# Patient Record
Sex: Male | Born: 1945
Health system: Southern US, Community
[De-identification: ages and names within clinical notes are randomized; demographics above are authoritative.]

## PROBLEM LIST (undated history)

## (undated) DIAGNOSIS — I480 Paroxysmal atrial fibrillation: Secondary | ICD-10-CM

## (undated) DIAGNOSIS — E669 Obesity, unspecified: Secondary | ICD-10-CM

## (undated) DIAGNOSIS — K219 Gastro-esophageal reflux disease without esophagitis: Secondary | ICD-10-CM

## (undated) DIAGNOSIS — C4490 Unspecified malignant neoplasm of skin, unspecified: Secondary | ICD-10-CM

## (undated) DIAGNOSIS — I509 Heart failure, unspecified: Secondary | ICD-10-CM

## (undated) DIAGNOSIS — I10 Essential (primary) hypertension: Secondary | ICD-10-CM

## (undated) DIAGNOSIS — E785 Hyperlipidemia, unspecified: Secondary | ICD-10-CM

## (undated) DIAGNOSIS — H269 Unspecified cataract: Secondary | ICD-10-CM

## (undated) DIAGNOSIS — T7840XA Allergy, unspecified, initial encounter: Secondary | ICD-10-CM

## (undated) DIAGNOSIS — G4733 Obstructive sleep apnea (adult) (pediatric): Secondary | ICD-10-CM

## (undated) DIAGNOSIS — Z9989 Dependence on other enabling machines and devices: Secondary | ICD-10-CM

## (undated) DIAGNOSIS — J449 Chronic obstructive pulmonary disease, unspecified: Secondary | ICD-10-CM

## (undated) DIAGNOSIS — K5790 Diverticulosis of intestine, part unspecified, without perforation or abscess without bleeding: Secondary | ICD-10-CM

## (undated) DIAGNOSIS — G4730 Sleep apnea, unspecified: Secondary | ICD-10-CM

## (undated) DIAGNOSIS — K5150 Left sided colitis without complications: Secondary | ICD-10-CM

## (undated) HISTORY — PX: SKIN CANCER EXCISION: SHX779

## (undated) HISTORY — DX: Obstructive sleep apnea (adult) (pediatric): G47.33

## (undated) HISTORY — DX: Allergy, unspecified, initial encounter: T78.40XA

## (undated) HISTORY — DX: Dependence on other enabling machines and devices: Z99.89

## (undated) HISTORY — DX: Left sided colitis without complications: K51.50

## (undated) HISTORY — DX: Essential (primary) hypertension: I10

## (undated) HISTORY — DX: Hyperlipidemia, unspecified: E78.5

## (undated) HISTORY — PX: UMBILICAL HERNIA REPAIR: SHX196

## (undated) HISTORY — DX: Unspecified malignant neoplasm of skin, unspecified: C44.90

## (undated) HISTORY — PX: POLYPECTOMY: SHX149

## (undated) HISTORY — PX: OTHER SURGICAL HISTORY: SHX169

## (undated) HISTORY — DX: Gastro-esophageal reflux disease without esophagitis: K21.9

## (undated) HISTORY — DX: Paroxysmal atrial fibrillation: I48.0

## (undated) HISTORY — DX: Sleep apnea, unspecified: G47.30

## (undated) HISTORY — DX: Diverticulosis of intestine, part unspecified, without perforation or abscess without bleeding: K57.90

## (undated) HISTORY — DX: Chronic obstructive pulmonary disease, unspecified: J44.9

## (undated) HISTORY — DX: Unspecified cataract: H26.9

## (undated) HISTORY — DX: Obesity, unspecified: E66.9

## (undated) HISTORY — PX: TONSILLECTOMY: SUR1361

## (undated) HISTORY — PX: BELPHAROPTOSIS REPAIR: SHX369

## (undated) HISTORY — PX: VASECTOMY: SHX75

---

## 1997-11-21 ENCOUNTER — Ambulatory Visit: Admission: RE | Admit: 1997-11-21 | Discharge: 1997-11-21 | Payer: Self-pay

## 1999-02-18 ENCOUNTER — Encounter: Payer: Self-pay | Admitting: Surgery

## 1999-02-18 ENCOUNTER — Encounter: Admission: RE | Admit: 1999-02-18 | Discharge: 1999-02-18 | Payer: Self-pay | Admitting: Surgery

## 1999-02-19 ENCOUNTER — Ambulatory Visit (HOSPITAL_BASED_OUTPATIENT_CLINIC_OR_DEPARTMENT_OTHER): Admission: RE | Admit: 1999-02-19 | Discharge: 1999-02-19 | Payer: Self-pay | Admitting: Surgery

## 1999-04-27 HISTORY — PX: FETAL SURGERY FOR CONGENITAL HERNIA: SHX1618

## 1999-11-15 ENCOUNTER — Ambulatory Visit (HOSPITAL_BASED_OUTPATIENT_CLINIC_OR_DEPARTMENT_OTHER): Admission: RE | Admit: 1999-11-15 | Discharge: 1999-11-15 | Payer: Self-pay | Admitting: Otolaryngology

## 2002-07-20 ENCOUNTER — Ambulatory Visit (HOSPITAL_COMMUNITY): Admission: RE | Admit: 2002-07-20 | Discharge: 2002-07-20 | Payer: Self-pay | Admitting: *Deleted

## 2003-04-27 DIAGNOSIS — I480 Paroxysmal atrial fibrillation: Secondary | ICD-10-CM

## 2003-04-27 HISTORY — DX: Paroxysmal atrial fibrillation: I48.0

## 2003-04-27 HISTORY — PX: BACK SURGERY: SHX140

## 2003-11-08 ENCOUNTER — Inpatient Hospital Stay (HOSPITAL_COMMUNITY): Admission: AC | Admit: 2003-11-08 | Discharge: 2003-12-03 | Payer: Self-pay

## 2003-11-21 ENCOUNTER — Encounter: Payer: Self-pay | Admitting: Cardiology

## 2003-11-26 ENCOUNTER — Encounter (INDEPENDENT_AMBULATORY_CARE_PROVIDER_SITE_OTHER): Payer: Self-pay | Admitting: Specialist

## 2003-12-03 ENCOUNTER — Inpatient Hospital Stay (HOSPITAL_COMMUNITY)
Admission: RE | Admit: 2003-12-03 | Discharge: 2003-12-11 | Payer: Self-pay | Admitting: Physical Medicine & Rehabilitation

## 2004-03-13 ENCOUNTER — Ambulatory Visit: Payer: Self-pay | Admitting: Cardiology

## 2004-05-14 ENCOUNTER — Encounter: Admission: RE | Admit: 2004-05-14 | Discharge: 2004-05-14 | Payer: Self-pay | Admitting: Family Medicine

## 2004-10-02 ENCOUNTER — Ambulatory Visit: Payer: Self-pay | Admitting: Cardiology

## 2006-08-03 ENCOUNTER — Encounter: Admission: RE | Admit: 2006-08-03 | Discharge: 2006-08-03 | Payer: Self-pay | Admitting: Family Medicine

## 2007-03-08 ENCOUNTER — Ambulatory Visit: Payer: Self-pay | Admitting: Internal Medicine

## 2007-03-15 ENCOUNTER — Ambulatory Visit: Payer: Self-pay

## 2007-03-15 LAB — CONVERTED CEMR LAB
ALT: 20 units/L (ref 0–53)
CO2: 29 meq/L (ref 19–32)
CRP, High Sensitivity: 6 — ABNORMAL HIGH (ref 0.00–5.00)
Calcium: 8.8 mg/dL (ref 8.4–10.5)
Chloride: 105 meq/L (ref 96–112)
Cholesterol: 201 mg/dL (ref 0–200)
Creatinine, Ser: 0.9 mg/dL (ref 0.4–1.5)
Direct LDL: 129.3 mg/dL
GFR calc Af Amer: 111 mL/min
GFR calc non Af Amer: 92 mL/min
Glucose, Bld: 104 mg/dL — ABNORMAL HIGH (ref 70–99)
Sodium: 140 meq/L (ref 135–145)
Total CHOL/HDL Ratio: 6.9
Total Protein: 7.1 g/dL (ref 6.0–8.3)
Triglycerides: 261 mg/dL (ref 0–149)
VLDL: 52 mg/dL — ABNORMAL HIGH (ref 0–40)

## 2007-03-16 ENCOUNTER — Ambulatory Visit: Payer: Self-pay | Admitting: Internal Medicine

## 2007-03-16 ENCOUNTER — Encounter: Payer: Self-pay | Admitting: Emergency Medicine

## 2007-03-20 ENCOUNTER — Telehealth (INDEPENDENT_AMBULATORY_CARE_PROVIDER_SITE_OTHER): Payer: Self-pay | Admitting: *Deleted

## 2007-05-04 ENCOUNTER — Ambulatory Visit: Payer: Self-pay | Admitting: Internal Medicine

## 2007-05-12 ENCOUNTER — Ambulatory Visit: Payer: Self-pay | Admitting: Internal Medicine

## 2007-05-12 LAB — CONVERTED CEMR LAB
BUN: 16 mg/dL (ref 6–23)
CO2: 34 meq/L — ABNORMAL HIGH (ref 19–32)
Creatinine, Ser: 1.2 mg/dL (ref 0.4–1.5)
GFR calc non Af Amer: 66 mL/min
Sodium: 140 meq/L (ref 135–145)

## 2007-06-21 ENCOUNTER — Ambulatory Visit: Payer: Self-pay | Admitting: Internal Medicine

## 2007-08-25 ENCOUNTER — Ambulatory Visit: Payer: Self-pay | Admitting: Internal Medicine

## 2007-08-28 ENCOUNTER — Ambulatory Visit: Payer: Self-pay | Admitting: Internal Medicine

## 2007-08-28 LAB — CONVERTED CEMR LAB
ALT: 24 units/L (ref 0–53)
AST: 22 units/L (ref 0–37)
Albumin: 3.8 g/dL (ref 3.5–5.2)
Alkaline Phosphatase: 39 units/L (ref 39–117)
BUN: 22 mg/dL (ref 6–23)
Bilirubin, Direct: 0.1 mg/dL (ref 0.0–0.3)
CRP, High Sensitivity: 5 (ref 0.00–5.00)
Calcium: 9 mg/dL (ref 8.4–10.5)
Chloride: 105 meq/L (ref 96–112)
Cholesterol: 143 mg/dL (ref 0–200)
Creatinine, Ser: 1.1 mg/dL (ref 0.4–1.5)
GFR calc Af Amer: 88 mL/min
Glucose, Bld: 104 mg/dL — ABNORMAL HIGH (ref 70–99)
Potassium: 3.9 meq/L (ref 3.5–5.1)
Total Bilirubin: 0.8 mg/dL (ref 0.3–1.2)
Total Protein: 6.8 g/dL (ref 6.0–8.3)
VLDL: 32 mg/dL (ref 0–40)

## 2007-09-20 ENCOUNTER — Encounter: Admission: RE | Admit: 2007-09-20 | Discharge: 2007-09-20 | Payer: Self-pay | Admitting: Family Medicine

## 2007-10-04 ENCOUNTER — Encounter: Admission: RE | Admit: 2007-10-04 | Discharge: 2007-10-04 | Payer: Self-pay | Admitting: Family Medicine

## 2007-10-31 ENCOUNTER — Ambulatory Visit (HOSPITAL_BASED_OUTPATIENT_CLINIC_OR_DEPARTMENT_OTHER): Admission: RE | Admit: 2007-10-31 | Discharge: 2007-10-31 | Payer: Self-pay | Admitting: Orthopedic Surgery

## 2008-01-03 ENCOUNTER — Ambulatory Visit: Payer: Self-pay | Admitting: Internal Medicine

## 2008-01-04 ENCOUNTER — Ambulatory Visit: Payer: Self-pay | Admitting: Internal Medicine

## 2008-01-04 LAB — CONVERTED CEMR LAB
AST: 15 units/L (ref 0–37)
BUN: 22 mg/dL (ref 6–23)
CRP, High Sensitivity: 7 — ABNORMAL HIGH (ref 0.00–5.00)
Cholesterol: 138 mg/dL (ref 0–200)
Creatinine, Ser: 1.1 mg/dL (ref 0.4–1.5)
GFR calc non Af Amer: 73 mL/min
HDL: 27.2 mg/dL — ABNORMAL LOW (ref >39.0)
Total Protein: 6.9 g/dL (ref 6.0–8.3)
Triglycerides: 123 mg/dL (ref 0–149)
VLDL: 25 mg/dL (ref 0–40)

## 2008-02-01 ENCOUNTER — Ambulatory Visit: Payer: Self-pay | Admitting: Emergency Medicine

## 2008-02-01 DIAGNOSIS — I4891 Unspecified atrial fibrillation: Secondary | ICD-10-CM | POA: Insufficient documentation

## 2008-02-01 DIAGNOSIS — G4733 Obstructive sleep apnea (adult) (pediatric): Secondary | ICD-10-CM | POA: Insufficient documentation

## 2008-02-01 DIAGNOSIS — I10 Essential (primary) hypertension: Secondary | ICD-10-CM | POA: Insufficient documentation

## 2008-02-01 DIAGNOSIS — J449 Chronic obstructive pulmonary disease, unspecified: Secondary | ICD-10-CM | POA: Insufficient documentation

## 2008-02-01 DIAGNOSIS — E785 Hyperlipidemia, unspecified: Secondary | ICD-10-CM | POA: Insufficient documentation

## 2008-02-01 DIAGNOSIS — J309 Allergic rhinitis, unspecified: Secondary | ICD-10-CM | POA: Insufficient documentation

## 2008-03-06 ENCOUNTER — Ambulatory Visit: Payer: Self-pay | Admitting: Internal Medicine

## 2008-03-06 LAB — CONVERTED CEMR LAB
ALT: 25 units/L (ref 0–53)
AST: 16 units/L (ref 0–37)
Albumin: 3.7 g/dL (ref 3.5–5.2)
Alkaline Phosphatase: 47 units/L (ref 39–117)
Bilirubin, Direct: 0.1 mg/dL (ref 0.0–0.3)
HDL: 35.7 mg/dL — ABNORMAL LOW (ref >39.0)
LDL Cholesterol: 84 mg/dL (ref 0–99)
Total Bilirubin: 0.9 mg/dL (ref 0.3–1.2)
Total Protein: 7 g/dL (ref 6.0–8.3)
VLDL: 23 mg/dL (ref 0–40)

## 2008-03-13 ENCOUNTER — Ambulatory Visit: Payer: Self-pay | Admitting: Emergency Medicine

## 2008-05-15 ENCOUNTER — Ambulatory Visit: Payer: Self-pay | Admitting: Internal Medicine

## 2008-05-15 LAB — CONVERTED CEMR LAB
ALT: 29 units/L (ref 0–53)
Cholesterol: 147 mg/dL (ref 0–200)
HDL: 42.5 mg/dL (ref >39.0)
Total Protein: 6.9 g/dL (ref 6.0–8.3)
Triglycerides: 105 mg/dL (ref 0–149)
VLDL: 21 mg/dL (ref 0–40)

## 2008-05-31 ENCOUNTER — Encounter: Admission: RE | Admit: 2008-05-31 | Discharge: 2008-05-31 | Payer: Self-pay | Admitting: Dermatology

## 2008-10-07 ENCOUNTER — Encounter: Payer: Self-pay | Admitting: Internal Medicine

## 2008-11-19 ENCOUNTER — Ambulatory Visit: Payer: Self-pay | Admitting: Internal Medicine

## 2008-11-19 DIAGNOSIS — R0602 Shortness of breath: Secondary | ICD-10-CM | POA: Insufficient documentation

## 2009-02-10 ENCOUNTER — Encounter: Admission: RE | Admit: 2009-02-10 | Discharge: 2009-02-10 | Payer: Self-pay | Admitting: Gastroenterology

## 2009-03-07 ENCOUNTER — Encounter: Admission: RE | Admit: 2009-03-07 | Discharge: 2009-03-07 | Payer: Self-pay | Admitting: Gastroenterology

## 2009-03-17 ENCOUNTER — Telehealth: Payer: Self-pay | Admitting: Internal Medicine

## 2009-04-08 ENCOUNTER — Encounter: Payer: Self-pay | Admitting: Internal Medicine

## 2009-04-22 ENCOUNTER — Telehealth (INDEPENDENT_AMBULATORY_CARE_PROVIDER_SITE_OTHER): Payer: Self-pay | Admitting: *Deleted

## 2009-05-09 ENCOUNTER — Ambulatory Visit: Payer: Self-pay | Admitting: Emergency Medicine

## 2009-05-12 ENCOUNTER — Telehealth: Payer: Self-pay | Admitting: Emergency Medicine

## 2009-05-29 ENCOUNTER — Ambulatory Visit: Payer: Self-pay | Admitting: Internal Medicine

## 2009-10-08 ENCOUNTER — Encounter: Payer: Self-pay | Admitting: Internal Medicine

## 2009-10-16 ENCOUNTER — Encounter: Admission: RE | Admit: 2009-10-16 | Discharge: 2009-10-16 | Payer: Self-pay | Admitting: Gastroenterology

## 2009-10-27 ENCOUNTER — Inpatient Hospital Stay (HOSPITAL_COMMUNITY): Admission: EM | Admit: 2009-10-27 | Discharge: 2009-11-01 | Payer: Self-pay | Admitting: Emergency Medicine

## 2009-11-10 ENCOUNTER — Ambulatory Visit: Payer: Self-pay | Admitting: Emergency Medicine

## 2009-11-24 ENCOUNTER — Ambulatory Visit: Payer: Self-pay | Admitting: Emergency Medicine

## 2010-02-02 ENCOUNTER — Ambulatory Visit: Payer: Self-pay | Admitting: Emergency Medicine

## 2010-03-01 ENCOUNTER — Encounter: Payer: Self-pay | Admitting: Emergency Medicine

## 2010-03-03 ENCOUNTER — Telehealth: Payer: Self-pay | Admitting: Emergency Medicine

## 2010-03-18 ENCOUNTER — Ambulatory Visit: Payer: Self-pay | Admitting: Internal Medicine

## 2010-03-18 DIAGNOSIS — R0789 Other chest pain: Secondary | ICD-10-CM | POA: Insufficient documentation

## 2010-03-23 ENCOUNTER — Ambulatory Visit: Payer: Self-pay | Admitting: Internal Medicine

## 2010-03-23 DIAGNOSIS — R072 Precordial pain: Secondary | ICD-10-CM | POA: Insufficient documentation

## 2010-03-23 DIAGNOSIS — E7800 Pure hypercholesterolemia, unspecified: Secondary | ICD-10-CM | POA: Insufficient documentation

## 2010-03-31 LAB — CONVERTED CEMR LAB
AST: 21 units/L (ref 0–37)
BUN: 25 mg/dL — ABNORMAL HIGH (ref 6–23)
Cholesterol: 164 mg/dL (ref 0–200)
GFR calc non Af Amer: 70.38 mL/min (ref >60)
LDL Cholesterol: 85 mg/dL (ref 0–99)
Potassium: 4 meq/L (ref 3.5–5.1)
Sodium: 142 meq/L (ref 135–145)
Total Bilirubin: 0.3 mg/dL (ref 0.3–1.2)
VLDL: 33.4 mg/dL (ref 0.0–40.0)

## 2010-04-13 ENCOUNTER — Ambulatory Visit: Payer: Self-pay | Admitting: Internal Medicine

## 2010-04-22 ENCOUNTER — Ambulatory Visit
Admission: RE | Admit: 2010-04-22 | Discharge: 2010-04-22 | Payer: Self-pay | Source: Home / Self Care | Attending: Pulmonary Disease | Admitting: Pulmonary Disease

## 2010-04-22 DIAGNOSIS — J209 Acute bronchitis, unspecified: Secondary | ICD-10-CM | POA: Insufficient documentation

## 2010-04-23 ENCOUNTER — Telehealth: Payer: Self-pay | Admitting: Emergency Medicine

## 2010-05-07 ENCOUNTER — Ambulatory Visit
Admission: RE | Admit: 2010-05-07 | Discharge: 2010-05-07 | Payer: Self-pay | Source: Home / Self Care | Attending: Internal Medicine | Admitting: Internal Medicine

## 2010-05-28 ENCOUNTER — Telehealth: Payer: Self-pay | Admitting: Internal Medicine
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
## 2010-05-28 NOTE — Progress Notes (Signed)
Summary: prescript  Phone Note Call from Patient Call back at [phone]   Caller: Patient Call For: Zeah Germano Summary of Call: need prescript for symbicort written for 90 days and 3 refills wrong quanity was written [facility] Initial call taken by: Gustavus Bryant,  May 12, 2009 10:05 AM  Follow-up for Phone Call        called and spoke to pharmacists and they had entered rx as #1 inhaler with 3 refills. I advised this was incorrect and that it was written #3x3 refills. they have fixed this and the pt has been advised to go and pick up additional inhalers. [place] Bing CMA  May 12, 2009 10:23 AM

## 2010-05-28 NOTE — Miscellaneous (Signed)
Summary: Orders Update   Clinical Lists Changes  Orders: Added new Referral order of DME Referral (DME) - Signed  Appended Document: Orders Update     Clinical Lists Changes  Orders: Added new Test order of T-2 View CXR ([id number]) - Signed

## 2010-05-28 NOTE — Progress Notes (Signed)
Summary: RESULTS - LMTCB x1  Phone Note Call from Patient   Caller: Patient Call For: BYRUM Summary of Call: [place] PAP TEST RESULTS TO DETERMINE PRESSURE. Initial call taken by: Gustavus Bryant,  March 03, 2010 11:44 AM  Follow-up for Phone Call        [facility] TCB x1.  when was titration done? Parke Poisson CNA/MA  March 03, 2010 12:06 PM   Titration was done the week of 02-02-10. Please advsie if you have results of this download.[place] Bing CMA  March 04, 2010 10:53 AM  Pt's titration study confirms new pressure should be 11cmH2O, will order this now. Collene Gobble MD  March 05, 2010 5:48 PM   Follow-up by: Collene Gobble MD,  March 05, 2010 5:48 PM  Additional Follow-up for Phone Call Additional follow up Details #1::        LMOMTCB.Francesca Jewett [facility]  March 06, 2010 8:46 AM   pt returned the call and he is aware per RB of results of the titration study--he is also aware that [facility] will be contacting him  Elita Boone CMA  March 06, 2010 1:59 PM

## 2010-05-28 NOTE — Progress Notes (Signed)
Summary: CXR results  Phone Note Call from Patient Call back at cell [phone]   Caller: Patient Summary of Call: Pt requesting results on CXR from yesterday when seen by RA. I advised pt RA is out of the office but RB will here this PM and can review same. Please advise. Thanks. Iran Planas CMA  April 23, 2010 12:40 PM   Follow-up for Phone Call        Please notify pt that his CXR shows changes consistent with COPD, no evidence for PNA or any other acute changes. Collene Gobble MD  April 23, 2010 2:35 PM  Follow-up by: Collene Gobble MD,  April 23, 2010 2:35 PM  Additional Follow-up for Phone Call Additional follow up Details #1::        called and spoke with pt and he is aware of his cxr results Elita Boone [facility]  April 23, 2010 2:52 PM
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
## 2010-06-02 ENCOUNTER — Encounter: Payer: Self-pay | Admitting: Adult Health

## 2010-06-02 ENCOUNTER — Ambulatory Visit (INDEPENDENT_AMBULATORY_CARE_PROVIDER_SITE_OTHER): Payer: 59 | Admitting: Adult Health

## 2010-06-02 DIAGNOSIS — J449 Chronic obstructive pulmonary disease, unspecified: Secondary | ICD-10-CM

## 2010-06-02 DIAGNOSIS — J209 Acute bronchitis, unspecified: Secondary | ICD-10-CM

## 2010-06-03 NOTE — Progress Notes (Signed)
Summary: rx refill   Phone Note Refill Request Call back at [phone] Message from:  Patient on May 28, 2010 8:43 AM  Refills Requested: Medication #1:  LIPITOR 40 MG TABS Take one tablet by mouth daily. pt wants a 90days supply.   Method Requested: Telephone to Pharmacy    Prescriptions: LIPITOR 40 MG TABS (ATORVASTATIN CALCIUM) Take one tablet by mouth daily.  #90 x 2   Entered by:   Mignon Pine, RMA   Authorized by:   Jolaine Artist, MD, [facility]   Signed by:   Mignon Pine, RMA on 05/28/2010   Method used:   [facility]        [facility] [address] [phone]* (retail)       [address]       [place], [place]  [address]       Ph: [phone] or [phone]       Fax: [phone]   RxID:   [id number]

## 2010-06-04 ENCOUNTER — Encounter: Payer: Self-pay | Admitting: Emergency Medicine
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
## 2010-06-23 NOTE — Letter (Signed)
Summary: Laurence Spates MD/[facility] Kerrie Pleasure MD/[facility]   Imported By: Bubba Hales 06/17/2010 09:26:32  _____________________________________________________________________  External Attachment:    Type:   Image     Comment:   External Document

## 2010-06-30 ENCOUNTER — Encounter: Payer: Self-pay | Admitting: Emergency Medicine

## 2010-06-30 ENCOUNTER — Ambulatory Visit (INDEPENDENT_AMBULATORY_CARE_PROVIDER_SITE_OTHER): Payer: 59 | Admitting: Emergency Medicine

## 2010-06-30 DIAGNOSIS — J449 Chronic obstructive pulmonary disease, unspecified: Secondary | ICD-10-CM

## 2010-06-30 DIAGNOSIS — G4730 Sleep apnea, unspecified: Secondary | ICD-10-CM
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
## 2010-07-12 LAB — CBC
HCT: 35.6 % — ABNORMAL LOW (ref 39.0–52.0)
HCT: 38.1 % — ABNORMAL LOW (ref 39.0–52.0)
Hemoglobin: 12 g/dL — ABNORMAL LOW (ref 13.0–17.0)
Hemoglobin: 12.2 g/dL — ABNORMAL LOW (ref 13.0–17.0)
Hemoglobin: 13 g/dL (ref 13.0–17.0)
Hemoglobin: 14.7 g/dL (ref 13.0–17.0)
MCH: 32 pg (ref 26.0–34.0)
MCH: 32.1 pg (ref 26.0–34.0)
MCH: 32.4 pg (ref 26.0–34.0)
MCH: 33.3 pg (ref 26.0–34.0)
MCHC: 33.8 g/dL (ref 30.0–36.0)
MCHC: 34 g/dL (ref 30.0–36.0)
MCHC: 34.4 g/dL (ref 30.0–36.0)
MCHC: 35.6 g/dL (ref 30.0–36.0)
MCV: 93.5 fL (ref 78.0–100.0)
MCV: 94.2 fL (ref 78.0–100.0)
Platelets: 178 10*3/uL (ref 150–400)
Platelets: 201 10*3/uL (ref 150–400)
RBC: 3.78 MIL/uL — ABNORMAL LOW (ref 4.22–5.81)
RBC: 3.96 MIL/uL — ABNORMAL LOW (ref 4.22–5.81)
RBC: 3.98 MIL/uL — ABNORMAL LOW (ref 4.22–5.81)
RDW: 14.4 % (ref 11.5–15.5)
RDW: 14.4 % (ref 11.5–15.5)
WBC: 13.9 10*3/uL — ABNORMAL HIGH (ref 4.0–10.5)

## 2010-07-12 LAB — BASIC METABOLIC PANEL
BUN: 10 mg/dL (ref 6–23)
BUN: 15 mg/dL (ref 6–23)
CO2: 25 mEq/L (ref 19–32)
CO2: 28 mEq/L (ref 19–32)
CO2: 30 mEq/L (ref 19–32)
Calcium: 8.3 mg/dL — ABNORMAL LOW (ref 8.4–10.5)
Calcium: 8.5 mg/dL (ref 8.4–10.5)
Calcium: 8.7 mg/dL (ref 8.4–10.5)
Calcium: 8.8 mg/dL (ref 8.4–10.5)
Creatinine, Ser: 0.86 mg/dL (ref 0.4–1.5)
Creatinine, Ser: 0.91 mg/dL (ref 0.4–1.5)
GFR calc Af Amer: >60 mL/min (ref >60)
GFR calc Af Amer: >60 mL/min (ref >60)
GFR calc non Af Amer: >60 mL/min (ref >60)
GFR calc non Af Amer: >60 mL/min (ref >60)
Glucose, Bld: 104 mg/dL — ABNORMAL HIGH (ref 70–99)
Glucose, Bld: 112 mg/dL — ABNORMAL HIGH (ref 70–99)
Glucose, Bld: 181 mg/dL — ABNORMAL HIGH (ref 70–99)
Potassium: 3.7 mEq/L (ref 3.5–5.1)
Sodium: 139 mEq/L (ref 135–145)
Sodium: 139 mEq/L (ref 135–145)

## 2010-07-12 LAB — DIFFERENTIAL
Basophils Absolute: 0 10*3/uL (ref 0.0–0.1)
Eosinophils Absolute: 0.1 10*3/uL (ref 0.0–0.7)
Eosinophils Relative: 1 % (ref 0–5)
Eosinophils Relative: 1 % (ref 0–5)
Lymphocytes Relative: 5 % — ABNORMAL LOW (ref 12–46)
Lymphocytes Relative: 6 % — ABNORMAL LOW (ref 12–46)
Lymphs Abs: 0.7 10*3/uL (ref 0.7–4.0)
Lymphs Abs: 0.8 10*3/uL (ref 0.7–4.0)
Monocytes Absolute: 0.5 10*3/uL (ref 0.1–1.0)
Monocytes Absolute: 0.7 10*3/uL (ref 0.1–1.0)
Monocytes Relative: 3 % (ref 3–12)
Monocytes Relative: 4 % (ref 3–12)
Monocytes Relative: 4 % (ref 3–12)
Neutro Abs: 15.4 10*3/uL — ABNORMAL HIGH (ref 1.7–7.7)
Neutrophils Relative %: 89 % — ABNORMAL HIGH (ref 43–77)
Neutrophils Relative %: 90 % — ABNORMAL HIGH (ref 43–77)
Neutrophils Relative %: 90 % — ABNORMAL HIGH (ref 43–77)
WBC Morphology: INCREASED

## 2010-07-12 LAB — CULTURE, BLOOD (ROUTINE X 2): Culture: NO GROWTH

## 2010-07-12 LAB — PROCALCITONIN: Procalcitonin: 0.78 ng/mL

## 2010-07-12 LAB — COMPREHENSIVE METABOLIC PANEL
AST: 32 U/L (ref 0–37)
BUN: 27 mg/dL — ABNORMAL HIGH (ref 6–23)
CO2: 24 mEq/L (ref 19–32)
Calcium: 8.5 mg/dL (ref 8.4–10.5)
Creatinine, Ser: 1.29 mg/dL (ref 0.4–1.5)
GFR calc Af Amer: >60 mL/min (ref >60)
GFR calc non Af Amer: 56 mL/min — ABNORMAL LOW (ref >60)
Glucose, Bld: 135 mg/dL — ABNORMAL HIGH (ref 70–99)

## 2010-07-12 LAB — LACTIC ACID, PLASMA: Lactic Acid, Venous: 1.8 mmol/L (ref 0.5–2.2)

## 2010-07-12 LAB — VANCOMYCIN, TROUGH: Vancomycin Tr: 14.1 ug/mL (ref 10.0–20.0)
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
## 2010-09-08 NOTE — Op Note (Signed)
[name], Ricardo Cooper             ACCOUNT NO.:  [id number]   MEDICAL RECORD NO.:  [id number]          PATIENT TYPE:  AMB   LOCATION:  [place]                          FACILITY:  [facility]   PHYSICIAN:  Robert A. Noemi Chapel, M.D. DATE OF BIRTH:  01/08/1946   DATE OF PROCEDURE:  10/31/2007  DATE OF DISCHARGE:                               OPERATIVE REPORT   PREOPERATIVE DIAGNOSIS:  Left knee medial and lateral meniscal tears  with synovitis.   POSTOPERATIVE DIAGNOSIS:  Left knee medial and lateral meniscal tears  with synovitis.   PROCEDURE:  1. Left knee examination under anesthesia followed by arthroscopic      partial medial and lateral meniscectomies.  2. Left knee partial synovectomy.   SURGEON:  Audree Camel. Noemi Chapel, MD.   ASSISTANT:  None.   ANESTHESIA:  General.   OPERATIVE TIME:  30 minutes.   COMPLICATIONS:  None.   INDICATIONS FOR PROCEDURE:  Ricardo Cooper is a 65 year old gentleman who  has had 3-4 months of increasing left knee pain with examined MRI  documenting meniscal tearing with synovitis.  He has failed conservative  care and is now to undergo arthroscopy.   DESCRIPTION:  Ricardo Cooper was brought to the operating room on October 31, 2007 after a knee block was placed in holding area by anesthesia.  He  was placed on operative table in supine position.  After being placed  under general anesthesia, his left knee was examined.  He had full range  of motion and his knee was stable on ligamentous exam with normal  patellar tracking.  Left leg was prepped using sterile DuraPrep, draped  using sterile technique.  He received Ancef 1 g IV preoperatively for  prophylaxis.  Initially, through an anterolateral portal, the  arthroscope with a pump attached was placed into an anteromedial portal  and arthroscopic probe was placed.  On initial inspection of medial  compartment, the articular cartilage showed grade 1 and 2  chondromalacia.  Medial meniscus showed a tearing in the  posterior  medial horn of which 30% was resected back to stable rim.  Intercondylar  notch was inspected.  Anterior and posterior cruciate ligaments were  normal.  Lateral compartment inspected.  Articular cartilage was normal.  Lateral meniscus showed 20% tear posterolateral corner which was  resected back to stable rim.  Patellofemoral joint showed grade 1 and 2  chondromalacia.  The patella tracked normally.  Medial and lateral  gutters showed significant synovitis which was debrided and the small  synovial bleeders cauterized.  Otherwise, this was free of pathology.  At this point, it was felt that all pathology have been satisfactorily  addressed.  The instruments removed.  Portals closed with 3-0 nylon  suture and injected with 0.25% Marcaine with epinephrine and 4 mg of  morphine.  Sterile dressings were applied.  The patient awakened and  taken to recovery in stable condition.   FOLLOWUP CARE:  Ricardo Cooper will be followed as an outpatient on  Vicodin and Mobic.  He will be seen back in the office in a week for  sutures out and followup.  Robert A. Noemi Chapel, M.D.  Electronically Signed     RAW/MEDQ  D:  10/31/2007  T:  11/01/2007  Job:  [id number]

## 2010-09-08 NOTE — Assessment & Plan Note (Signed)
[name]                            CARDIOLOGY OFFICE NOTE   NAME:Ricardo Cooper, Ricardo Cooper                    MRN:          [id number]  DATE:06/21/2007                            DOB:          1946/03/05    PRIMARY CARE PHYSICIAN:  Ricardo Cooper, M.D. with [facility].   INTERVAL HISTORY:  The patient is a delightful 65 year old male, who is  the father of Ricardo Cooper in the cath lab.  He has a history of  obstructive sleep apnea on CPAP, obesity, hypertension as well as a  history of traumatic accident in 2005 with a burst fracture of L1  complicated by a sternal wound infection and atrial fibrillation which  has not recurred.   We saw him in November with shortness of breath.  He had a stress test  which was normal.  PFTs which showed moderate COPD which I suspect is  the main cause of his dyspnea.   He returns today for routine followup primarily of his blood pressure.  He says his systolic blood pressure has been running anywhere from 115-  140.  He also notes that since we had taken him off of the Toprol, he  occasionally feels his heart rate speeding up and often he has a heart  rate of 100 when he checks his blood pressure.  He has not had any  syncope or presyncope with this.  No chest pain.  He is not exercising  regularly.   CURRENT MEDICATIONS:  1. Protonix 40.  2. Multivitamin.  3. Coenzyme Q-10.  4. B12.  5. Vitamin E.  6. CPAP.  7. Simvastatin 40.  8. Lisinopril/HCTZ 20/20 12.5 a day.  9. Lialda 1.2 gm 2 tablets daily.   PHYSICAL EXAMINATION:  GENERAL:  He is in no acute distress.  He  ambulates around the clinic without any respiratory difficulty.  VITAL SIGNS:  Blood pressure is 138/86 on our machine and then 139/96 on  his machine, weight is 239, heart rate is 97.  HEENT:  Normal.  NECK:  Supple.  There is no JVD.  Carotids are 2+ bilaterally without  bruits.  There is no lymphadenopathy or thyromegaly.  CARDIAC:  He  has distant heart sounds.  He is mildly irregular with an  S4.  Soft 2/6 systolic ejection murmur at the left sternal border.  LUNGS:  Clear with mildly prolonged expiratory phases, no wheezes.  ABDOMEN:  Obese, nontender, nondistended.  No hepatosplenomegaly, no  bruits, no masses.  Good bowel sounds.  EXTREMITIES:  Warm with no cyanosis, clubbing or edema.  No rash.  NEURO:  Alert and oriented x 3.  Cranial nerves II-XII are intact.  Moves all 4 extremities without difficulty.  Affect is pleasant.   DIAGNOSTICS:  EKG shows wandering atrial pacemaker at a rate of 97.  There are occasional PVCs.  Incomplete right bundle branch block.   ASSESSMENT:  1. Hypertension.  Blood pressures are mildly elevated.  Given his      wandering atrial pacemaker on his EKG and mild tachycardia, we will      go ahead and  start him on Cardizem CD 180 mg a day.  He will      continue to follow his blood pressure closely.  2. Chronic obstructive pulmonary disease, this is moderate and stable.      Consider having him see pulmonary at some point.  3. Hyperlipidemia, this is followed by Dr. Tomi Cooper.   DISPOSITION:  Will see him back in followup in 2-3 months for routine  followup.     Ricardo Pascal. Bensimhon, MD  Electronically Signed    DRB/MedQ  DD: 06/21/2007  DT: 06/22/2007  Job #: [id number]

## 2010-09-08 NOTE — Assessment & Plan Note (Signed)
[facility]                            CARDIOLOGY OFFICE NOTE   NAME:Gavitt, MAMORU TAKESHITA                    MRN:          [id number]  DATE:05/04/2007                            DOB:          1946/03/13    PRIMARY CARE PHYSICIAN:  Dr. Rita Ohara with [facility].   INTERVAL HISTORY:  Ricardo Cooper is a delightful 65 year old male who is the  father of [name] in the cath lab.  He has a history of  obstructive sleep apnea, obesity, and hypertension, as well as a history  of traumatic accident in 2005 with a burst fracture of L1 complicated by  a sternal wound infection and atrial fibrillation which has not  recurred.   We first saw him in November with shortness of breath.  He had a stress  test which was normal.  Also had PFTs which showed moderate COPD which I  suspect is the main cause of his dyspnea.   He returns today for routine followup.  He is doing fairly well.  He  still has some mild shortness of breath, but this has not progressed.  No chest pain or lower extremity edema.   CURRENT MEDICATIONS:  1. Protonix 40 a day.  2. Multivitamin.  3. Toprol XL 100 a day.  4. Vitamin E.  5. Asacol.  6. Co-enzyme Q10.  7. CPAP.  8. Simvastatin 40 mg a day.   PHYSICAL EXAMINATION:  He is in no acute distress.  Ambulates around the  clinic without any respiratory difficulty.  Blood pressure is 152/86, heart rate is 80, weight is 240.  HEENT:  Normal.  NECK:  Supple, there is no JVD.  Carotids are 2+ bilaterally without any  bruits.  There is no lymphadenopathy or thyromegaly.  CARDIAC:  PMI is nondisplaced.  He is regular with an S4, no murmur.  LUNGS:  Clear with mildly prolonged expiratory phase, no wheezes.  ABDOMEN:  Marked central obesity, no hepatosplenomegaly or bruits  appreciated, no masses, good bowel sounds.  EXTREMITIES:  Warm with no cyanosis, clubbing, or edema.  No rash.  NEURO:  Alert and oriented x3.  Cranial nerves II-XII are  intact.  Moves  all 4 extremities without difficulty.  Affect is pleasant.   ASSESSMENT:  1. Dyspnea:  I suspect this is due primarily to chronic obstructive      pulmonary disease.  He did not have a good bronchodilator response,      so I do not think that he needs beta agonist at this time.      Continue current therapy.  2. Hypertension:  Blood pressure remains elevated.  Will wean him off      the Toprol and start him on lisinopril 20 hydrochlorothiazide 12.5.      He will keep a blood pressure log for [place], and will bring in his      cuff next month for calibration.  I suspect we will double his dose      at that time.  Will check a BMET in 1 week.  3. Hyperlipidemia and metabolic syndrome:  Encouraged him to lose  weight and exercise.  He recently started a statin, will keep close      tabs on this.  His ERP is elevated.     Shaune Pascal. Bensimhon, MD  Electronically Signed    DRB/MedQ  DD: 05/04/2007  DT: 05/04/2007  Job #: [id number]

## 2010-09-08 NOTE — Assessment & Plan Note (Signed)
[name]                            CARDIOLOGY OFFICE NOTE   NAME:Ricardo Cooper, Ricardo Cooper                    MRN:          [id number]  DATE:01/03/2008                            DOB:          [birth year]    PRIMARY CARE PHYSICIAN:  Vikki Ports, MD   INTERVAL HISTORY:  Ricardo Cooper is a very pleasant 65 year old male who  is a father of [name] in our cath lab.  He has a history of  obstructive sleep apnea on CPAP, obesity, hypertension, COPD and a  history of traumatic accident in 2005 with a burst fracture of L1  complicated by sternal wound infection and atrial fibrillation, which  has not recurred.   He returns for routine followup.  Overall, he is doing quite well.  However, he does note that he gets short of breath repeatedly with just  moderate activity.  He did have a Myoview, which was normal showed no  evidence of ischemia.  We recently started him on statin for his  hyperlipidemia.  He has been following his blood pressures and they have  been very well controlled.  He also has a history of palpitations that  are better on diltiazem.   CURRENT MEDICATIONS:  1. Diltiazem XR 180.  2. Fish oil.  3. Aspirin 81.  4. Lipitor 20.  5. Lialda 1.2 g four times a day for his colitis.  6. AndroGel.  7. Lisinopril/hydrochlorothiazide 20/12.5 a day.   OTHER MEDICATIONS:  Protonix 40 a day, multivitamin, CPAP, Coenzyme Q10,  vitamin E and B12.   PHYSICAL EXAMINATION:  GENERAL:  He is in no acute distress, ambulatory  in the clinic without any respiratory difficulty.  VITALS:  Blood pressure is 116/68, heart rate is 76, and weight is 240.  HEENT:  Normal.  NECK:  Supple and thick.  There is no JVD.  Carotids are 2+ bilaterally  without any bruits.  There is no lymphadenopathy or thyromegaly.  CARDIAC:  Regular rate and rhythm.  No murmurs, rubs, or gallops.  LUNGS:  Clear with a long expiratory phase.  No wheezes.  ABDOMEN:  Obese,  nontender, nondistended.  No hepatosplenomegaly, no  bruits, no masses.  Good bowel sounds.  EXTREMITIES:  Warm with no  cyanosis, clubbing, or edema.  No rash.  NEURO:  Alert, oriented x3.  Cranial nerves II through XII are intact.  Moves all four extremities without difficulty.  Affect is pleasant.   EKG shows normal sinus rhythm at a rate of 76 with incomplete right  bundle-branch block.   Pulmonary function test, FEV1 is 2.0 liters with an FVC of 3.98 liters,  his ratio is 50%, his DLCO is 77% of predicted.   ASSESSMENT AND PLAN:  1. Hypertension, well controlled.  2. Hyperlipidemia.  He is due for a repeat lipid panel.  We will see      how he is doing, goal LDL is less than 100.  3. Chronic obstructive pulmonary disease.  I will start him on      Spiriva, will refer to Dr. Lamonte Sakai to see if he has any other  recommendations to improve his respiratory status.     Shaune Pascal. Bensimhon, MD  Electronically Signed    DRB/MedQ  DD: 01/03/2008  DT: 01/04/2008  Job #: [id number]

## 2010-09-08 NOTE — Assessment & Plan Note (Signed)
[place]                            CARDIOLOGY OFFICE NOTE   NAME:Ricardo Cooper, Ricardo Cooper                    MRN:          [id number]  DATE:03/08/2007                            DOB:          05-Oct-1945    NEW PATIENT EVALUATION   PRIMARY CARE PHYSICIAN:  Dr. Rita Ohara in [place], [place].   REASON FOR VISIT:  Shortness of breath and history of atrial  fibrillation.   HISTORY OF PRESENT ILLNESS:  Barbarann Ehlers is a delightful 65 year old male with  a history of obstructive sleep apnea, obesity, and borderline  hypertension.  Was previously followed by Dr. Dannielle Burn.  He saw Dr. Dannielle Burn  back in 2005 when he fell off of a building and had multiple traumatic  injuries, including a burst fracture of L1.  His postoperative course  was complicated by atrial fibrillation with rapid ventricular response.  He was started on Toprol.  This has not recurred.   Over the past few years, he is doing well.  He notes, however, that he  is getting dyspnea with just moderate exertion, such as going up a few  flights of steps.  This is somewhat chronic, but he thinks it may have  progressed just a little bit.  He has not had any chest pain, orthopnea.  No PND.  He previously smoked for about 30 years, but quit several years  ago.   REVIEW OF SYSTEMS:  He denies any significant palpitations.  He has had  some bright red blood per rectum and mucus, and underwent colonoscopy,  and recently started on Asacol.  He has some arthritis pain and pain and  numbness in his legs from his previous trauma.  No focal neurologic  defects.  No fevers or chills.  He does sleep with CPAP for his sleep  apnea.  The remainder of the review of systems is negative, except as  previously noted on the HPI and problem list.   PROBLEM LIST:  1. History of postoperative atrial fibrillation, now in sinus rhythm.  2. History of polytrauma back in 2005 including a burst fracture of      his L1 and  complicated by a sternal wound infection.  3. Obesity.  4. Borderline hyperlipidemia.  5. Borderline obstructive sleep apnea on CPAP.  6. Allergies.   CURRENT MEDICATIONS:  1. Protonix 40 a day.  2. Multivitamin.  3. Toprol XL 100 mg a day.  4. Vitamin E.  5. Asacol 800 t.i.d.  6. Coenzyme Q-10.  7. Vitamin B12.   ALLERGIES:  No known drug allergies.   SOCIAL HISTORY:  He is divorced.  His daughter is [name] at the  [employer].  He has a history of tobacco for 30 years, quit  just a few years ago.  He currently works for the [employer]   FAMILY HISTORY:  Father died in 2023-01-07 of this past year at age 64.  No  history of coronary artery disease.  He did have atrial fibrillation.  Mother is 2 and she is in the throes of dementia.  He has 1  sister who  is 53 with multiple health problems, but no coronary artery disease.   PHYSICAL EXAM:  He is in no acute distress.  Ambulates around the clinic  without any respiratory difficulty.  Blood pressure 134/82, heart rate 66, weight 241.  HEENT:  Normal.  NECK:  Supple.  No JVD.  Carotids are 2+ bilaterally without any bruits.  There is no lymphadenopathy or thyromegaly.  CARDIAC:  PMI is nondisplaced.  He has a regular rate and rhythm.  No  murmurs, rubs, or gallops.  LUNGS:  Clear with a mildly to moderately prolonged expiratory phase,  consistent with emphysema.  ABDOMEN:  Marked central obesity with no hepatosplenomegaly.  No bruits.  No masses.  Good bowel sounds.  EXTREMITIES:  Warm with no cyanosis, clubbing, or edema.  DP pulses are  2+ in the left and not palpable on the right.  No rash.  NEURO:  He is alert and oriented x3.  Cranial nerves 2-12 are intact.  Moves all 4 extremities without difficulty.  Affect is pleasant.   EKG shows normal sinus rhythm at a rate of 67.  No significant ST-T wave  abnormalities.  He has an incomplete right bundle branch block at 112  ms.    ASSESSMENT:  1. Dyspnea.  I suspect this is predominantly due to chronic      obstructive pulmonary disease, but he does have multiple cardiac      risk factors, and we will go ahead and get a treadmill Myoview to      further evaluate.  I have also asked him to start a baby aspirin.  2. Atrial fibrillation.  This was postoperative and has not occurred.      It does not need further therapy.  3. Hypertension.  Blood pressure is mildly to moderately elevated.  I      suspect he has clear metabolic syndrome.  He will follow up with      Dr. Tomi Bamberger.  4. Probable metabolic syndrome.  We will check high sensitivity CRP as      well as his lipids.  Based on the results of the recent JUPITER      trial, even if his lipids are normal if his CRT is elevated, he      probably would benefit from a Statin for primary prevention and is      getting his LDL down.  5. Probable peripheral arterial disease by exam.  He denies any      claudication.  We have discussed following up with an abdominal      ultrasound and lower extremity ABI, and will consider this in the      future.   DISPOSITION:  We will see him back in several weeks.  I did urge him to  try to be more active.     Shaune Pascal. Bensimhon, MD  Electronically Signed    DRB/MedQ  DD: 03/08/2007  DT: 03/09/2007  Job #: [id number]   cc:   Vikki Ports, M.D.

## 2010-09-11 NOTE — Consult Note (Signed)
Ricardo Cooper, Ricardo Cooper                         ACCOUNT NO.:  [id number]   MEDICAL RECORD NO.:  [id number]                   PATIENT TYPE:  INP   LOCATION:  2308                                 FACILITY:  [facility]   PHYSICIAN:  Marchia Meiers. Vertell Limber, M.D.               DATE OF BIRTH:  December 16, 1945   DATE OF CONSULTATION:  11/08/2003  DATE OF DISCHARGE:                                   CONSULTATION   REASON FOR CONSULTATION:  L1 burst fracture.   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  He complained of right shoulder, chest pain, and shortness  of breath.  He was taken to the [facility] and emergency room chest  x-ray and CT demonstrated right first through twelfth rib fractures.  He  also had an L1 burst fracture which demonstrates 60% canal compromise and  30% vertebral body height loss with left lamina fracture in addition.  There  are no step-offs.  He denies any numbness or tingling or weakness in his  lower extremities.  No problems with bowel or bladder control.  Additionally  he has a right clavicle fracture and a right pneumothorax for which a chest  tube was placed.   PAST MEDICAL HISTORY:  Significant for hypertension.  He is status post an  umbilical hernia repair.   MEDICATIONS:  He takes hydrochlorothiazide.   He has no known drug allergies.   PHYSICAL EXAMINATION:  VITAL SIGNS:  His temperature is 98.9, pulse of 100,  respiratory rate of 22, blood pressure 152/90.  GENERAL:  He is awake, alert, and fully oriented.  His speech is clear and  fluent.  Cranial nerves II-XII are intact to detail testing.  NECK:  Nontender.  EXTREMITIES:  He moves all extremities well with full power without evidence  of any weakness.  BACK AND CHEST:  He is tender at his right thoracic region and also over his  thoracolumbar junction without palpable  deformity.  There is some bruising  in the right outer chest wall.  He has a right chest tube in position.  NEUROLOGICAL:  Sensation is intact in his lower extremities including intact  rectal tone and perineal sensation.  Reflexes are 2 in the biceps, triceps,  brachioradialis; 2 at the knees, 2 at the ankles with toes downgoing to  plantar stimulation.  Neurocerebellar testing is normal.   IMPRESSION:  Ricardo Cooper is a 65 year old man with an L1 burst fracture  with canal compromise of 60% with right first through twelfth rib fractures  and with a right pneumothorax.   PLAN:  The patient will require surgical stabilization of L1 fracture when  stable from a pulmonary standpoint.  The OR is anticipated for November 10, 2003.  This was discussed with the patient and  the patient's family.  He is  to have flat bed rest with logrolling until surgery and with hourly  neurological checks.                                               Marchia Meiers. Vertell Limber, M.D.    JDS/MEDQ  D:  11/08/2003  T:  11/09/2003  Job:  [id number]

## 2010-09-11 NOTE — Consult Note (Signed)
NAMEKASSIUS, BATTISTE NO.:  [id number]   MEDICAL RECORD NO.:  [id number]                   PATIENT TYPE:  INP   LOCATION:  3103                                 FACILITY:  [facility]   PHYSICIAN:  Crissie Reese, M.D.                  DATE OF BIRTH:  19-Feb-1946   DATE OF CONSULTATION:  11/29/2003  DATE OF DISCHARGE:                                   CONSULTATION   REQUESTING PHYSICIAN:  Dr. Nicanor Alcon.   CHIEF COMPLAINT:  Open wound of the neck.   HISTORY OF PRESENT ILLNESS:  A 65 year old man, who is a terminal patient,  who fell off his roof and suffered multiple injuries.  He has gradually  progressed.  Unfortunately, he had TNA delivered through a PICC line that  actually exited from the superior vena cava and he had an infiltration  injury of his neck.  This was drained 3 days ago by Dr. Arlyce Dice.  He has been  on dry dressings to the wound since then.  Opinion requested regarding wound  management at this point.   PAST MEDICAL HISTORY:  Past medical history is positive for hypertension,  otherwise negative.   PHYSICAL EXAMINATION:  INTEGUMENT:  Physical examination is limited to the  integument.  He has an open wound of his neck in the suprasternal area.  It  is several centimeters deep.  There is a fair amount of exudate in the  wound.  There is no evidence of any loculation or any residual abscess.  There is no evidence of any cellulitis.   RECOMMENDATION:  The recommendation would be saline dressings 3 times a day.  Possibly begin a V.A.C. at some point, although this wound likely will close  without any use of a V.A.C.  The wound would be repaired more quickly if  saline dressings were utilized.  We will recheck next week.                                               Crissie Reese, M.D.    DB/MEDQ  D:  11/29/2003  T:  11/30/2003  Job:  [id number]   cc:   Nicanor Alcon, M.D.  [address]  [place]  [place] [address]

## 2010-09-11 NOTE — Discharge Summary (Signed)
Ricardo Cooper, Ricardo Cooper                         ACCOUNT NO.:  [id number]   MEDICAL RECORD NO.:  [id number]                   PATIENT TYPE:  INP   LOCATION:  3024                                 FACILITY:  [facility]   PHYSICIAN:  JAMES WYATT                         DATE OF BIRTH:  [birth year]   DATE OF ADMISSION:  11/08/2003  DATE OF DISCHARGE:  12/03/2003                                 DISCHARGE SUMMARY   CONSULTING PHYSICIAN:  1.  Dr. Marchia Meiers. Vertell Limber.  2.  Dr. Nicanor Alcon.   FINAL DIAGNOSES:  1.  Fall.  2.  Multiple rib fractures on the right side.  3.  Right clavicle fracture.  4.  Thoracic and lumbar transverse process fractures.  5.  L1 burst fracture.  6.  Right pneumothorax.  7.  Extravasation of TNA into chest area.   PROCEDURES:  1.  Insertion of a chest tube in the right chest by Dr. Grandville Silos.  2.  A fusion of L1 burst fracture.  3.  I&D of chest wall extravasation site per Dr. Arlyce Dice.   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  He is complaining  of right-sided shoulder pain and right chest pain and shortness of breath  when he arrived.  A workup was performed.  A head CT and neck CT were  negative.   Chest x-ray showed rib fractures, right clavicle fracture.  A pelvis x-ray  was negative.  Chest CT was positive for multiple posterior rib fractures, T-  spine and transverse process fracture, and L1 burst fracture.  Abdominal CT  was negative.  Because of these findings, the patient was to be admitted.  Dr. Marchia Meiers. Vertell Limber was consulted for the L1 burst fracture.   HOSPITAL COURSE:  The patient was admitted to the hospital, and initially  was put in intensive care.  He was followed.  He had no neurological  deficits noted from the L1 burst fracture.  Dr. Vertell Limber saw the patient.  Subsequently, the patient was taken to the OR for fusion of L1 by Dr. Vertell Limber.  He subsequently  underwent repair of this on July 24, by Dr. Vertell Limber.  He did  develop an ileus which did resolve slowly over time.  He was __________  apparently the TNA, the PICC line that was being used in regard to this  wall, and he received TNA directly through the chest cavity.  This caused an  inflammatory reaction to the chest wall.  He was followed.  It seemed to be  worsening.  Subsequently, Dr. Arlyce Dice was consulted.  He saw the patient, and  eventually the patient was taken to the OR for an I&D of this wound area,  and wet to drys were used.  There was  talk of skin grafting.  Dr. Harlow Mares had  been consulted.  He saw the patient, and no skin grafting or back draining,  bandaging was used at that point.  There was talk of using vacuum dressing,  but I do not believe they used that.  He used wet and dry dressing.  The  patient slowly improved.  His ileus slowing resolved.  His diet was advanced  as tolerated.  He did have elevated white counts, but no fever was noted.  He had thrombocytosis but this did not appear to affect him.  He did have an  abscess culture of that area which showed a few gram-positive cocci.  He had  been on vancomycin.  He continued to progress slowly.  He was given a TLSO  brace and gotten out of bed with physical therapy.  At this point,  rehabilitation consultation was done.  He improved well enough that he was  ready for discharge to rehab on December 04, 2003.  At this point, he was  doing well and having no complaints or problems.  Dr. Harlow Mares had completed a  note that plastic surgery had seen him in consultation.  He noted the wound  was improving somewhat, and at this point, did not need to have a vacuum  dressing.  He was subsequently discharged to rehab in stable condition on  December 04, 2003.      Billey Chang, P.A.                      JAMES WYATT    CL/MEDQ  D:  01/01/2004  T:  01/01/2004  Job:  [id number]
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
## 2010-09-16 ENCOUNTER — Encounter: Payer: Self-pay | Admitting: Internal Medicine

## 2010-09-22 ENCOUNTER — Ambulatory Visit (INDEPENDENT_AMBULATORY_CARE_PROVIDER_SITE_OTHER): Payer: 59 | Admitting: Internal Medicine

## 2010-09-22 ENCOUNTER — Encounter: Payer: Self-pay | Admitting: Internal Medicine

## 2010-09-22 VITALS — BP 122/66 | Ht 71.0 in | Wt 242.1 lb

## 2010-09-22 DIAGNOSIS — I10 Essential (primary) hypertension: Secondary | ICD-10-CM

## 2010-09-22 DIAGNOSIS — E785 Hyperlipidemia, unspecified: Secondary | ICD-10-CM

## 2010-09-22 DIAGNOSIS — R0602 Shortness of breath: Secondary | ICD-10-CM
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
## 2010-09-22 NOTE — Assessment & Plan Note (Signed)
Stable. Most likely due to COPD.

## 2010-09-22 NOTE — Assessment & Plan Note (Signed)
CP resolved. ETT reassuring. Continue risk factor management. Consider cardiac CT if CP recurs.

## 2010-09-22 NOTE — Patient Instructions (Signed)
Your physician recommends that you return for lab work in: tomorrow for fasting labs Your physician recommends that you schedule a follow-up appointment in: 6 months with Dr. Gala Romney

## 2010-09-22 NOTE — Assessment & Plan Note (Signed)
Due for repeat lipids. Will check in am.

## 2010-09-22 NOTE — Assessment & Plan Note (Signed)
Blood pressure well controlled. Continue current regimen.

## 2010-09-23 ENCOUNTER — Other Ambulatory Visit (INDEPENDENT_AMBULATORY_CARE_PROVIDER_SITE_OTHER): Payer: 59 | Admitting: *Deleted

## 2010-09-23 DIAGNOSIS — E785 Hyperlipidemia, unspecified: Secondary | ICD-10-CM

## 2010-09-23 LAB — HEPATIC FUNCTION PANEL
Alkaline Phosphatase: 31 U/L — ABNORMAL LOW (ref 39–117)
Bilirubin, Direct: 0.1 mg/dL (ref 0.0–0.3)
Total Bilirubin: 0.9 mg/dL (ref 0.3–1.2)
Total Protein: 6.5 g/dL (ref 6.0–8.3)

## 2010-09-23 LAB — BASIC METABOLIC PANEL
Chloride: 107 mEq/L (ref 96–112)
Creatinine, Ser: 0.8 mg/dL (ref 0.4–1.5)
Potassium: 3.7 mEq/L (ref 3.5–5.1)

## 2010-09-23 LAB — LIPID PANEL
Cholesterol: 147 mg/dL (ref 0–200)
LDL Cholesterol: 59 mg/dL (ref 0–99)
Total CHOL/HDL Ratio: 2
VLDL: 28 mg/dL (ref 0.0–40.0)

## 2010-09-25 ENCOUNTER — Encounter: Payer: Self-pay | Admitting: *Deleted

## 2010-10-08 ENCOUNTER — Telehealth: Payer: Self-pay | Admitting: Internal Medicine

## 2010-10-08 NOTE — Telephone Encounter (Signed)
Re 2nd opinion for his colitis, said dr bensimhon was going to have nurse set it up

## 2010-10-09 NOTE — Telephone Encounter (Signed)
Per pt he will go to either [facility] or [facility], will discuss w/Dr bensimhon

## 2010-10-14 NOTE — Telephone Encounter (Signed)
I will have to call next week to arrange.

## 2010-10-14 NOTE — Telephone Encounter (Signed)
Status of appt for [facility] or [facility].

## 2010-10-14 NOTE — Telephone Encounter (Signed)
Who do you want to send him to he doesn't care if its [place] or [place]

## 2010-10-14 NOTE — Telephone Encounter (Signed)
Pt aware we are working on referral will call him back when have more info

## 2010-11-11 NOTE — Telephone Encounter (Signed)
Dr Gala Romney stated last week that he had referred pt to [facility], pt states he has not heard from them, will send to Dr Gala Romney for him to f/u

## 2010-11-13 NOTE — Telephone Encounter (Signed)
Pt is aware he will call me back if he doesn't hear from them next week

## 2010-11-13 NOTE — Telephone Encounter (Signed)
I emailed [facility] again today to f/u. Hopefully should hear back soon. They have a 2nd opinion clinic in the GI division which was supposed to contact him already. Apologize for the wait - I had contacted them over 3 weeks ago and thought it was taken care of.

## 2010-12-07 ENCOUNTER — Other Ambulatory Visit: Payer: Self-pay | Admitting: Nurse Practitioner

## 2010-12-29 ENCOUNTER — Encounter (HOSPITAL_COMMUNITY)
Admission: RE | Admit: 2010-12-29 | Discharge: 2010-12-29 | Disposition: A | Discharge status: Home / Self Care | Payer: 59 | Source: Ambulatory Visit | Attending: Gastroenterology | Admitting: Gastroenterology

## 2010-12-29 DIAGNOSIS — K5130 Ulcerative (chronic) rectosigmoiditis without complications: Secondary | ICD-10-CM | POA: Insufficient documentation

## 2011-01-12 ENCOUNTER — Ambulatory Visit: Payer: 59 | Admitting: Emergency Medicine

## 2011-01-12 ENCOUNTER — Encounter (HOSPITAL_COMMUNITY): Payer: 59

## 2011-01-14 ENCOUNTER — Encounter: Payer: Self-pay | Admitting: Emergency Medicine

## 2011-01-14 ENCOUNTER — Ambulatory Visit (INDEPENDENT_AMBULATORY_CARE_PROVIDER_SITE_OTHER): Payer: 59 | Admitting: Emergency Medicine

## 2011-01-14 DIAGNOSIS — Z23 Encounter for immunization: Secondary | ICD-10-CM

## 2011-01-14 DIAGNOSIS — J449 Chronic obstructive pulmonary disease, unspecified: Secondary | ICD-10-CM

## 2011-01-14 DIAGNOSIS — G4730 Sleep apnea, unspecified: Secondary | ICD-10-CM

## 2011-01-14 NOTE — Assessment & Plan Note (Signed)
-   continue CPAP qhs

## 2011-01-14 NOTE — Assessment & Plan Note (Signed)
-   continue symbicort - SABA prn

## 2011-01-14 NOTE — Progress Notes (Signed)
Addended by: Michel Bickers A on: 01/14/2011 12:31 PM   Modules accepted: Orders
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
## 2011-01-14 NOTE — Patient Instructions (Signed)
Please continue your CPAP every night.  Use your Symbicort twice a day.  Use your albuterol inhaler as needed Follow up with Dr Lamonte Sakai in 6 months or sooner if needed Flu shot today

## 2011-01-21 LAB — BASIC METABOLIC PANEL
BUN: 16
Chloride: 99
GFR calc non Af Amer: >60
Glucose, Bld: 115 — ABNORMAL HIGH
Potassium: 4.6
Sodium: 137

## 2011-01-21 LAB — POCT HEMOGLOBIN-HEMACUE: Hemoglobin: 15.1

## 2011-02-09 ENCOUNTER — Encounter (HOSPITAL_COMMUNITY): Payer: 59 | Attending: Gastroenterology

## 2011-02-09 DIAGNOSIS — K5130 Ulcerative (chronic) rectosigmoiditis without complications: Secondary | ICD-10-CM | POA: Insufficient documentation

## 2011-03-31 ENCOUNTER — Other Ambulatory Visit: Payer: Self-pay | Admitting: Internal Medicine

## 2011-04-06 ENCOUNTER — Encounter (HOSPITAL_COMMUNITY): Payer: 59

## 2011-04-21 ENCOUNTER — Telehealth: Payer: Self-pay | Admitting: *Deleted

## 2011-04-21 NOTE — Telephone Encounter (Signed)
Spoke with pt who stated he saw Dr Britt Bolognese at [facility] and had a consult with a surgeon concerning his Crohn's. Called Medical Records at [phone], fax .[phone], who requested a Letterhead fax only to send records. Pt given an appt for 05/03/11 at 2:30pm. He also saw Dr Bosie Clos at [facility].

## 2011-04-21 NOTE — Telephone Encounter (Signed)
Message copied by Florene Glen on Wed Apr 21, 2011  2:27 PM ------      Message from: Beverley Fiedler      Created: Fri Apr 16, 2011  4:05 PM      Regarding: RE: Referral       Sure happy to.      Cornel Werber, pls schedule in new pt slot.      Also, we need his [facility] records prior to the visit.      Thanks      Vonna Kotyk       ----- Message -----         From: Dolores Patty, MD         Sent: 04/16/2011   2:36 PM           To: Erick Blinks, MD      Subject: Referral                                                 Ricardo Cooper is a patient of mine who has been struggling with Crohn's. He is currently being seen at [facility] but would like to be seen closer to home (and he doesn't like he previous [facility] doc). Any chance you can see him? 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 Thanks -dan

## 2011-04-22 NOTE — Telephone Encounter (Signed)
Faxed RELEASE OF MED. INFO to pt's daughter, Vanessa Barbara at the [employer]. She will have her dada sign the form and fax it to [place] at [phone].

## 2011-04-26 ENCOUNTER — Encounter: Payer: Self-pay | Admitting: Internal Medicine

## 2011-04-27 HISTORY — PX: COLONOSCOPY: SHX174

## 2011-04-27 HISTORY — PX: OTHER SURGICAL HISTORY: SHX169

## 2011-04-30 ENCOUNTER — Encounter: Payer: Self-pay | Admitting: Internal Medicine

## 2011-04-30 NOTE — Telephone Encounter (Signed)
Info received, pt has an appt for 05/03/11 at 2:30pm.

## 2011-05-03 ENCOUNTER — Other Ambulatory Visit: Payer: Self-pay | Admitting: Gastroenterology

## 2011-05-03 ENCOUNTER — Encounter: Payer: Self-pay | Admitting: Internal Medicine

## 2011-05-03 ENCOUNTER — Other Ambulatory Visit (INDEPENDENT_AMBULATORY_CARE_PROVIDER_SITE_OTHER): Payer: 59

## 2011-05-03 ENCOUNTER — Ambulatory Visit (INDEPENDENT_AMBULATORY_CARE_PROVIDER_SITE_OTHER): Payer: 59 | Admitting: Internal Medicine

## 2011-05-03 VITALS — BP 108/64 | HR 100 | Ht 71.5 in | Wt 237.0 lb

## 2011-05-03 DIAGNOSIS — K5090 Crohn's disease, unspecified, without complications: Secondary | ICD-10-CM

## 2011-05-03 DIAGNOSIS — K51919 Ulcerative colitis, unspecified with unspecified complications: Secondary | ICD-10-CM | POA: Insufficient documentation

## 2011-05-03 DIAGNOSIS — K5150 Left sided colitis without complications: Secondary | ICD-10-CM

## 2011-05-03 LAB — CBC WITH DIFFERENTIAL/PLATELET
Basophils Absolute: 0.1 10*3/uL (ref 0.0–0.1)
Eosinophils Absolute: 0.3 10*3/uL (ref 0.0–0.7)
HCT: 39.2 % (ref 39.0–52.0)
Hemoglobin: 13.3 g/dL (ref 13.0–17.0)
Lymphs Abs: 0.7 10*3/uL (ref 0.7–4.0)
MCHC: 33.9 g/dL (ref 30.0–36.0)
MCV: 97.7 fl (ref 78.0–100.0)
Monocytes Absolute: 0.7 10*3/uL (ref 0.1–1.0)
Neutro Abs: 3.3 10*3/uL (ref 1.4–7.7)
Platelets: 292 10*3/uL (ref 150.0–400.0)
RDW: 16.7 % — ABNORMAL HIGH (ref 11.5–14.6)

## 2011-05-03 NOTE — Patient Instructions (Signed)
Dr. Rhea Belton would like you to call us back with any questions or concerns regarding Humira.

## 2011-05-03 NOTE — Progress Notes (Addendum)
Subjective:    Patient ID: Ricardo Cooper, male    DOB: September 26, 1945, 66 y.o.   MRN: [id number]  HPI Ricardo Cooper is a 66 yo male with PMH of left-sided ulcerative colitis diagnosed in 2008, COPD, OSA, hypertension, GERD, history of basal cell carcinoma, and history of colon polyps who is seen in consultation at the request of Dr. Gala Romney for medically refractory UC.  The patient has had ulcerative colitis since 2008 and this has been deemed medically refractory. He has been seen by [facility] gastroenterology and colorectal surgery (Dr. Gwenith Spitz amd Dr Epifania Gore, respectively) and by [facility] (Dr. Nonah Mattes).  Today, he reports that he continues to have bloody diarrhea. He is having 4-5 bowel movements per day on good days as many as 6-10 on bad days. He is still having some nocturnal stooling and fecal seepage. He reports frequent lower GI gas. Pain has never been a significant problem for him. He reports no nausea or vomiting. No fevers or chills. His weight has been stable. He denies a history of trouble with anemia. He also denies extraintestinal manifestations including no rash, joint pains, eye symptoms.    He was last seen by Dr. Gwenith Spitz in November of 2012 and at that time his mesalamine preparation was changed to sulfasalazine. He remains on sulfasalazine 1000 mg 3 times a day, along with mercaptopurine 100 mg daily. He is also using hydrocortisone rectal foam. He reports at present, his colitis symptoms are "about as good as they have been for the past year".  He has been deemed medically refractory.  He has been treated with multiple different types of mesalamine preparations, last being Lialda.  He also was on steroids for the better part of 6 months without a great response. And then between August and November 2012 he was initiated on Remicade therapy and after 3 infusions was absolutely no better. Therefore this medication was discontinued.  Per documentation his last colonoscopy was in October  2011 I do not have the specific procedure report to review.  Review of Systems Constitutional: Negative for fever, chills, night sweats, activity change, appetite change and unexpected weight change HEENT: Negative for sore throat, mouth sores and trouble swallowing. Eyes: Negative for visual disturbance Respiratory: Negative for cough, chest tightness and shortness of breath Cardiovascular: Negative for chest pain, palpitations and lower extremity swelling Gastrointestinal: See history of present illness Genitourinary: Negative for dysuria and hematuria. Musculoskeletal: Negative for back pain, arthralgias and myalgias Skin: Negative for rash or color change Neurological: Negative for headaches, weakness, numbness Hematological: Negative for adenopathy, negative for easy bruising/bleeding Psychiatric/behavioral: Negative for depressed mood, negative for anxiety  Current Outpatient Prescriptions  Medication Sig Dispense Refill  . albuterol (PROVENTIL HFA) 108 (90 BASE) MCG/ACT inhaler Inhale 2 puffs into the lungs every 6 (six) hours as needed.        Marland Kitchen aspirin 81 MG tablet Take 81 mg by mouth daily.        . budesonide-formoterol (SYMBICORT) 160-4.5 MCG/ACT inhaler Inhale 2 puffs into the lungs 2 (two) times daily.        . Cholecalciferol (VITAMIN D3) 1000 UNITS CAPS Take 2 capsules by mouth daily.       . Coenzyme Q10 (CO Q-10) 100 MG CAPS Take by mouth daily.        Marland Kitchen diltiazem (DILACOR XR) 180 MG 24 hr capsule TAKE 1 CAPSULE BY MOUTH DAILY  30 capsule  7  . fish oil-omega-3 fatty acids 1000 MG capsule Take 2  g by mouth daily.        . hydrocortisone (PROCTOCORT) 90 MG/DOSE rectal foam Place 1 applicator rectally 1 day or 1 dose.        Marland Kitchen lisinopril-hydrochlorothiazide (PRINZIDE,ZESTORETIC) 20-12.5 MG per tablet TAKE 1 TABLET BY MOUTH ONCE A DAY  90 tablet  3  . mercaptopurine (PURINETHOL) 50 MG tablet 2 by mouth daily      . Multiple Vitamin (MULTIVITAMIN) capsule Take 1 capsule by  mouth daily as needed.       . vitamin B-12 (CYANOCOBALAMIN) 250 MCG tablet Take 250 mcg by mouth daily.         Allergies  Allergen Reactions  . Ibuprofen     REACTION: causes colitis flare  . Sulfonamide Derivatives     REACTION: headaches   Past Medical History  Diagnosis Date  . Allergic rhinitis   . Paroxysmal atrial fibrillation   . Hyperlipidemia   . HTN (hypertension)   . Obstructive sleep apnea on CPAP   . Ulcerative colitis   . COPD (chronic obstructive pulmonary disease)   . UC   . Diverticulosis   . Skin cancer     basel cell  . GERD (gastroesophageal reflux disease)   . Obesity   . Sleep apnea    Past Surgical History  Procedure Date  . Back surgery 2005  . Colon polyps   . Fetal surgery for congenital hernia   . Tonsillectomy   . Infection in chest that had to be cleaned out     Staff Infection   Family History  Problem Relation Age of Onset  . Emphysema Father   . Heart disease Father   . Emphysema Maternal Grandfather   . Heart disease Maternal Grandfather   . Stroke Maternal Grandfather   . Allergies Mother   . Asthma Sister   .       Social History  . Marital Status: Married   Occupational History  . Maintenance Manager     [employer]   Social History Main Topics  . Smoking status: Former Smoker -- 1.0 packs/day for 35 years  . Smokeless tobacco: Never Used   Comment: quit in 2005  . Alcohol Use: Yes, 3-4 drinks/week  . Drug Use: No      Objective:   Physical Exam BP 108/64  Pulse 100  Ht 5' 11.5" (1.816 m)  Wt 237 lb (107.502 kg)  BMI 32.59 kg/m2 Constitutional: Well-developed and well-nourished. No distress. HEENT: Normocephalic and atraumatic. Oropharynx is clear and moist. No oropharyngeal exudate. Conjunctivae are normal. Pupils are equal round and reactive to light. No scleral icterus. Neck: Neck supple. Trachea midline. Cardiovascular: Normal rate, regular rhythm and intact distal pulses. No M/R/G Pulmonary/chest:  Effort normal and breath sounds normal. No wheezing, rales or rhonchi. Abdominal: Soft, nontender, nondistended. Bowel sounds active throughout. There are no masses palpable. No hepatosplenomegaly. Extremities: no clubbing, cyanosis, or edema Lymphadenopathy: No cervical adenopathy noted. Neurological: Alert and oriented to person place and time. Skin: Skin is warm and dry. No rashes noted. Psychiatric: Normal mood and affect. Behavior is normal.  Labs: Prior celiac panel neg.  Thiopurine metabolite panel reportedly checked by [facility], I do not have this result today.    Assessment & Plan:  Ricardo Cooper is a 66 yo male with PMH of left-sided ulcerative colitis diagnosed in 2008, COPD, OSA, hypertension, GERD, history of basal cell carcinoma, and history of colon polyps who is seen in consultation at the request of Dr.  Bensimhon for medically refractory UC.  1. Left-sided UC, medically refractory -- at this point my opinion regarding his ulcerative colitis is in line with Dr. Gwenith Spitz.  The patient has been on 5-ASA therapy, immunomodulators therapy, and biologic therapy without complete or even modest response.  He is still having multiple bloody bowel movements a day indicating active colitis. He still feels that surgery is his absolute last option at this point. We have discussed Humira, and its new indication in ulcerative colitis. This drug is an option for him, but I have told him frankly that I think the chance of him responding a small. That being said, I see little downside into trying this therapy. He already has had a negative PPD and negative viral hepatitis serologies. We did discuss the risks of biologic therapy including infection, malignancy, heart failure, but rare instances of demyelinating disease, and he understands and is willing to proceed. He would like to discuss this year assistance program with his insurance company and ensure that he would qualify. We also discussed referral  for a second colorectal surgeon opinion. He asked my opinion, and my strong recommendation would be Dr. Donnetta Hutching with [facility].  The plan for now will be likely to try Humira therapy and if no response then referral to [facility] to see Dr. Abigail Miyamoto. I will repeat labs today to include CBC, CMP, ESR, and CRP. He will continue sulfasalazine 1 g 3 times a day, mercaptopurine 100 mg daily and hydrocortisone rectal foam. I will question her records from [facility] to ensure his 6-MP is dosed appropriately (thiopurine Metabolite panel).  I'll wait to hear from him once he determines if he will qualify for Humira related pharmaceutical assistance.  Addendum: I received records today by fax. There is documentation of colonoscopy from 07/21/2009 --There was diffuse colitis extending to approximately 25 cm consistent with ulcerative colitis. There was a pedunculated and sessile polyp in the rectum. This polyp was 8 mm and removed with hot snare. There were is also 2 polyps removed from the descending colon and cecum. --Pathology results: Cecum and descending colon polyps = adenomatous polyps, no high-grade dysplasia or malignancy. Rectal polyp = adenomatous polyps, no high-grade dysplasia. Rectal polyp = polypoid inflamed colorectal mucosa with hyperplastic changes consistent with pseudopolyps. Sigmoid biopsy chronic active colitis consistent with IBD  At this time Dr. Bosie Clos recommended recall colonoscopy in 2 years, which would be March 2013 I am also concerned that adenomatous polyp arose in the setting of inflammation/inflammatory bowel disease. This is another reason for repeat colon in March

## 2011-05-04 ENCOUNTER — Telehealth: Payer: Self-pay | Admitting: *Deleted

## 2011-05-04 LAB — COMPREHENSIVE METABOLIC PANEL
ALT: 28 U/L (ref 0–53)
AST: 18 U/L (ref 0–37)
Alkaline Phosphatase: 43 U/L (ref 39–117)
CO2: 29 mEq/L (ref 19–32)
Creatinine, Ser: 1 mg/dL (ref 0.4–1.5)
GFR: 78.12 mL/min (ref >60.00)
Total Bilirubin: 0.5 mg/dL (ref 0.3–1.2)

## 2011-05-04 NOTE — Telephone Encounter (Signed)
Message copied by Florene Glen on Tue May 04, 2011  2:30 PM ------      Message from: Beverley Fiedler      Created: Tue May 04, 2011 10:58 AM       Labs look okay, but reveal inflammation consistent with active UC      We are waiting to hear from him regarding if he will qualify for the Humira Rx benefit prgoram to lessen his copays.

## 2011-05-04 NOTE — Telephone Encounter (Signed)
Informed pt per Dr Rhea Belton, his labs look okay, but reveal inflammation consistent with active UC. Pt stated understanding and hasn't checked on the Humira yet; he will call when he has results.

## 2011-05-05 ENCOUNTER — Telehealth: Payer: Self-pay | Admitting: Internal Medicine

## 2011-05-05 ENCOUNTER — Encounter: Payer: Self-pay | Admitting: Internal Medicine

## 2011-05-05 NOTE — Telephone Encounter (Signed)
Spoke with pt to inform him I called [employer] and Humira is covered/dispensed by [employer], [phone]. He was registered today his co pay for the starter will be $40 as well as as the 2 pen monthly mtce kit. Pt stated understanding and [facility] will contact him.  ( I gave the correct DX Code to [facility] 556.5, but stated Crohn's instead of UC. Called back to correct it ).

## 2011-05-10 ENCOUNTER — Telehealth: Payer: Self-pay | Admitting: Internal Medicine

## 2011-05-10 NOTE — Telephone Encounter (Signed)
Pt reports he has been contacted and the Humira should be shipped tomorrow. We will instruct him on the administration on the 1st time and more often if he needs it; he will call when the drug is received.

## 2011-05-11 ENCOUNTER — Ambulatory Visit: Payer: 59 | Admitting: Internal Medicine

## 2011-05-11 ENCOUNTER — Telehealth: Payer: Self-pay | Admitting: Internal Medicine

## 2011-05-11 NOTE — Telephone Encounter (Signed)
Spoke with pt and he will come in at 1:30pm today for Pam to teach him.

## 2011-05-18 ENCOUNTER — Telehealth: Payer: Self-pay | Admitting: Internal Medicine

## 2011-05-18 NOTE — Telephone Encounter (Signed)
Received copies from [facility],on 05/18/11. Forwarded 64pages to Dr. Donnita Falls review.

## 2011-05-19 ENCOUNTER — Telehealth: Payer: Self-pay | Admitting: *Deleted

## 2011-05-19 NOTE — Telephone Encounter (Signed)
Message copied by Florene Glen on Wed May 19, 2011  1:26 PM ------      Message from: Beverley Fiedler      Created: Wed May 19, 2011 11:47 AM       Aram Beecham,      I know we just started Mr. Mkrtchyan on adalimumab, but after reviewing his records he is due for repeat colonoscopy in March 2013.      Posterior by this point he will have had a positive response to the adalimumab. Please place him on the schedule for colonoscopy in March 2013.

## 2011-05-20 NOTE — Telephone Encounter (Signed)
Pt will call me back when he can see his calendar to schedule a COLON.

## 2011-05-21 NOTE — Telephone Encounter (Signed)
Scheduled pt for PV on 06/17/11 and Direct COLON for 06/28/11 at 11am. Pt states he's doing much better.

## 2011-06-04 ENCOUNTER — Telehealth: Payer: Self-pay | Admitting: Internal Medicine

## 2011-06-07 NOTE — Telephone Encounter (Signed)
Pt called in to expedite his Prior Auth for Humira; states he need the injection by Wednesday, 06/09/11. Found the Prior Auth in Dr [employer]; it was completed and faxed back to [facility] script. Called [phone]  and the rep stated it will take 24 hrs for the approval. I can call them back tomorrow or [facility] at [phone] to see if they can ship it overnight.

## 2011-06-08 NOTE — Telephone Encounter (Signed)
Ricardo Cooper rep, [phone], called from [facility] to report he will not be back until Monday, 06/14/11 and can get [place] some samples then. Left word on his cell that if he knows how to get a sample tomorrow to call back.

## 2011-06-08 NOTE — Telephone Encounter (Signed)
Notified pt I just spent 1 hr trying to get help on his Prior Auth for Humira. I called [employer] at [phone] and the PA was denied stating it was only approved for Crohn's, RA and Psoriasis, not UC.  Called [employer] who stated I would have to write an appeal which could take days or weeks. I called [facility] who suggested I fax an appeal, mark it URGENT and fax it to [phone]. I then called [employer] assistance and got [facility] [employer], Kirsten who will contact [place]  And see why the Starter Kit was approved, but the mtce drug is being denied. I then spoke with pt assistance at [employer] and pt would hve to start a process for assistance which could take a while. I now have a number for the Drug Rep, Lindie Spruce [phone] whom I will try to contact.

## 2011-06-09 ENCOUNTER — Telehealth: Payer: Self-pay | Admitting: Internal Medicine

## 2011-06-09 MED ORDER — AMBULATORY NON FORMULARY MEDICATION: Status: DC

## 2011-06-09 NOTE — Telephone Encounter (Signed)
Pt reports he can't reach his doc in [place] and he needs the Cortifoam refilled. Spoke with  Dr Hilarie Fredrickson and ok to order. Informed pt I sent an appeals letter in for the [facility], but it may take 3 days to hear anything; pt stated understanding.

## 2011-06-09 NOTE — Telephone Encounter (Signed)
Called back to [employer] about prior auth and to see if fax was received. Apparently, I still need to mail in the appeal. Sent to: [facility]   [place], [place] [address]  ATTN: [facility] APPEALS.

## 2011-06-09 NOTE — Telephone Encounter (Signed)
Spoke with [place] at [employer] for Humira. He read an update from rep's note stating the only way to resolve the problem with [facility] is a written appeal to be faxed to [phone] marked URGENT. Will try that. They know of no other way to get a syringe for dose due today since our drug rep is in [place].

## 2011-06-09 NOTE — Telephone Encounter (Signed)
Ordered pt's cortifoam; pt informed

## 2011-06-11 ENCOUNTER — Telehealth: Payer: Self-pay | Admitting: Internal Medicine

## 2011-06-11 NOTE — Telephone Encounter (Signed)
Error

## 2011-06-14 NOTE — Telephone Encounter (Signed)
Notified pt that I spoke with Ricardo Cooper the Humira rep this am and he will try to get [place] samples until we can straighten this mess out; pt stated understanding.

## 2011-06-17 ENCOUNTER — Encounter: Payer: Self-pay | Admitting: Internal Medicine

## 2011-06-17 ENCOUNTER — Ambulatory Visit (AMBULATORY_SURGERY_CENTER): Payer: 59 | Admitting: *Deleted

## 2011-06-17 ENCOUNTER — Telehealth: Payer: Self-pay | Admitting: Internal Medicine

## 2011-06-17 VITALS — Ht 72.0 in | Wt 236.3 lb

## 2011-06-17 DIAGNOSIS — K5150 Left sided colitis without complications: Secondary | ICD-10-CM

## 2011-06-17 MED ORDER — PEG-KCL-NACL-NASULF-NA ASC-C 100 G PO SOLR: ORAL | Status: DC

## 2011-06-17 NOTE — Telephone Encounter (Signed)
lmom for pt to call back

## 2011-06-18 ENCOUNTER — Telehealth: Payer: Self-pay | Admitting: Internal Medicine

## 2011-06-18 NOTE — Telephone Encounter (Signed)
Pt reports he received a letter from [facility] stating his appeal was approved and he was approved for "Back On Track" until 06/24/11 (?). Called [facility] and reordered the mtce dose of the drug for every other week injections. Pt should receive the injections on Tuesday. Lindie Spruce, [employer] rep lmom stating there had been a delay for approvals of samples and they should be shipped out Monday for receipt on Tuesday or Wednesday. Pt reports he doesn't qualify for the $5 copay from [employer], but they did give him a way to retrieve some of his co pay back; pt stated understanding with the above conversation.

## 2011-06-18 NOTE — Telephone Encounter (Signed)
See note from 06-17-11

## 2011-06-24 ENCOUNTER — Telehealth: Payer: Self-pay | Admitting: Internal Medicine

## 2011-06-24 NOTE — Telephone Encounter (Signed)
Maybe 1 reason he is better is related to the medication. I do think he needs it given his complicated hx with UC in the last several years. He should resume every other week as soon as drug is available. Thanks

## 2011-06-24 NOTE — Telephone Encounter (Signed)
Notified pt of Dr Vena Rua advice and he stated understanding. We did get a Humira sample in and I offered him one so he could start today. Pt stated he'll wait one more day until his get here; he will call if not received by tomorrow afternoon.

## 2011-06-24 NOTE — Telephone Encounter (Signed)
Pt reports he never received his Humira; states he's doing well and does he reall need it? Called [facility] [phone] and the order was placed, but no appt was made for drug to be shipped. They contacted the pt and they will ship the drug tomorrow. Dr Hilarie Fredrickson, pt wants to know since he is doing well off the Humira, maybe he doesn't need to start back. I informed him since he got a good response, if he stops and ever starts back again he may make antibodies to the drug. Please advise. Thanks.

## 2011-06-28 ENCOUNTER — Ambulatory Visit (AMBULATORY_SURGERY_CENTER): Payer: 59 | Admitting: Internal Medicine

## 2011-06-28 ENCOUNTER — Encounter: Payer: Self-pay | Admitting: Internal Medicine

## 2011-06-28 VITALS — BP 126/71 | HR 89 | Temp 96.3°F | Resp 18 | Ht 72.0 in | Wt 236.0 lb

## 2011-06-28 DIAGNOSIS — D126 Benign neoplasm of colon, unspecified: Secondary | ICD-10-CM

## 2011-06-28 DIAGNOSIS — K5190 Ulcerative colitis, unspecified, without complications: Secondary | ICD-10-CM

## 2011-06-28 DIAGNOSIS — K635 Polyp of colon: Secondary | ICD-10-CM

## 2011-06-28 DIAGNOSIS — K5150 Left sided colitis without complications: Secondary | ICD-10-CM

## 2011-06-28 MED ORDER — SODIUM CHLORIDE 0.9 % IV SOLN: 500.0000 mL | INTRAVENOUS | Status: DC

## 2011-06-28 NOTE — Progress Notes (Signed)
Patient did not have preoperative order for IV antibiotic SSI prophylaxis. (G8918)  Patient did not experience any of the following events: a burn prior to discharge; a fall within the facility; wrong site/side/patient/procedure/implant event; or a hospital transfer or hospital admission upon discharge from the facility. (G8907)  

## 2011-06-28 NOTE — Patient Instructions (Addendum)
AVOID ALL NSAIDS, (MOTRIN, ALEVE, IBUPROFEN)  CONTINUE YOUR CURRENT MEDICATIONS.  AWAIT PATHOLOGY RESULTS.YOU HAD AN ENDOSCOPIC PROCEDURE TODAY AT [facility]: Refer to the procedure report that was given to you for any specific questions about what was found during the examination.  If the procedure report does not answer your questions, please call your gastroenterologist to clarify.  If you requested that your care partner not be given the details of your procedure findings, then the procedure report has been included in a sealed envelope for you to review at your convenience later.  YOU SHOULD EXPECT: Some feelings of bloating in the abdomen. Passage of more gas than usual.  Walking can help get rid of the air that was put into your GI tract during the procedure and reduce the bloating. If you had a lower endoscopy (such as a colonoscopy or flexible sigmoidoscopy) you may notice spotting of blood in your stool or on the toilet paper. If you underwent a bowel prep for your procedure, then you may not have a normal bowel movement for a few days.  DIET: Your first meal following the procedure should be a light meal and then it is ok to progress to your normal diet.  A half-sandwich or bowl of soup is an example of a good first meal.  Heavy or fried foods are harder to digest and may make you feel nauseous or bloated.  Likewise meals heavy in dairy and vegetables can cause extra gas to form and this can also increase the bloating.  Drink plenty of fluids but you should avoid alcoholic beverages for 24 hours.  ACTIVITY: Your care partner should take you home directly after the procedure.  You should plan to take it easy, moving slowly for the rest of the day.  You can resume normal activity the day after the procedure however you should NOT DRIVE or use heavy machinery for 24 hours (because of the sedation medicines used during the test).    SYMPTOMS TO REPORT IMMEDIATELY: A  gastroenterologist can be reached at any hour.  During normal business hours, 8:30 AM to 5:00 PM Monday through Friday, call [phone].  After hours and on weekends, please call the GI answering service at [phone] who will take a message and have the physician on call contact you.   Following lower endoscopy (colonoscopy or flexible sigmoidoscopy):  Excessive amounts of blood in the stool  Significant tenderness or worsening of abdominal pains  Swelling of the abdomen that is new, acute  Fever of 100F or higher  Following upper endoscopy (EGD)  Vomiting of blood or coffee ground material  New chest pain or pain under the shoulder blades  Painful or persistently difficult swallowing  New shortness of breath  Fever of 100F or higher  Black, tarry-looking stools  FOLLOW UP: If any biopsies were taken you will be contacted by phone or by letter within the next 1-3 weeks.  Call your gastroenterologist if you have not heard about the biopsies in 3 weeks.  Our staff will call the home number listed on your records the next business day following your procedure to check on you and address any questions or concerns that you may have at that time regarding the information given to you following your procedure. This is a courtesy call and so if there is no answer at the home number and we have not heard from you through the emergency physician on call, we will assume that you have  returned to your regular daily activities without incident.  SIGNATURES/CONFIDENTIALITY: You and/or your care partner have signed paperwork which will be entered into your electronic medical record.  These signatures attest to the fact that that the information above on your After Visit Summary has been reviewed and is understood.  Full responsibility of the confidentiality of this discharge information lies with you and/or your care-partner.

## 2011-06-28 NOTE — Op Note (Signed)
[facility] [employer] [place], [place]  [id number]  COLONOSCOPY PROCEDURE REPORT  PATIENT:  Ricardo Cooper, Ricardo Cooper  MR#:  [id number] BIRTHDATE:  1945-07-29, 65 yrs. old  GENDER:  male ENDOSCOPIST:  Lajuan Lines. Pyrtle, MD REF. BY:  Shaune Pascal. Bensimhon, M.D. PROCEDURE DATE:  06/28/2011 PROCEDURE:  Colonoscopy with snare polypectomy, Colonoscopy with multiple cold biopsies ASA CLASS:  Class III INDICATIONS:  evaluation of ulcerative colitis, history of pre-cancerous (adenomatous) colon polyps MEDICATIONS:   These medications were titrated to patient response per physician's verbal order, Versed 10 mg IV, Fentanyl 75 mcg IV  DESCRIPTION OF PROCEDURE:   After the risks benefits and alternatives of the procedure were thoroughly explained, informed consent was obtained.  Digital rectal exam was performed and revealed no rectal masses and perianal dermatitis.   The LB 180AL [id number] endoscope was introduced through the anus and advanced to the cecum, which was identified by both the appendix and ileocecal valve, without limitations.  The quality of the prep was good, using MoviPrep.  The instrument was then slowly withdrawn as the colon was fully examined. <<PROCEDUREIMAGES>>  FINDINGS:  A 10 mm sessile polyp was found in the ascending colon. Polyp was snared, then cauterized with monopolar cautery. Retrieval was successful.  A 5 mm sessile polyp was found in the ascending colon. Polyp was snared without cautery. Retrieval was successful. Two 5 mm sessile polyps were found in the descending colon. Polyps were snared without cautery. Retrieval was successful.  Colitis was found in the sigmoid colon beginning at around 30 cm from the dentate line and extended to the rectum. This was characterized by erythema, loss of normal vascular pattern, ulceration, friability, narrowing, and what is felt to be pseudopolyp formation. Random biopsies were obtained and sent to pathology.  Two sessile  polyps were found in the sigmoid colon arising in an area of colitis. Polyps were 4 - 5 mm and snared, then cauterized with monopolar cautery. Retrieval was successful. There were multiple polyps identified in the recto-sigmoid colon which may represent pseudopolyps. Multiple biopsies were obtained and sent to pathology to rule out adenomatous change.  Moderate diverticulosis was found scattered throughout the colon. Retroflexion in the rectum was not done.   The scope was then withdrawn from the cecum and the procedure completed.  COMPLICATIONS:  None  ENDOSCOPIC IMPRESSION: 1) Sessile polyp in the ascending colon. Removed and sent to pathology. 2) Sessile polyp in the ascending colon. Removed and sent to pathology. 3) 2 Sessile polyps in the descending colon. Removed and sent to pathology. 4) Colitis in the sigmoid colon to rectum 5) Two polyps in the sigmoid colon. Removed and sent to pathology. 6) Polyps, multiple in the recto-sigmoid colon.  Multiple biopsies performed. 7) Moderate diverticulosis throughout the colon  RECOMMENDATIONS: 1) Avoid all NSAIDs 2) Await pathology results. 3) You will receive a letter within 1-2 weeks with the results of your biopsy as well as final recommendations. Please call my office if you have not received a letter after 3 weeks. 4) Continue current medications 5) We will discuss recommendation once pathology results are available.  Lajuan Lines. Hilarie Fredrickson, MD  CC:  The Patient W. Lutricia Feil, MD Jolaine Artist, MD  n. Lorrin MaisLajuan Lines. Pyrtle at 06/28/2011 12:14 PM  Carola Rhine, [phone]

## 2011-06-29 ENCOUNTER — Telehealth: Payer: Self-pay | Admitting: *Deleted

## 2011-06-29 NOTE — Telephone Encounter (Signed)
  Follow up Call-  Call back number 06/28/2011  Post procedure Call Back phone  # [phone] hm  Permission to leave phone message Yes     Patient questions:      Left message on f/u call . Pt had colonoscopy yesterday.

## 2011-07-05 ENCOUNTER — Encounter: Payer: Self-pay | Admitting: Internal Medicine

## 2011-07-07 ENCOUNTER — Other Ambulatory Visit: Payer: Self-pay | Admitting: Internal Medicine

## 2011-07-09 ENCOUNTER — Other Ambulatory Visit: Payer: Self-pay | Admitting: Gastroenterology

## 2011-07-09 MED ORDER — SULFASALAZINE 500 MG PO TBEC: 500.0000 mg | DELAYED_RELEASE_TABLET | Freq: Two times a day (BID) | ORAL | Status: DC

## 2011-07-09 NOTE — Telephone Encounter (Signed)
Refilled pt.'s Sulfazine. 0 refills pharmacy to call pt.

## 2011-07-13 ENCOUNTER — Ambulatory Visit (INDEPENDENT_AMBULATORY_CARE_PROVIDER_SITE_OTHER): Payer: 59 | Admitting: Emergency Medicine

## 2011-07-13 ENCOUNTER — Encounter: Payer: Self-pay | Admitting: Emergency Medicine

## 2011-07-13 VITALS — BP 118/72 | HR 81 | Temp 98.0°F | Ht 72.0 in | Wt 242.2 lb

## 2011-07-13 DIAGNOSIS — J449 Chronic obstructive pulmonary disease, unspecified: Secondary | ICD-10-CM

## 2011-07-13 DIAGNOSIS — K5150 Left sided colitis without complications: Secondary | ICD-10-CM
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
## 2011-07-13 NOTE — Patient Instructions (Signed)
Please continue your medications as you are taking them  We will perform a CXR next visit Follow with Dr Lamonte Sakai in 6 months or sooner if you have any problems Call our office if you have ant problems this Spring with allergies.

## 2011-07-13 NOTE — Assessment & Plan Note (Signed)
On [place] - check CXR next time as screening on immunosuppresion.

## 2011-07-13 NOTE — Assessment & Plan Note (Signed)
Stable -  - continue same regimen - rov in 6 mon

## 2011-07-15 ENCOUNTER — Telehealth: Payer: Self-pay | Admitting: Internal Medicine

## 2011-07-19 ENCOUNTER — Other Ambulatory Visit: Payer: Self-pay | Admitting: Gastroenterology

## 2011-07-19 MED ORDER — SULFASALAZINE 500 MG PO TBEC: 500.0000 mg | DELAYED_RELEASE_TABLET | Freq: Three times a day (TID) | ORAL | Status: DC

## 2011-07-19 NOTE — Telephone Encounter (Signed)
Spoke to pharmacist about medications.

## 2011-07-27 ENCOUNTER — Other Ambulatory Visit: Payer: Self-pay

## 2011-07-27 MED ORDER — DILTIAZEM HCL ER 180 MG PO CP24: 180.0000 mg | ORAL_CAPSULE | Freq: Every day | ORAL | Status: DC

## 2011-07-29 ENCOUNTER — Telehealth: Payer: Self-pay | Admitting: Internal Medicine

## 2011-07-29 NOTE — Telephone Encounter (Signed)
All C[place] faxed to [facility] @  [phone] 07/29/11/KM

## 2011-08-11 ENCOUNTER — Telehealth: Payer: Self-pay | Admitting: *Deleted

## 2011-08-11 NOTE — Telephone Encounter (Signed)
lmom fpr pt to call back.

## 2011-08-11 NOTE — Telephone Encounter (Signed)
Message copied by Florene Glen on Wed Aug 11, 2011 10:28 AM ------      Message from: Florene Glen      Created: Tue Jul 06, 2011  9:30 AM       Needs 8wk f/u after colon 06/30/11

## 2011-08-25 ENCOUNTER — Other Ambulatory Visit: Payer: Self-pay | Admitting: Internal Medicine

## 2011-08-25 NOTE — Telephone Encounter (Signed)
Scheduled pt for his f/u in May.

## 2011-08-26 ENCOUNTER — Other Ambulatory Visit: Payer: Self-pay | Admitting: Internal Medicine

## 2011-08-26 MED ORDER — DILTIAZEM HCL ER 180 MG PO CP24: 180.0000 mg | ORAL_CAPSULE | Freq: Every day | ORAL | Status: DC

## 2011-08-27 ENCOUNTER — Other Ambulatory Visit: Payer: Self-pay | Admitting: Gastroenterology

## 2011-08-31 ENCOUNTER — Other Ambulatory Visit: Payer: Self-pay

## 2011-09-02 ENCOUNTER — Other Ambulatory Visit: Payer: Self-pay | Admitting: Emergency Medicine

## 2011-09-03 ENCOUNTER — Encounter: Payer: Self-pay | Admitting: Internal Medicine

## 2011-09-06 ENCOUNTER — Encounter: Payer: Self-pay | Admitting: Internal Medicine

## 2011-09-06 ENCOUNTER — Ambulatory Visit (INDEPENDENT_AMBULATORY_CARE_PROVIDER_SITE_OTHER): Payer: 59 | Admitting: Internal Medicine

## 2011-09-06 VITALS — BP 96/52 | HR 84 | Ht 72.0 in | Wt 240.2 lb

## 2011-09-06 DIAGNOSIS — K5190 Ulcerative colitis, unspecified, without complications: Secondary | ICD-10-CM

## 2011-09-06 NOTE — Patient Instructions (Signed)
Your physician has requested that you get lab work done and fax the results back to our office.

## 2011-09-06 NOTE — Progress Notes (Addendum)
Subjective:    Patient ID: Ricardo Cooper, male    DOB: December 19, 1945, 66 y.o.   MRN: [id number]  HPI Mr. Oaxaca is a 66 yo male with PMH of left-sided ulcerative colitis diagnosed in 2008, COPD, OSA, hypertension, GERD, history of basal cell carcinoma, and history of colon polyps who is seen in follow-up.  Since last being seen in clinic, he has been started on Humira, which she continues on every other week. He also remains on her mercaptopurine 100 mg daily and sulfasalazine 1 g 3 times a day.  He underwent colonoscopy in March which revealed tubular adenomas without high-grade dysplasia or malignancy in the ascending colon and descending colon, and these areas are uninvolved with chronic active colitis. He did have pseudopolyps in the sigmoid and rectosigmoid which were inflammatory or hyperplastic in nature. He had no adenomatous change in the segments of his colon affected by colitis. He did have ongoing moderate changes of colitis from the rectum to sigmoid colon at 30 cm.  Today, he returns for followup stating that his disease is "livable". He is having about 2 formed stools a day. Occasionally the stools are hard. He is not having diarrhea. He occasionally sees red blood and occasional "rust colored mucus" but this is significantly less rectal bleeding than before. On most days he sees no blood at all. He denies abdominal pain, of no abdominal pain has never been a big issue for him. He's not had fevers or chills. His appetite has remained good. With the Humira, on the last 2 injections he has noted a little irritation and local inflammation at his injection site but this is been self-limited and minor. He is without new complaint.  Review of Systems As per history of present illness, otherwise negative  Current Medications, Allergies, Past Medical History, Past Surgical History, Family History and Social History were reviewed in [employer] record.     Objective:     Physical Exam BP 96/52  Pulse 84  Ht 6' (1.829 m)  Wt 240 lb 4 oz (108.977 kg)  BMI 32.58 kg/m2 Constitutional: Well-developed and well-nourished. No distress. HEENT: Normocephalic and atraumatic. Oropharynx is clear and moist. No oropharyngeal exudate. Conjunctivae are normal. Pupils are equal round and reactive to light. No scleral icterus. Neck: Neck supple. Trachea midline. Cardiovascular: Normal rate, regular rhythm and intact distal pulses. No M/R/G Pulmonary/chest: Effort normal and breath sounds normal. No wheezing, rales or rhonchi. Abdominal: Soft, nontender, nondistended. Bowel sounds active throughout. There are no masses palpable. No hepatosplenomegaly. Extremities: no clubbing, cyanosis, or edema, scattered ecchymosis bilateral upper extremities Lymphadenopathy: No cervical adenopathy noted. Neurological: Alert and oriented to person place and time. Skin: Skin is warm and dry. No rashes noted. Psychiatric: Normal mood and affect. Behavior is normal.     Assessment & Plan:  66 yo male with PMH of left-sided ulcerative colitis diagnosed in 2008, COPD, OSA, hypertension, GERD, history of basal cell carcinoma, and history of colon polyps who is seen in follow-up.  1. UC -- the patient unfortunately, has never been able to achieve endoscopic remission. This is on 5-ASA, immunomodulators therapy, and biologic therapy. Clinically, he is "okay" and has been warning to avoid colectomy if at all possible. He does realize that he may eventually need colectomy to control his disease, but not based on how he feels now. We have discussed how ideally, I would like to strive for endoscopic remission. However, he is already on maximal medical therapy. I think checking  a thiopurine metabolite panel makes sense to ensure that he is on the maximum safe dose of mercaptopurine.  He reports having this done previously at [facility] on his current dose, and I will request this lab result from Dr. Oleta Mouse.  If  we cannot track this down, I think it is worth repeating. We have also discussed his history of adenomatous colon polyps, and the fact that if any adenomatous change arises in his segment of chronic active colitis, then the best therapy at that point we'll likely be colectomy. He understands this recommendation. The adenomas removed on his recent exam were all in segments not affected by his colitis. I. Like to repeat his colonoscopy in one year, which will be March 2014.  He is due for labs, specifically CBC, CMP, ESR and CRP. He has labs coming up with Dr. Brigitte Pulse and we will ask Dr. Brigitte Pulse to include these labs when he draws his panel at his routine annual followup. The results will be faxed back to me. He will call and let me know if symptoms worsen prior to followup, otherwise followup in 3-4 months  Addendum: Received lab results from [facility], dated 12/25/10 [facility] thiopurine metabolite panel 6-TGN 205 (lower likelihood of response).  6-MMPN 8734 (higher risk of hepatotoxicity). TPMT level -- normal activity.  --Based on these findings, we cannot safely raise dose of 6-mp given the 6-MMPN metabolite and the risk of liver toxicity.  However, unfortunately, at this dose his active metabolite (what makes the drug work in [place]), 6-TGN is a bit low. For now, continue current doses of 5-ASA, 6-mp and humira.

## 2011-09-09 ENCOUNTER — Telehealth: Payer: Self-pay | Admitting: Internal Medicine

## 2011-09-09 ENCOUNTER — Telehealth: Payer: Self-pay | Admitting: *Deleted

## 2011-09-09 NOTE — Telephone Encounter (Signed)
Received copies from [place] ,on 09/09/11 . Forwarded 3 pages to Dr.Pyrtle,for review.

## 2011-09-09 NOTE — Telephone Encounter (Signed)
Informed pt of Dr Vena Rua findings and recommendations about his meds; pt stated understanding.

## 2011-09-09 NOTE — Telephone Encounter (Signed)
Message copied by Lance Morin on Thu Sep 09, 2011  4:24 PM ------      Message from: Jerene Bears      Created: Thu Sep 09, 2011  2:59 PM       Let him know, I got the blood work from [employer].      We cannot safely increase his mercaptopurine.  Therefore, he should continue at his current dose of all 3 of this UC meds.      Thanks

## 2011-09-29 ENCOUNTER — Other Ambulatory Visit: Payer: Self-pay | Admitting: Emergency Medicine

## 2011-11-08 ENCOUNTER — Other Ambulatory Visit: Payer: Self-pay | Admitting: Emergency Medicine

## 2011-11-12 ENCOUNTER — Encounter: Payer: Self-pay | Admitting: Internal Medicine

## 2011-11-13 ENCOUNTER — Other Ambulatory Visit: Payer: Self-pay | Admitting: Internal Medicine

## 2011-12-10 ENCOUNTER — Other Ambulatory Visit: Payer: Self-pay | Admitting: Emergency Medicine

## 2012-01-24 ENCOUNTER — Encounter: Payer: Self-pay | Admitting: Emergency Medicine

## 2012-01-24 ENCOUNTER — Ambulatory Visit (INDEPENDENT_AMBULATORY_CARE_PROVIDER_SITE_OTHER): Payer: 59 | Admitting: Emergency Medicine

## 2012-01-24 VITALS — BP 128/70 | HR 86 | Temp 98.4°F | Ht 72.0 in | Wt 240.0 lb

## 2012-01-24 DIAGNOSIS — J449 Chronic obstructive pulmonary disease, unspecified: Secondary | ICD-10-CM

## 2012-01-24 DIAGNOSIS — G4730 Sleep apnea, unspecified: Secondary | ICD-10-CM

## 2012-01-24 NOTE — Patient Instructions (Addendum)
Please continue your Symbicort as you are taking it Keep albuterol available to use if needed Follow with Dr Lamonte Sakai in 1 year or sooner if needed
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
## 2012-01-24 NOTE — Assessment & Plan Note (Signed)
Please continue your Symbicort as you are taking it Keep albuterol available to use if needed Follow with Dr Lamonte Sakai in 1 year or sooner if neede

## 2012-01-24 NOTE — Assessment & Plan Note (Signed)
CPAP.  

## 2012-02-03 ENCOUNTER — Other Ambulatory Visit: Payer: Self-pay | Admitting: Internal Medicine

## 2012-02-03 NOTE — Telephone Encounter (Signed)
Will forward to Lauren as this is a RB patient.

## 2012-02-04 ENCOUNTER — Other Ambulatory Visit: Payer: Self-pay | Admitting: Internal Medicine

## 2012-02-16 ENCOUNTER — Telehealth: Payer: Self-pay | Admitting: Internal Medicine

## 2012-02-16 NOTE — Telephone Encounter (Signed)
Pt reports he will go on Medicare next month and the Humira will cost him >$4K yearly with his co pay and donut hole. He states he thinks he's in remission and was doing better before he went on the Humira. Pt states he has tried Humira before for about 3 months and it didn't help. Please advise; I think he wants to stop the Humira.

## 2012-02-16 NOTE — Telephone Encounter (Signed)
If there is been truly no benefit then we can stop Humira. Colectomy was recommended for him initially both by his IBD physician at [facility] and by me on initial consult. He has remained apposed to this and Humira was in an effort to improve symptoms. We need to be sure he is on schedule for surveillance colonoscopy

## 2012-02-23 ENCOUNTER — Other Ambulatory Visit: Payer: Self-pay | Admitting: Internal Medicine

## 2012-02-23 NOTE — Telephone Encounter (Signed)
Informed pt of Dr Lauro Franklin recommendations. He reports he is meeting with an agent about the Part D drug plan today. We discussed his co pay, etc and he will decide on what to do. Correction in earlier note: pt has tried Remicade in the past with no improvement.

## 2012-03-01 ENCOUNTER — Other Ambulatory Visit (HOSPITAL_COMMUNITY): Payer: Self-pay | Admitting: *Deleted

## 2012-03-01 ENCOUNTER — Other Ambulatory Visit: Payer: Self-pay | Admitting: Internal Medicine

## 2012-03-01 MED ORDER — LISINOPRIL-HYDROCHLOROTHIAZIDE 20-12.5 MG PO TABS: 1.0000 | ORAL_TABLET | Freq: Every day | ORAL | Status: DC

## 2012-03-30 ENCOUNTER — Other Ambulatory Visit: Payer: Self-pay | Admitting: Dermatology

## 2012-03-30 DIAGNOSIS — D0460 Carcinoma in situ of skin of unspecified upper limb, including shoulder: Secondary | ICD-10-CM | POA: Diagnosis not present

## 2012-03-30 DIAGNOSIS — M6530 Trigger finger, unspecified finger: Secondary | ICD-10-CM | POA: Diagnosis not present

## 2012-03-30 DIAGNOSIS — L570 Actinic keratosis: Secondary | ICD-10-CM | POA: Diagnosis not present

## 2012-03-30 DIAGNOSIS — D485 Neoplasm of uncertain behavior of skin: Secondary | ICD-10-CM | POA: Diagnosis not present

## 2012-03-30 DIAGNOSIS — Z85828 Personal history of other malignant neoplasm of skin: Secondary | ICD-10-CM | POA: Diagnosis not present

## 2012-04-11 ENCOUNTER — Other Ambulatory Visit: Payer: Self-pay | Admitting: *Deleted

## 2012-04-11 MED ORDER — DILTIAZEM HCL ER 180 MG PO CP24: 180.0000 mg | ORAL_CAPSULE | Freq: Every day | ORAL | Status: DC

## 2012-04-17 ENCOUNTER — Other Ambulatory Visit (HOSPITAL_COMMUNITY): Payer: Self-pay | Admitting: *Deleted

## 2012-04-17 MED ORDER — LISINOPRIL-HYDROCHLOROTHIAZIDE 20-12.5 MG PO TABS: 1.0000 | ORAL_TABLET | Freq: Every day | ORAL | Status: DC

## 2012-05-10 DIAGNOSIS — D0460 Carcinoma in situ of skin of unspecified upper limb, including shoulder: Secondary | ICD-10-CM | POA: Diagnosis not present

## 2012-06-02 ENCOUNTER — Ambulatory Visit (INDEPENDENT_AMBULATORY_CARE_PROVIDER_SITE_OTHER): Payer: Medicare Other | Admitting: Internal Medicine

## 2012-06-02 ENCOUNTER — Encounter: Payer: Self-pay | Admitting: Internal Medicine

## 2012-06-02 VITALS — BP 114/60 | HR 88 | Ht 71.0 in | Wt 249.6 lb

## 2012-06-02 DIAGNOSIS — G4733 Obstructive sleep apnea (adult) (pediatric): Secondary | ICD-10-CM | POA: Diagnosis not present

## 2012-06-02 NOTE — Patient Instructions (Addendum)
Order- [facility]- replacement CPAP supplies and mask of choice as needed        Dx OSA  You can talk with [facility] about when your machine will be eligible for replacement  My staff will try to get old sleep study from [facility]

## 2012-06-02 NOTE — Progress Notes (Signed)
06/02/12- 67 yo m former smoker referred courtesy of Dr. Delton Coombes for obstructive sleep apnea. Dr. Delton Coombes follows for COPD with history of chest trauma complicated by history of ulcerative colitis on immunosuppression, hypertension. Sleep study was done around 1999 at [facility]. We are trying to get that documentation. He has been using CPAP 10 with nasal pillows mask and humidifier. Good compliance and control. Bedtime between 11 PM and midnight with estimated sleep latency 10-15 minutes, waking 3-5 times during the night before up between 7 and 8 AM. He emphasizes that he uses CPAP every night " he depends on it". Former smoker. History of paroxysmal atrial fibrillation only during chest injury. Previous ENT surgery for tonsils.  Prior to Admission medications   Medication Sig Start Date End Date Taking? Authorizing Provider  albuterol (PROVENTIL HFA) 108 (90 BASE) MCG/ACT inhaler Inhale 2 puffs into the lungs every 6 (six) hours as needed.     Yes Historical Provider, MD  aspirin 81 MG tablet Take 81 mg by mouth daily.     Yes Historical Provider, MD  Cholecalciferol (VITAMIN D3) 1000 UNITS CAPS Take 1 capsule by mouth daily.    Yes Historical Provider, MD  Coenzyme Q10 (CO Q-10) 100 MG CAPS Take by mouth daily.     Yes Historical Provider, MD  diltiazem (DILACOR XR) 180 MG 24 hr capsule Take 1 capsule (180 mg total) by mouth daily. 04/11/12  Yes Peter M Swaz[place], MD  fish oil-omega-3 fatty acids 1000 MG capsule Take 2 g by mouth daily.     Yes Historical Provider, MD  lisinopril-hydrochlorothiazide (PRINZIDE,ZESTORETIC) 20-12.5 MG per tablet Take 1 tablet by mouth daily. 04/17/12  Yes Dolores Patty, MD  mercaptopurine (PURINETHOL) 50 MG tablet TAKE 2 TABLETS ONCE A DAY ORALLY 02/04/12  Yes Beverley Fiedler, MD  Multiple Vitamin (MULTIVITAMIN) capsule Take 1 capsule by mouth every other day.    Yes Historical Provider, MD  sulfaSALAzine (AZULFIDINE) 500 MG EC tablet Takes 2 in the a.m. 2 at  lunch 2 at dinner 07/19/11  Yes Beverley Fiedler, MD  SYMBICORT 160-4.5 MCG/ACT inhaler INHALE 2 PUFFS BY MOUTH TWICE A DAY 02/03/12  Yes Leslye Peer, MD  vitamin B-12 (CYANOCOBALAMIN) 250 MCG tablet Take 250 mcg by mouth daily.     Yes Historical Provider, MD  ZETIA 10 MG tablet 1 tablet Daily. 05/22/11  Yes Historical Provider, MD   Past Medical History  Diagnosis Date  . Allergic rhinitis   . Paroxysmal atrial fibrillation   . Hyperlipidemia   . HTN (hypertension)   . Obstructive sleep apnea on CPAP   . Ulcerative colitis   . COPD (chronic obstructive pulmonary disease)   . Ulcerative colitis, left sided   . Diverticulosis   . Skin cancer     basel cell  . GERD (gastroesophageal reflux disease)   . Obesity   . Sleep apnea    Past Surgical History  Procedure Laterality Date  . Back surgery  2005  . Colon polyps    . Fetal surgery for congenital hernia    . Tonsillectomy    . Infection in chest that had to be cleaned out      Staff Infection  . Colonoscopy    . Umbilical hernia repair    . Skin cancer excision    . Belpharoptosis repair      bilateral   Family History  Problem Relation Age of Onset  . Emphysema Father   . Heart disease  Father   . Emphysema Maternal Grandfather   . Heart disease Maternal Grandfather   . Stroke Maternal Grandfather   . Allergies Mother   . Asthma Sister   .     Marland Kitchen Colon cancer Neg Hx   . Esophageal cancer Neg Hx   . Stomach cancer Neg Hx   . Rectal cancer Neg Hx    History   Social History  . Marital Status: Married    Spouse Name: N/A    Number of Children: N/A  . Years of Education: N/A   Occupational History  . Maintenance Manager     [employer]   Social History Main Topics  . Smoking status: Former Smoker -- 1.00 packs/day for 35 years    Types: Cigarettes    Quit date: 04/26/2001  . Smokeless tobacco: Former Neurosurgeon    Quit date: 06/25/1991     Comment: quit in 2005  . Alcohol Use: 2.4 oz/week    4 Cans of beer  per week  . Drug Use: No  . Sexually Active: Not on file   Other Topics Concern  . Not on file   Social History Narrative  . No narrative on file   ROS-see HPI Constitutional:   No-   weight loss, night sweats, fevers, chills, fatigue, lassitude. HEENT:   No-  headaches, difficulty swallowing, tooth/dental problems, sore throat,       No-  sneezing, itching, ear ache, nasal congestion, post nasal drip,  CV:  No-   chest pain, orthopnea, PND, swelling in lower extremities, anasarca,                                  dizziness, palpitations Resp: + shortness of breath with exertion or at rest.              No-   productive cough,  No non-productive cough,  No- coughing up of blood.              No-   change in color of mucus.  No- wheezing.   Skin: No-   rash or lesions. GI:  No-   heartburn, indigestion, GU: No-   dysuria, change in color of urine, no urgency or frequency.  No- flank pain. MS:  No-   joint pain or swelling.  No- decreased range of motion.  No- back pain. Neuro-     nothing unusual Psych:  No- change in mood or affect. No depression or anxiety.  No memory loss.  OBJ- Physical Exam General- Alert, Oriented, Affect-appropriate, Distress- none acute. Overweight Skin- rash-none, lesions- none, excoriation- none Lymphadenopathy- none Head- atraumatic            Eyes- Gross vision intact, PERRLA, conjunctivae and secretions clear            Ears- Hearing, canals-normal            Nose- Clear, no-Septal dev, mucus, polyps, erosion, perforation             Throat- Mallampati IV , mucosa clear , drainage- none, tonsils- atrophic Neck- flexible , trachea midline, no stridor , thyroid nl, carotid no bruit Chest - symmetrical excursion , unlabored           Heart/CV- RRR , no murmur , no gallop  , no rub, nl s1 s2                           -  JVD- none , edema- none, stasis changes- none, varices- none           Lung- + diminished, wheeze- none, +raspy cough , dullness-none,  rub- none           Chest wall-  Abd- tender-no, distended-no, bowel sounds-present, HSM- no Br/ Gen/ Rectal- Not done, not indicated Extrem- cyanosis- none, clubbing, none, atrophy- none, strength- nl Neuro- grossly intact to observation

## 2012-06-10 NOTE — Assessment & Plan Note (Signed)
We will seek original sleep study reports for our file. Plan-prescription for replacement CPAP supplies and machine. We will gather documentation. We discussed documentation requirements, alternatives to CPAP and alternatives to his current mask.

## 2012-07-05 ENCOUNTER — Other Ambulatory Visit: Payer: Self-pay | Admitting: Cardiology

## 2012-07-05 ENCOUNTER — Telehealth: Payer: Self-pay | Admitting: Internal Medicine

## 2012-07-05 ENCOUNTER — Other Ambulatory Visit: Payer: Self-pay | Admitting: Internal Medicine

## 2012-07-05 NOTE — Telephone Encounter (Signed)
New Problem:    Patient called in wanting to know if he was still eligible to see Dr. Haroldine Laws in the CHF clinic.  Please let me know.

## 2012-07-05 NOTE — Telephone Encounter (Signed)
Called and spoke with patient and told him he needs to schedule an appointment before we can refill medications. Explained I would transfere him to scheduling and he can make the appointment then explain he needs a refill so we can refill medications.

## 2012-07-11 NOTE — Telephone Encounter (Signed)
Left message to call back  

## 2012-07-13 ENCOUNTER — Other Ambulatory Visit: Payer: Self-pay | Admitting: Gastroenterology

## 2012-07-13 ENCOUNTER — Other Ambulatory Visit (HOSPITAL_COMMUNITY): Payer: Self-pay | Admitting: Cardiology

## 2012-07-13 ENCOUNTER — Telehealth: Payer: Self-pay | Admitting: Internal Medicine

## 2012-07-13 DIAGNOSIS — I10 Essential (primary) hypertension: Secondary | ICD-10-CM

## 2012-07-13 MED ORDER — LISINOPRIL-HYDROCHLOROTHIAZIDE 20-12.5 MG PO TABS: 1.0000 | ORAL_TABLET | Freq: Every day | ORAL | Status: DC

## 2012-07-13 MED ORDER — DILTIAZEM HCL ER 180 MG PO CP24: 180.0000 mg | ORAL_CAPSULE | Freq: Every day | ORAL | Status: DC

## 2012-07-13 NOTE — Telephone Encounter (Signed)
appt sch °

## 2012-07-13 NOTE — Telephone Encounter (Signed)
Pt wants a Geophysical data processor for his UC; he will try to send me an Email so I can write a letter. Also, he needs a f/u so he will come on 07/24/12.

## 2012-07-13 NOTE — Telephone Encounter (Deleted)
Mailed jury duty letter to [employer] point court house and a copy to pt at address on file as noted in the previous encounter.

## 2012-07-13 NOTE — Telephone Encounter (Signed)
Pt given follow up on 09/11/12 @1015  Med refills given

## 2012-07-13 NOTE — Telephone Encounter (Signed)
Typed and mailed Jury excuse letter to [employer] [address]  [place] regarding juror # [phone] copy also mailed to pt at home address we have on file.

## 2012-07-20 ENCOUNTER — Encounter: Payer: Self-pay | Admitting: Internal Medicine

## 2012-07-24 ENCOUNTER — Other Ambulatory Visit (INDEPENDENT_AMBULATORY_CARE_PROVIDER_SITE_OTHER): Payer: Medicare Other

## 2012-07-24 ENCOUNTER — Telehealth: Payer: Self-pay | Admitting: Gastroenterology

## 2012-07-24 ENCOUNTER — Ambulatory Visit (INDEPENDENT_AMBULATORY_CARE_PROVIDER_SITE_OTHER): Payer: Medicare Other | Admitting: Internal Medicine

## 2012-07-24 ENCOUNTER — Other Ambulatory Visit: Payer: Self-pay | Admitting: Gastroenterology

## 2012-07-24 ENCOUNTER — Encounter: Payer: Self-pay | Admitting: Internal Medicine

## 2012-07-24 VITALS — BP 112/70 | HR 72 | Ht 71.5 in | Wt 245.0 lb

## 2012-07-24 DIAGNOSIS — K5190 Ulcerative colitis, unspecified, without complications: Secondary | ICD-10-CM

## 2012-07-24 DIAGNOSIS — I10 Essential (primary) hypertension: Secondary | ICD-10-CM

## 2012-07-24 DIAGNOSIS — D126 Benign neoplasm of colon, unspecified: Secondary | ICD-10-CM

## 2012-07-24 LAB — COMPREHENSIVE METABOLIC PANEL
ALT: 30 U/L (ref 0–53)
AST: 23 U/L (ref 0–37)
Albumin: 3.8 g/dL (ref 3.5–5.2)
Alkaline Phosphatase: 39 U/L (ref 39–117)
Calcium: 8.9 mg/dL (ref 8.4–10.5)
Chloride: 98 mEq/L (ref 96–112)
Potassium: 3.5 mEq/L (ref 3.5–5.1)
Sodium: 135 mEq/L (ref 135–145)
Total Protein: 6.9 g/dL (ref 6.0–8.3)

## 2012-07-24 LAB — CBC
MCHC: 33.2 g/dL (ref 30.0–36.0)
MCV: 97.9 fl (ref 78.0–100.0)
Platelets: 231 10*3/uL (ref 150.0–400.0)

## 2012-07-24 MED ORDER — SULFASALAZINE 500 MG PO TBEC: 500.0000 mg | DELAYED_RELEASE_TABLET | Freq: Three times a day (TID) | ORAL | Status: DC

## 2012-07-24 MED ORDER — MERCAPTOPURINE 50 MG PO TABS: 50.0000 mg | ORAL_TABLET | Freq: Every day | ORAL | Status: DC

## 2012-07-24 NOTE — Progress Notes (Signed)
Patient ID: Ricardo Cooper, male   DOB: [birth year], 67 y.o.   MRN: [id number] HPI: Ricardo Cooper is a 67 yo male with PMH of left-sided ulcerative colitis diagnosed in 2008, COPD, OSA, hypertension, GERD, history of basal cell carcinoma, and history of adenomatous colon polyps who is seen in follow-up. He was last seen in May 2013. He was previous to taking Humira in conjunction with sulfasalazine and 6-MP,  but the Humira was stopped in late October 2013 due to lack of meaningful response. He returns today stating that his colitis symptoms are "manageable". His stools are formed and only very rarely contain noticeable blood. He reports he usually has gas and a bowel movement on waking in the morning and then 2-3 times throughout the day. He is not having nocturnal stools. He does still have tenesmus. He denies significant abdominal pain. No fevers or chills. Appetite has remained good without significant weight loss.  Past Medical History  Diagnosis Date  . Allergic rhinitis   . Paroxysmal atrial fibrillation   . Hyperlipidemia   . HTN (hypertension)   . Obstructive sleep apnea on CPAP   . Ulcerative colitis   . COPD (chronic obstructive pulmonary disease)   . Ulcerative colitis, left sided   . Diverticulosis   . Skin cancer     basel cell  . GERD (gastroesophageal reflux disease)   . Obesity   . Sleep apnea     Past Surgical History  Procedure Laterality Date  . Back surgery  2005  . Colon polyps    . Fetal surgery for congenital hernia    . Tonsillectomy    . Infection in chest that had to be cleaned out      Staff Infection  . Colonoscopy      Dr. Hilarie Cooper 2013  . Umbilical hernia repair    . Skin cancer excision    . Belpharoptosis repair      bilateral    Current Outpatient Prescriptions  Medication Sig Dispense Refill  . albuterol (PROVENTIL HFA) 108 (90 BASE) MCG/ACT inhaler Inhale 2 puffs into the lungs every 6 (six) hours as needed.        Marland Kitchen aspirin 81 MG tablet Take  81 mg by mouth daily.        . Cholecalciferol 2000 UNITS CAPS Take 1 capsule by mouth daily.      . Coenzyme Q10 (CO Q-10) 100 MG CAPS Take by mouth daily.        Marland Kitchen diltiazem (DILACOR XR) 180 MG 24 hr capsule Take 1 capsule (180 mg total) by mouth daily.  30 capsule  6  . fish oil-omega-3 fatty acids 1000 MG capsule Take 2 g by mouth daily.        Marland Kitchen lisinopril-hydrochlorothiazide (PRINZIDE,ZESTORETIC) 20-12.5 MG per tablet Take 1 tablet by mouth daily.  30 tablet  6  . mercaptopurine (PURINETHOL) 50 MG tablet Take 1 tablet (50 mg total) by mouth daily. Give on an empty stomach 1 hour before or 2 hours after meals. Caution: Chemotherapy.  60 tablet  5  . Multiple Vitamin (MULTIVITAMIN) capsule Take 1 capsule by mouth every other day.       . sulfaSALAzine (AZULFIDINE) 500 MG EC tablet Take 1 tablet (500 mg total) by mouth 3 (three) times daily. Takes 2 in the a.m. 2 at lunch 2 at dinner  180 tablet  5  . SYMBICORT 160-4.5 MCG/ACT inhaler INHALE 2 PUFFS BY MOUTH TWICE A DAY  1 Inhaler  12  .  vitamin B-12 (CYANOCOBALAMIN) 250 MCG tablet Take 250 mcg by mouth daily.        Marland Kitchen ZETIA 10 MG tablet 1 tablet Daily.      . [DISCONTINUED] CORTIFOAM 90 MG rectal foam        No current facility-administered medications for this visit.    Allergies  Allergen Reactions  . Ibuprofen     REACTION: causes colitis flare Pt was told not to take Ibuprofen d/t reaction    Family History  Problem Relation Age of Onset  . Emphysema Father   . Heart disease Father   . Emphysema Maternal Grandfather   . Heart disease Maternal Grandfather   . Stroke Maternal Grandfather   . Allergies Mother   . Asthma Sister   . Colon cancer Neg Hx   . Esophageal cancer Neg Hx   . Stomach cancer Neg Hx   . Rectal cancer Neg Hx     History   Social History  . Marital Status: Married    Spouse Name: N/A    Number of Children: N/A  . Years of Education: N/A   Occupational History  . Maintenance Manager     [employer] [address]   Social History Main Topics  . Smoking status: Former Smoker -- 1.00 packs/day for 35 years    Types: Cigarettes    Quit date: 04/26/2001  . Smokeless tobacco: Former Systems developer    Quit date: 06/25/1991     Comment: quit in 2005  . Alcohol Use: 2.4 oz/week    4 Cans of beer per week  . Drug Use: No  . Sexually Active: None   Other Topics Concern  . None   Social History Narrative  . None    ROS: As per history of present illness, otherwise negative  BP 112/70  Pulse 72  Ht 5' 11.5" (1.816 m)  Wt 245 lb (111.131 kg)  BMI 33.7 kg/m2 Constitutional: Well-developed and well-nourished. No distress.  HEENT: Normocephalic and atraumatic. Oropharynx is clear and moist. No scleral icterus.  Neck: Neck supple. Trachea midline.  Cardiovascular: Normal rate, regular rhythm and intact distal pulses. Pulmonary/chest: Effort normal and breath sounds normal. No wheezing, rales or rhonchi.  Abdominal: Soft, nontender, nondistended. Bowel sounds active throughout.  Extremities: no clubbing, cyanosis, or edema Neurological: Alert and oriented to person place and time.  Skin: Skin is warm and dry. No rashes noted.  Psychiatric: Normal mood and affect. Behavior is normal.   RELEVANT LABS Pending  ASSESSMENT/PLAN:  67 yo male with PMH of left-sided ulcerative colitis diagnosed in 2008, COPD, OSA, hypertension, GERD, history of basal cell carcinoma, and history of adenomatous colon polyps who is seen in follow-up.   1.  Left-sided UC -- he continues to have some symptoms consistent with ongoing active disease, though mild (tenesmus, mucus with intermittent scant blood).  In the past flexion he had been recommended given his ongoing active disease despite medical therapy, but he did not wish to pursue this. He currently feels that his symptoms are "manageable" and does not wish to discuss or pursue surgery at this point. Unfortunately, he has tried TNF alpha inhibitors without rate response  (infliximab and adalimumab). He has remained on 6-MP (max dose possible based on thiopurine metabolite panel) and sulfasalazine.  He also tried topical therapy such as Rowasa without significant improvement.  We discussed future therapies for IBD targeting different molecules. These drugs are likely going to become available, such as Vedolizumab, and we can discuss these  further when they are approved and available. For now he will continue on the current dose of sulfasalazine and 6-MP.  2.  History of adenomatous colon polyps -- he had 4 adenomatous colon polyps removed during his last colonoscopy in March 2013. He also had pseudopolyps in his diseased segment of the left colon. None of the polyps that were removed from the actively inflamed segment were adenomatous.  Timing of repeat colonoscopy is difficult getting his likely ongoing active disease. Will plan to repeat the colonoscopy in October 2014 for surveillance.  Should he develop any worsening signs of colitis he is asked to call us immediately and he voices understanding. He is due for labs today to include CBC and CMP.

## 2012-07-24 NOTE — Telephone Encounter (Signed)
Spoke to pt. Told him Dr. Rhea Belton ordered some labs for him to be drawn. To stop down to our lab in the basement at his leisure. Pt stated understanding and will stop by sometime this week.

## 2012-07-24 NOTE — Patient Instructions (Addendum)
We have sent the following medications to your pharmacy for you to pick up at your convenience: has been refilled and Sulfasalazine  Dr. Rhea Belton recommends you have a repeat colonoscopy in October of 2014

## 2012-08-02 ENCOUNTER — Other Ambulatory Visit: Payer: Self-pay | Admitting: Internal Medicine

## 2012-08-23 ENCOUNTER — Ambulatory Visit (INDEPENDENT_AMBULATORY_CARE_PROVIDER_SITE_OTHER): Payer: Medicare Other | Admitting: Adult Health

## 2012-08-23 ENCOUNTER — Ambulatory Visit (INDEPENDENT_AMBULATORY_CARE_PROVIDER_SITE_OTHER)
Admission: RE | Admit: 2012-08-23 | Discharge: 2012-08-23 | Disposition: A | Discharge status: Home / Self Care | Payer: Medicare Other | Source: Ambulatory Visit | Attending: Adult Health | Admitting: Adult Health

## 2012-08-23 ENCOUNTER — Encounter: Payer: Self-pay | Admitting: Adult Health

## 2012-08-23 VITALS — BP 120/66 | HR 104 | Temp 100.8°F | Ht 71.0 in | Wt 244.6 lb

## 2012-08-23 DIAGNOSIS — J449 Chronic obstructive pulmonary disease, unspecified: Secondary | ICD-10-CM

## 2012-08-23 MED ORDER — LEVALBUTEROL HCL 0.63 MG/3ML IN NEBU
0.6300 mg | INHALATION_SOLUTION | Freq: Once | RESPIRATORY_TRACT | Status: AC
  Administered 2012-08-23: 0.63 mg via RESPIRATORY_TRACT

## 2012-08-23 MED ORDER — CEFDINIR 300 MG PO CAPS: 300.0000 mg | ORAL_CAPSULE | Freq: Two times a day (BID) | ORAL | Status: DC
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
## 2012-08-23 NOTE — Addendum Note (Signed)
Addended by: Boone Master E on: 08/23/2012 03:19 PM   Modules accepted: Orders

## 2012-08-23 NOTE — Patient Instructions (Addendum)
Omnicef 300mg  Twice daily  For 7 days  Mucinex DM Twice daily  As needed  Cough/congestion  Fluids and rest  Tylenol As needed   Please contact office for sooner follow up if symptoms do not improve or worsen or seek emergency care  Follow up Dr. Delton Coombes  As planned and As needed

## 2012-08-23 NOTE — Assessment & Plan Note (Signed)
Flare  CXR with no acute changes   Plan  Omnicef 332m Twice daily  For 7 days  Mucinex DM Twice daily  As needed  Cough/congestion  Fluids and rest  Tylenol As needed   Please contact office for sooner follow up if symptoms do not improve or worsen or seek emergency care  Follow up Dr. BLamonte Sakai As planned and As needed

## 2012-08-26 ENCOUNTER — Telehealth: Payer: Self-pay | Admitting: Pulmonary Disease

## 2012-08-26 NOTE — Telephone Encounter (Signed)
08/26/12 1000 am.    "I just do not feel better"  Pt called with c/o of being up all night with cough, sputum production.  Continues to have low grade fever of 100.3.  Has not taken tylenol.  Mild SOB with activity.  Continues to take abx.  Noted clear CXR on 4/30 & office visit with TP / followed by Dr. Delton Coombes.    Plan: -pt to continue abx as previously ordered -tylenol Q8 PRN aches / fever -Mucinex PRN  -instructed to drink plenty of water for hydration -instructed if worsens to call answering service to alert and and report to ER -Otherwise, please call for Appt on Monday 5/5 to be seen by TP or [place]   Canary Brim, NP-C [facility] Pgr: [phone] or [phone]

## 2012-08-28 ENCOUNTER — Ambulatory Visit (INDEPENDENT_AMBULATORY_CARE_PROVIDER_SITE_OTHER): Payer: Medicare Other | Admitting: Adult Health

## 2012-08-28 ENCOUNTER — Telehealth: Payer: Self-pay | Admitting: Internal Medicine

## 2012-08-28 VITALS — BP 120/72 | HR 98 | Temp 99.1°F | Ht 71.5 in | Wt 242.0 lb

## 2012-08-28 DIAGNOSIS — J209 Acute bronchitis, unspecified: Secondary | ICD-10-CM | POA: Diagnosis not present

## 2012-08-28 MED ORDER — PREDNISONE 10 MG PO TABS: ORAL_TABLET | ORAL | Status: DC

## 2012-08-28 MED ORDER — HYDROCODONE-HOMATROPINE 5-1.5 MG/5ML PO SYRP: 5.0000 mL | ORAL_SOLUTION | Freq: Four times a day (QID) | ORAL | Status: DC | PRN

## 2012-08-28 MED ORDER — CEFDINIR 300 MG PO CAPS: 300.0000 mg | ORAL_CAPSULE | Freq: Two times a day (BID) | ORAL | Status: DC
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
## 2012-08-28 NOTE — Patient Instructions (Addendum)
Extend Omnicef for additional 3 days .  Mucinex DM Twice daily  As needed  Cough/congestion  Prednisone taper over next week.  Hydromet 1 tsp every 6 hr As needed  Cough, may make you sleepy.  Sugarless candy and sips of water , to soothe throat.  Fluids and rest  Hold fish oil until cough is better Discuss with cardiologist regarding ACE inhibitor that may be making your cough worse.  Please contact office for sooner follow up if symptoms do not improve or worsen or seek emergency care  follow up Dr. Lamonte Sakai  6 weeks and As needed

## 2012-08-28 NOTE — Assessment & Plan Note (Signed)
Slow to resolve exacerbation  cxr w/ nad.  ? ACE inhibitor contributing to excessive cough  Plan  Extend Omnicef for additional 3 days .  Mucinex DM Twice daily  As needed  Cough/congestion  Prednisone taper over next week.  Hydromet 1 tsp every 6 hr As needed  Cough, may make you sleepy.  Sugarless candy and sips of water , to soothe throat.  Fluids and rest  Hold fish oil until cough is better Discuss with cardiologist regarding ACE inhibitor that may be making your cough worse.  Please contact office for sooner follow up if symptoms do not improve or worsen or seek emergency care  follow up Dr. Delton Coombes  6 weeks and As needed

## 2012-08-28 NOTE — Telephone Encounter (Signed)
Pt called on call doc over the weekend. Was advised to call and be seen today since no better. Pt scheduled appt to see TP this afternoon at 3:45. Nothing further was needed

## 2012-08-31 DIAGNOSIS — J209 Acute bronchitis, unspecified: Secondary | ICD-10-CM | POA: Diagnosis not present

## 2012-08-31 MED ORDER — LEVALBUTEROL HCL 0.63 MG/3ML IN NEBU
0.6300 mg | INHALATION_SOLUTION | Freq: Once | RESPIRATORY_TRACT | Status: AC
  Administered 2012-08-31: 0.63 mg via RESPIRATORY_TRACT

## 2012-08-31 NOTE — Addendum Note (Signed)
Addended by: Parke Poisson E on: 08/31/2012 12:28 PM   Modules accepted: Orders

## 2012-09-06 ENCOUNTER — Telehealth: Payer: Self-pay | Admitting: Emergency Medicine

## 2012-09-06 NOTE — Telephone Encounter (Signed)
Pt seen by TP 5.5.14 for bronchitis: Patient Instructions    Extend Omnicef for additional 3 days .  Mucinex DM Twice daily As needed Cough/congestion  Prednisone taper over next week.  Hydromet 1 tsp every 6 hr As needed Cough, may make you sleepy.  Sugarless candy and sips of water , to soothe throat.  Fluids and rest  Hold fish oil until cough is better  Discuss with cardiologist regarding ACE inhibitor that may be making your cough worse.  Please contact office for sooner follow up if symptoms do not improve or worsen or seek emergency care  follow up Dr. Delton Coombes 6 weeks and As needed    Called spoke with spouse who reports pt is having prod cough, DOE, chest tightness, some occasional wheezing and low grade temp up to 99.7  x2-3days.  Still using hydromet prn and finished the pred taper 2 days ago.  Pt is at [facility] >> will use [facility] (wife will get exact pharmacy address by the time we call back).  Requesting recs from RB.  Please advise, thanks!  Allergies  Allergen Reactions  . Ibuprofen     REACTION: causes colitis flare Pt was told not to take Ibuprofen d/t reaction

## 2012-09-06 NOTE — Telephone Encounter (Signed)
Spoke with the pt and his spouse and notified of recs per RB They verbalized understanding and states nothing further needed

## 2012-09-06 NOTE — Telephone Encounter (Signed)
Not clear that more abx or prednisone will help here. Would continue symbicort as he usually takes it. Try using decongestants containing chlorpheniramine or brompheniramine (OTC from pharmacy). If he develops fever > 101.5, more SOB, more cough or sputum then he should probably be seen at urgent care.

## 2012-09-11 ENCOUNTER — Ambulatory Visit (HOSPITAL_COMMUNITY)
Admission: RE | Admit: 2012-09-11 | Discharge: 2012-09-11 | Disposition: A | Discharge status: Home / Self Care | Payer: Medicare Other | Source: Ambulatory Visit | Attending: Internal Medicine | Admitting: Internal Medicine

## 2012-09-11 ENCOUNTER — Encounter (HOSPITAL_COMMUNITY): Payer: Self-pay

## 2012-09-11 VITALS — BP 110/58 | HR 101 | Wt 235.8 lb

## 2012-09-11 DIAGNOSIS — J209 Acute bronchitis, unspecified: Secondary | ICD-10-CM | POA: Diagnosis not present

## 2012-09-11 DIAGNOSIS — I10 Essential (primary) hypertension: Secondary | ICD-10-CM | POA: Diagnosis not present

## 2012-09-11 DIAGNOSIS — J40 Bronchitis, not specified as acute or chronic: Secondary | ICD-10-CM | POA: Insufficient documentation

## 2012-09-11 DIAGNOSIS — I4510 Unspecified right bundle-branch block: Secondary | ICD-10-CM | POA: Insufficient documentation

## 2012-09-11 DIAGNOSIS — I4891 Unspecified atrial fibrillation: Secondary | ICD-10-CM | POA: Diagnosis not present

## 2012-09-11 MED ORDER — LOSARTAN POTASSIUM-HCTZ 50-12.5 MG PO TABS: 1.0000 | ORAL_TABLET | Freq: Every day | ORAL | Status: DC

## 2012-09-11 MED ORDER — MOXIFLOXACIN HCL 400 MG PO TABS: 400.0000 mg | ORAL_TABLET | Freq: Every day | ORAL | Status: DC
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
## 2012-09-11 NOTE — Patient Instructions (Addendum)
Start Avelox 400 mg daily for 7 days  Stop Lisinopril  Start Hyzaar 50/12.5 mg daily  We will contact you in 1 year to schedule your next appointment.

## 2012-09-11 NOTE — Assessment & Plan Note (Signed)
Well controlled. Given lung disease and hoarseness will switch lisinopril HCTZ to Hyzaar 50/12.5.
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
## 2012-09-11 NOTE — Assessment & Plan Note (Addendum)
Improved slightly but still linger with infectious symptoms. Will talk with Pulmonary about a course of Avelox.   D/w Tammy Parrett who agrees with Avelox 400 daily x 7 days.

## 2012-09-13 NOTE — Addendum Note (Signed)
Encounter addended by: Sanda Linger, CCT on: 09/13/2012  8:58 AM<BR>     Documentation filed: Charges VN

## 2012-09-25 DIAGNOSIS — H2510 Age-related nuclear cataract, unspecified eye: Secondary | ICD-10-CM | POA: Diagnosis not present

## 2012-10-10 ENCOUNTER — Ambulatory Visit (INDEPENDENT_AMBULATORY_CARE_PROVIDER_SITE_OTHER): Payer: Medicare Other | Admitting: Emergency Medicine

## 2012-10-10 ENCOUNTER — Encounter: Payer: Self-pay | Admitting: Emergency Medicine

## 2012-10-10 VITALS — BP 134/70 | HR 85 | Temp 98.5°F | Ht 72.0 in | Wt 237.2 lb

## 2012-10-10 DIAGNOSIS — G4733 Obstructive sleep apnea (adult) (pediatric): Secondary | ICD-10-CM | POA: Diagnosis not present

## 2012-10-10 DIAGNOSIS — J449 Chronic obstructive pulmonary disease, unspecified: Secondary | ICD-10-CM | POA: Diagnosis not present
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
## 2012-10-10 NOTE — Assessment & Plan Note (Signed)
On CPAP, tolerating well

## 2012-10-10 NOTE — Patient Instructions (Addendum)
Please continue your Symbicort twice a day  Use albuterol as needed Follow with Dr Lamonte Sakai in 6 months or sooner if you have any problems

## 2012-10-10 NOTE — Assessment & Plan Note (Signed)
Improved since last time when he was rx for AE. Rare SABA use.  - continue same inhaled regimen

## 2012-10-31 ENCOUNTER — Telehealth: Payer: Self-pay | Admitting: Internal Medicine

## 2012-10-31 ENCOUNTER — Other Ambulatory Visit: Payer: Self-pay | Admitting: Gastroenterology

## 2012-10-31 MED ORDER — MERCAPTOPURINE 50 MG PO TABS: 50.0000 mg | ORAL_TABLET | Freq: Two times a day (BID) | ORAL | Status: DC

## 2012-10-31 NOTE — Telephone Encounter (Signed)
Sent refill to pt's pharmacy.  

## 2012-11-10 ENCOUNTER — Encounter: Payer: Self-pay | Admitting: Internal Medicine

## 2012-11-17 DIAGNOSIS — Z125 Encounter for screening for malignant neoplasm of prostate: Secondary | ICD-10-CM | POA: Diagnosis not present

## 2012-11-17 DIAGNOSIS — R82998 Other abnormal findings in urine: Secondary | ICD-10-CM | POA: Diagnosis not present

## 2012-11-17 DIAGNOSIS — R7301 Impaired fasting glucose: Secondary | ICD-10-CM | POA: Diagnosis not present

## 2012-11-17 DIAGNOSIS — I10 Essential (primary) hypertension: Secondary | ICD-10-CM | POA: Diagnosis not present

## 2012-11-17 DIAGNOSIS — E785 Hyperlipidemia, unspecified: Secondary | ICD-10-CM | POA: Diagnosis not present

## 2012-11-23 ENCOUNTER — Encounter: Payer: Self-pay | Admitting: Internal Medicine

## 2012-11-23 ENCOUNTER — Other Ambulatory Visit: Payer: Self-pay | Admitting: Dermatology

## 2012-11-23 DIAGNOSIS — D485 Neoplasm of uncertain behavior of skin: Secondary | ICD-10-CM | POA: Diagnosis not present

## 2012-11-23 DIAGNOSIS — D1801 Hemangioma of skin and subcutaneous tissue: Secondary | ICD-10-CM | POA: Diagnosis not present

## 2012-11-23 DIAGNOSIS — D0460 Carcinoma in situ of skin of unspecified upper limb, including shoulder: Secondary | ICD-10-CM | POA: Diagnosis not present

## 2012-11-23 DIAGNOSIS — Z85828 Personal history of other malignant neoplasm of skin: Secondary | ICD-10-CM | POA: Diagnosis not present

## 2012-11-23 DIAGNOSIS — D045 Carcinoma in situ of skin of trunk: Secondary | ICD-10-CM | POA: Diagnosis not present

## 2012-11-23 DIAGNOSIS — L819 Disorder of pigmentation, unspecified: Secondary | ICD-10-CM | POA: Diagnosis not present

## 2012-11-23 DIAGNOSIS — C44711 Basal cell carcinoma of skin of unspecified lower limb, including hip: Secondary | ICD-10-CM | POA: Diagnosis not present

## 2012-11-23 DIAGNOSIS — L821 Other seborrheic keratosis: Secondary | ICD-10-CM | POA: Diagnosis not present

## 2012-11-23 DIAGNOSIS — L570 Actinic keratosis: Secondary | ICD-10-CM | POA: Diagnosis not present

## 2012-11-24 ENCOUNTER — Other Ambulatory Visit: Payer: Self-pay | Admitting: Internal Medicine

## 2012-11-24 DIAGNOSIS — Z0000 Encounter for general adult medical examination without abnormal findings: Secondary | ICD-10-CM | POA: Diagnosis not present

## 2012-11-24 DIAGNOSIS — Z125 Encounter for screening for malignant neoplasm of prostate: Secondary | ICD-10-CM | POA: Diagnosis not present

## 2012-11-24 DIAGNOSIS — E785 Hyperlipidemia, unspecified: Secondary | ICD-10-CM | POA: Diagnosis not present

## 2012-11-24 DIAGNOSIS — R7301 Impaired fasting glucose: Secondary | ICD-10-CM | POA: Diagnosis not present

## 2012-11-24 DIAGNOSIS — E8881 Metabolic syndrome: Secondary | ICD-10-CM | POA: Diagnosis not present

## 2012-11-24 DIAGNOSIS — G4733 Obstructive sleep apnea (adult) (pediatric): Secondary | ICD-10-CM | POA: Diagnosis not present

## 2012-11-24 DIAGNOSIS — I10 Essential (primary) hypertension: Secondary | ICD-10-CM | POA: Diagnosis not present

## 2012-11-24 DIAGNOSIS — I714 Abdominal aortic aneurysm, without rupture, unspecified: Secondary | ICD-10-CM

## 2012-11-24 DIAGNOSIS — J449 Chronic obstructive pulmonary disease, unspecified: Secondary | ICD-10-CM | POA: Diagnosis not present

## 2012-11-24 DIAGNOSIS — Z1331 Encounter for screening for depression: Secondary | ICD-10-CM | POA: Diagnosis not present

## 2012-11-24 DIAGNOSIS — K5190 Ulcerative colitis, unspecified, without complications: Secondary | ICD-10-CM | POA: Diagnosis not present

## 2012-11-24 DIAGNOSIS — Z23 Encounter for immunization: Secondary | ICD-10-CM | POA: Diagnosis not present

## 2012-11-29 ENCOUNTER — Ambulatory Visit
Admission: RE | Admit: 2012-11-29 | Discharge: 2012-11-29 | Disposition: A | Discharge status: Home / Self Care | Payer: Medicare Other | Source: Ambulatory Visit | Attending: Internal Medicine | Admitting: Internal Medicine

## 2012-11-29 ENCOUNTER — Other Ambulatory Visit: Payer: Self-pay

## 2012-11-29 DIAGNOSIS — I714 Abdominal aortic aneurysm, without rupture, unspecified: Secondary | ICD-10-CM | POA: Diagnosis not present

## 2013-01-16 ENCOUNTER — Ambulatory Visit (AMBULATORY_SURGERY_CENTER): Payer: Self-pay

## 2013-01-16 VITALS — Ht 72.0 in | Wt 239.4 lb

## 2013-01-16 DIAGNOSIS — Z86010 Personal history of colonic polyps: Secondary | ICD-10-CM

## 2013-01-16 DIAGNOSIS — K5190 Ulcerative colitis, unspecified, without complications: Secondary | ICD-10-CM

## 2013-01-16 MED ORDER — MOVIPREP 100 G PO SOLR: ORAL | Status: DC

## 2013-01-21 ENCOUNTER — Other Ambulatory Visit: Payer: Self-pay | Admitting: Internal Medicine

## 2013-01-25 ENCOUNTER — Telehealth: Payer: Self-pay | Admitting: Internal Medicine

## 2013-01-25 NOTE — Telephone Encounter (Signed)
Pt had dental implant since PV and wanted to let us know that he is taking Cephalexin and will be taking the day of prep and procedure.

## 2013-01-28 ENCOUNTER — Other Ambulatory Visit (HOSPITAL_COMMUNITY): Payer: Self-pay | Admitting: Internal Medicine

## 2013-01-30 ENCOUNTER — Ambulatory Visit (AMBULATORY_SURGERY_CENTER): Payer: Medicare Other | Admitting: Internal Medicine

## 2013-01-30 ENCOUNTER — Encounter: Payer: Self-pay | Admitting: Internal Medicine

## 2013-01-30 VITALS — BP 126/74 | HR 75 | Temp 97.2°F | Resp 13 | Ht 72.0 in | Wt 239.0 lb

## 2013-01-30 DIAGNOSIS — Z86010 Personal history of colonic polyps: Secondary | ICD-10-CM | POA: Diagnosis not present

## 2013-01-30 DIAGNOSIS — D126 Benign neoplasm of colon, unspecified: Secondary | ICD-10-CM

## 2013-01-30 DIAGNOSIS — K5190 Ulcerative colitis, unspecified, without complications: Secondary | ICD-10-CM | POA: Diagnosis not present

## 2013-01-30 DIAGNOSIS — G4733 Obstructive sleep apnea (adult) (pediatric): Secondary | ICD-10-CM | POA: Diagnosis not present

## 2013-01-30 DIAGNOSIS — I4891 Unspecified atrial fibrillation: Secondary | ICD-10-CM | POA: Diagnosis not present

## 2013-01-30 DIAGNOSIS — I10 Essential (primary) hypertension: Secondary | ICD-10-CM | POA: Diagnosis not present

## 2013-01-30 DIAGNOSIS — K5150 Left sided colitis without complications: Secondary | ICD-10-CM

## 2013-01-30 DIAGNOSIS — Z1211 Encounter for screening for malignant neoplasm of colon: Secondary | ICD-10-CM | POA: Diagnosis not present

## 2013-01-30 DIAGNOSIS — J449 Chronic obstructive pulmonary disease, unspecified: Secondary | ICD-10-CM | POA: Diagnosis not present

## 2013-01-30 MED ORDER — SODIUM CHLORIDE 0.9 % IV SOLN: 500.0000 mL | INTRAVENOUS | Status: DC

## 2013-01-30 NOTE — Progress Notes (Signed)
Patient did not have preoperative order for IV antibiotic SSI prophylaxis. (G8918)  Patient did not experience any of the following events: a burn prior to discharge; a fall within the facility; wrong site/side/patient/procedure/implant event; or a hospital transfer or hospital admission upon discharge from the facility. (G8907)  

## 2013-01-30 NOTE — Op Note (Signed)
[facility] [address] [place], [address]   COLONOSCOPY PROCEDURE REPORT  PATIENT: Ricardo Cooper, Ricardo Cooper  MR#: [id number] BIRTHDATE: [birth year] , 66  yrs. old GENDER: Male ENDOSCOPIST: Beverley Fiedler, MD PROCEDURE DATE:  01/30/2013 PROCEDURE:   Colonoscopy with snare polypectomy First Screening Colonoscopy - Avg.  risk and is 50 yrs.  old or older - No.  Prior Negative Screening - Now for repeat screening. N/A  History of Adenoma - Now for follow-up colonoscopy & has been > or = to 3 yrs.  N/A  Polyps Removed Today? Yes. ASA CLASS:   Class III INDICATIONS:follow up for previously diagnosed left sided ulcerative colitis with history of pseudopolyps/granulation polyps and Patient's personal history of adenomatous colon polyps. MEDICATIONS: MAC sedation, administered by CRNA, Propofol (Diprivan), and Propofol (Diprivan) 550 mg IV  DESCRIPTION OF PROCEDURE:   After the risks benefits and alternatives of the procedure were thoroughly explained, informed consent was obtained.  A digital rectal exam revealed no rectal mass.   The LB [id number]-[id number]  endoscope was introduced through the anus and advanced to the cecum, which was identified by both the appendix and ileocecal valve. No adverse events experienced. The quality of the prep was good, using MoviPrep  The instrument was then slowly withdrawn as the colon was fully examined.      COLON FINDINGS: Circumferential, mild, colitis was found in the rectum and distal sigmoid colon to approximately 25 cm.  The mucosa was edematous, erythematous with granularity and loss of vascularity.  This was consistent with known ulcerative colitis disease.   In the segment of  ulcerative colitis, eight sessile and pedunculated polyps measuring 5-15 mm in size were found in the distal sigmoid colon (biopsies in this location previously revealed pseudopolyps).  Polypectomy was performed using hot snare to exclude dysplastic and  adenomatous change.  All resections were complete and all polyp tissue was completely retrieved.   Seven sessile and pedunculated polyps measuring 5-20 mm in size were found in the rectum.  Polypectomy was performed using hot snare to exclude dysplastic and adenomatous change.  All resections were complete and all polyp tissue was completely retrieved.   A sessile polyp measuring 5 mm in size was found in the ascending colon.  A polypectomy was performed with a cold snare.  The resection was complete and the polyp tissue was completely retrieved.   Moderate diverticulosis was noted in the descending colon and sigmoid colon. Retroflexed views revealed no abnormalities. The time to cecum=3 minutes 14 seconds.  Withdrawal time=31 minutes 21 seconds.  The scope was withdrawn and the procedure completed.  COMPLICATIONS: There were no complications.  ENDOSCOPIC IMPRESSION: 1.   Mild ulcerative colitis distal sigmoid and rectum, extending to 25 cm from the dentate line 2.   Eight sessile and pedunculated polyps measuring 5-15 mm in size were found in the distal sigmoid colon; Polypectomy was performed using hot snare (Jar 2) 3.   Seven sessile and pedunculated polyps measuring 5-20 mm in size were found in the rectum; Polypectomy was performed using hot snare (Jar 3) 4.   Sessile polyp measuring 5 mm in size was found in the ascending colon; polypectomy was performed with a cold snare (Jar 1) 5.   Moderate diverticulosis was noted in the descending colon and sigmoid colon  RECOMMENDATIONS: 1.  Hold aspirin, aspirin products, and anti-inflammatory medication for 2 weeks. 2.  Await pathology results 3.  You will receive a letter within 1-2 weeks with  the results of your biopsy as well as final recommendations.  Please call my office if you have not received a letter after 3 weeks.   eSigned:  Beverley Fiedler, MD 01/30/2013 12:40 PM   cc: The Patient and W.  Buren Kos, MD   PATIENT  NAME:  Ricardo Cooper, Ricardo Cooper MR#: [id number]

## 2013-01-30 NOTE — Progress Notes (Signed)
I gave report to Felecia Shelling, RN to resume care of the pt. Maw

## 2013-01-30 NOTE — Progress Notes (Signed)
Called to room to assist during endoscopic procedure.  Patient ID and intended procedure confirmed with present staff. Received instructions for my participation in the procedure from the performing physician.  

## 2013-01-30 NOTE — Progress Notes (Signed)
Report to pacu rn, vss, bbs=clear 

## 2013-01-30 NOTE — Patient Instructions (Addendum)
YOU HAD AN ENDOSCOPIC PROCEDURE TODAY AT THE [facility]: Refer to the procedure report that was given to you for any specific questions about what was found during the examination.  If the procedure report does not answer your questions, please call your gastroenterologist to clarify.  If you requested that your care partner not be given the details of your procedure findings, then the procedure report has been included in a sealed envelope for you to review at your convenience later.  YOU SHOULD EXPECT: Some feelings of bloating in the abdomen. Passage of more gas than usual.  Walking can help get rid of the air that was put into your GI tract during the procedure and reduce the bloating. If you had a lower endoscopy (such as a colonoscopy or flexible sigmoidoscopy) you may notice spotting of blood in your stool or on the toilet paper. If you underwent a bowel prep for your procedure, then you may not have a normal bowel movement for a few days.  DIET: Your first meal following the procedure should be a light meal and then it is ok to progress to your normal diet.  A half-sandwich or bowl of soup is an example of a good first meal.  Heavy or fried foods are harder to digest and may make you feel nauseous or bloated.  Likewise meals heavy in dairy and vegetables can cause extra gas to form and this can also increase the bloating.  Drink plenty of fluids but you should avoid alcoholic beverages for 24 hours.  ACTIVITY: Your care partner should take you home directly after the procedure.  You should plan to take it easy, moving slowly for the rest of the day.  You can resume normal activity the day after the procedure however you should NOT DRIVE or use heavy machinery for 24 hours (because of the sedation medicines used during the test).    SYMPTOMS TO REPORT IMMEDIATELY: A gastroenterologist can be reached at any hour.  During normal business hours, 8:30 AM to 5:00 PM Monday through Friday,  call [phone].  After hours and on weekends, please call the GI answering service at [phone] who will take a message and have the physician on call contact you.   Following lower endoscopy (colonoscopy or flexible sigmoidoscopy):  Excessive amounts of blood in the stool  Significant tenderness or worsening of abdominal pains  Swelling of the abdomen that is new, acute  Fever of 100F or higher   FOLLOW UP: If any biopsies were taken you will be contacted by phone or by letter within the next 1-3 weeks.  Call your gastroenterologist if you have not heard about the biopsies in 3 weeks.  Our staff will call the home number listed on your records the next business day following your procedure to check on you and address any questions or concerns that you may have at that time regarding the information given to you following your procedure. This is a courtesy call and so if there is no answer at the home number and we have not heard from you through the emergency physician on call, we will assume that you have returned to your regular daily activities without incident.  SIGNATURES/CONFIDENTIALITY: You and/or your care partner have signed paperwork which will be entered into your electronic medical record.  These signatures attest to the fact that that the information above on your After Visit Summary has been reviewed and is understood.  Full responsibility of the confidentiality of   this discharge information lies with you and/or your care-partner.    Handouts were given to your care partner on polyps, diverticulosis, high fiber diet and ulcerative colitis. Hold aspirin, aspirin products and any anti-inflammatory medication for 2 weeks.  You may resume your other current medications today. Please call if any questions or concerns.

## 2013-01-31 ENCOUNTER — Telehealth: Payer: Self-pay | Admitting: *Deleted

## 2013-01-31 NOTE — Telephone Encounter (Signed)
  Follow up Call-  Call back number 01/30/2013 06/28/2011  Post procedure Call Back phone  # [phone] hm  Permission to leave phone message Yes Yes     Patient questions:  Do you have a fever, pain , or abdominal swelling? no Pain Score  0 *  Have you tolerated food without any problems? yes  Have you been able to return to your normal activities? yes  Do you have any questions about your discharge instructions: Diet   no Medications  no Follow up visit  no  Do you have questions or concerns about your Care? no  Actions: * If pain score is 4 or above: No action needed, pain <4.

## 2013-02-07 ENCOUNTER — Encounter: Payer: Self-pay | Admitting: Internal Medicine

## 2013-02-12 DIAGNOSIS — Z23 Encounter for immunization: Secondary | ICD-10-CM | POA: Diagnosis not present

## 2013-02-13 ENCOUNTER — Other Ambulatory Visit: Payer: Self-pay | Admitting: Internal Medicine

## 2013-02-13 ENCOUNTER — Other Ambulatory Visit: Payer: Self-pay | Admitting: Emergency Medicine

## 2013-02-22 ENCOUNTER — Telehealth: Payer: Self-pay | Admitting: Internal Medicine

## 2013-02-22 NOTE — Telephone Encounter (Signed)
Glad to hear, perhaps removing some of the inflammatory polyps in the distal colon has resulted in less rectal bleeding. Follow-up as previously recommended

## 2013-02-22 NOTE — Telephone Encounter (Signed)
Tried to call pt, but no answer. Dr Rhea Belton states he's better since the colon.

## 2013-03-01 ENCOUNTER — Other Ambulatory Visit: Payer: Self-pay

## 2013-03-30 ENCOUNTER — Other Ambulatory Visit: Payer: Self-pay | Admitting: Dermatology

## 2013-03-30 DIAGNOSIS — D692 Other nonthrombocytopenic purpura: Secondary | ICD-10-CM | POA: Diagnosis not present

## 2013-03-30 DIAGNOSIS — D485 Neoplasm of uncertain behavior of skin: Secondary | ICD-10-CM | POA: Diagnosis not present

## 2013-03-30 DIAGNOSIS — L570 Actinic keratosis: Secondary | ICD-10-CM | POA: Diagnosis not present

## 2013-03-30 DIAGNOSIS — D0460 Carcinoma in situ of skin of unspecified upper limb, including shoulder: Secondary | ICD-10-CM | POA: Diagnosis not present

## 2013-03-30 DIAGNOSIS — Z85828 Personal history of other malignant neoplasm of skin: Secondary | ICD-10-CM | POA: Diagnosis not present

## 2013-04-12 ENCOUNTER — Ambulatory Visit (INDEPENDENT_AMBULATORY_CARE_PROVIDER_SITE_OTHER): Payer: Medicare Other | Admitting: Emergency Medicine

## 2013-04-12 ENCOUNTER — Encounter: Payer: Self-pay | Admitting: Emergency Medicine

## 2013-04-12 VITALS — BP 130/78 | HR 85 | Ht 71.0 in | Wt 244.6 lb

## 2013-04-12 DIAGNOSIS — J449 Chronic obstructive pulmonary disease, unspecified: Secondary | ICD-10-CM

## 2013-04-12 DIAGNOSIS — G4733 Obstructive sleep apnea (adult) (pediatric): Secondary | ICD-10-CM | POA: Diagnosis not present
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
## 2013-04-12 NOTE — Assessment & Plan Note (Signed)
We will consider doing a trial in future of stopping the symbicort to see if he misses it. For now will continue.  - same meds for now.  - spiro today.

## 2013-04-12 NOTE — Assessment & Plan Note (Signed)
CPAP qhs, in good repair, due for replacement next year

## 2013-04-12 NOTE — Patient Instructions (Signed)
Please continue your Symbicort and albuterol for now. We will consider stopping the symbicort at some point in the future to see if you miss it.  Have albuterol available to use as needed Wear your CPAP every night We will repeat your spirometry to compare with 2008 We will confirm the date of your pneumovax from Dr Raul Del office  Follow with Dr Lamonte Sakai in 6 months or sooner if you have any problems

## 2013-04-30 ENCOUNTER — Other Ambulatory Visit: Payer: Self-pay | Admitting: Internal Medicine

## 2013-06-04 ENCOUNTER — Ambulatory Visit (INDEPENDENT_AMBULATORY_CARE_PROVIDER_SITE_OTHER): Payer: Medicare Other | Admitting: Internal Medicine

## 2013-06-04 ENCOUNTER — Encounter: Payer: Self-pay | Admitting: Internal Medicine

## 2013-06-04 VITALS — BP 118/74 | HR 92 | Ht 71.0 in | Wt 251.8 lb

## 2013-06-04 DIAGNOSIS — J449 Chronic obstructive pulmonary disease, unspecified: Secondary | ICD-10-CM | POA: Diagnosis not present

## 2013-06-04 DIAGNOSIS — G4733 Obstructive sleep apnea (adult) (pediatric): Secondary | ICD-10-CM

## 2013-06-04 NOTE — Progress Notes (Signed)
06/02/12- 68 yo m former smoker referred courtesy of Dr. Lamonte Sakai for obstructive sleep apnea. Dr. Lamonte Sakai follows for COPD with history of chest trauma complicated by history of ulcerative colitis on immunosuppression, hypertension. Sleep study was done around 1999 at [facility]. We are trying to get that documentation. He has been using CPAP 10 with nasal pillows mask and humidifier. Good compliance and control. Bedtime between 11 PM and midnight with estimated sleep latency 10-15 minutes, waking 3-5 times during the night before up between 7 and 8 AM. He emphasizes that he uses CPAP every night " he depends on it". Former smoker. History of paroxysmal atrial fibrillation only during chest injury. Previous ENT surgery for tonsils.  06/04/13- 68 yo m former smoker referred courtesy of Dr. Lamonte Sakai for obstructive sleep apnea. Dr. Lamonte Sakai follows for COPD with history of chest trauma complicated by history of ulcerative colitis on immunosuppression, hypertension. FOLLOWS FOR: wears CPAP 10/every night; DME is [facility]. would like to have CPAP as auto. Pressure ok, uses nasal pillows with chin strap. Good control. He does want to try autoPAP again.  ROS-see HPI Constitutional:   No-   weight loss, night sweats, fevers, chills, fatigue, lassitude. HEENT:   No-  headaches, difficulty swallowing, tooth/dental problems, sore throat,       No-  sneezing, itching, ear ache, nasal congestion, post nasal drip,  CV:  No-   chest pain, orthopnea, PND, swelling in lower extremities, anasarca,                                  dizziness, palpitations Resp: + shortness of breath with exertion or at rest.              No-   productive cough,  No non-productive cough,  No- coughing up of blood.              No-   change in color of mucus.  No- wheezing.   Skin: No-   rash or lesions. GI:  No-   heartburn, indigestion, GU: . MS:  No-   joint pain or swelling.  . Neuro-     nothing unusual Psych:  No- change in mood or  affect. No depression or anxiety.  No memory loss.  OBJ- Physical Exam General- Alert, Oriented, Affect-appropriate, Distress- none acute. Overweight Skin- rash-none, lesions- none, excoriation- none Lymphadenopathy- none Head- atraumatic            Eyes- Gross vision intact, PERRLA, conjunctivae and secretions clear            Ears- Hearing, canals-normal            Nose- Clear, no-Septal dev, mucus, polyps, erosion, perforation             Throat- Mallampati IV , mucosa clear , drainage- none, tonsils- atrophic Neck- flexible , trachea midline, no stridor , thyroid nl, carotid no bruit Chest - symmetrical excursion , unlabored           Heart/CV- RRR , no murmur , no gallop  , no rub, nl s1 s2                           - JVD- none , edema- none, stasis changes- none, varices- none           Lung- + diminished/ clear, wheeze- none, cough- none , dullness-none, rub-  none           Chest wall-  Abd-  Br/ Gen/ Rectal- Not done, not indicated Extrem- cyanosis- none, clubbing, none, atrophy- none, strength- nl Neuro- grossly intact to observation

## 2013-06-04 NOTE — Patient Instructions (Signed)
Order- [facility] autotitrate CPAP 8-15 cwp x 7 days for pressure recommendation   Dx OSA

## 2013-06-26 NOTE — Assessment & Plan Note (Signed)
Controlled.  

## 2013-06-26 NOTE — Assessment & Plan Note (Signed)
Good reported control, but he would like to re-try Auto.  P- [facility] to autotitrate for fixed presure recommendation, but also to see if Auto is more comfortable.

## 2013-06-28 ENCOUNTER — Telehealth: Payer: Self-pay | Admitting: Internal Medicine

## 2013-06-28 NOTE — Telephone Encounter (Signed)
Spoke with the pt  He is calling for results of recent CPAP DL from [facility]  Please advise if you have reviewed these, thanks!

## 2013-06-29 NOTE — Telephone Encounter (Signed)
That download has been sent to be scanned, so it won't show in the computer for a few days till that can be done.

## 2013-06-29 NOTE — Telephone Encounter (Signed)
Pt aware. I will forward the message to Joellen Jersey to check to see when download gets scanned in. Pratt Bing, [place]

## 2013-07-06 NOTE — Telephone Encounter (Signed)
Please advise Ricardo Cooper thanks

## 2013-07-09 ENCOUNTER — Telehealth: Payer: Self-pay | Admitting: Internal Medicine

## 2013-07-09 NOTE — Telephone Encounter (Signed)
CPAP download looks good. He has an ov with me tomorrow, so I can go over it with him then.

## 2013-07-09 NOTE — Telephone Encounter (Signed)
lmtcb x1 

## 2013-07-09 NOTE — Telephone Encounter (Signed)
Please see phone note on 07-09-13.

## 2013-07-09 NOTE — Telephone Encounter (Signed)
Spoke with [facility] .  Download faxed and attached to this message.  Please advise on results for pt.

## 2013-07-09 NOTE — Telephone Encounter (Signed)
I spoke with patient about results and he verbalized understanding and had no questions 

## 2013-07-10 ENCOUNTER — Encounter: Payer: Self-pay | Admitting: Internal Medicine

## 2013-07-10 ENCOUNTER — Ambulatory Visit (INDEPENDENT_AMBULATORY_CARE_PROVIDER_SITE_OTHER): Payer: Medicare Other | Admitting: Internal Medicine

## 2013-07-10 VITALS — BP 118/64 | HR 83 | Ht 71.0 in | Wt 247.6 lb

## 2013-07-10 DIAGNOSIS — J209 Acute bronchitis, unspecified: Secondary | ICD-10-CM

## 2013-07-10 DIAGNOSIS — G4733 Obstructive sleep apnea (adult) (pediatric): Secondary | ICD-10-CM | POA: Diagnosis not present

## 2013-07-10 MED ORDER — AZITHROMYCIN 250 MG PO TABS: ORAL_TABLET | ORAL | Status: DC

## 2013-07-10 NOTE — Progress Notes (Signed)
06/02/12- 68 yo m former smoker referred courtesy of Dr. Lamonte Sakai for obstructive sleep apnea. Dr. Lamonte Sakai follows for COPD with history of chest trauma complicated by history of ulcerative colitis on immunosuppression, hypertension. Sleep study was done around 1999 at [facility]. We are trying to get that documentation. He has been using CPAP 10 with nasal pillows mask and humidifier. Good compliance and control. Bedtime between 11 PM and midnight with estimated sleep latency 10-15 minutes, waking 3-5 times during the night before up between 7 and 8 AM. He emphasizes that he uses CPAP every night " he depends on it". Former smoker. History of paroxysmal atrial fibrillation only during chest injury. Previous ENT surgery for tonsils.  06/04/13- 32 yo m former smoker referred courtesy of Dr. Lamonte Sakai for obstructive sleep apnea. Dr. Lamonte Sakai follows for COPD with history of chest trauma complicated by history of ulcerative colitis on immunosuppression, hypertension. FOLLOWS FOR: wears CPAP 10/every night; DME is [facility]. would like to have CPAP as auto. Pressure ok, uses nasal pillows with chin strap. Good control. He does want to try autoPAP again.  07/10/13- 52 yo m former smoker referred courtesy of Dr. Lamonte Sakai for obstructive sleep apnea. Dr. Lamonte Sakai follows for COPD with history of chest trauma complicated by history of ulcerative colitis on immunosuppression, hypertension. FOLLOWS FOR: Review CPAP study with patient-states he felt better on auto and was not up and down as much-slept better overall. DME is [facility]. ? bronchitis Download confirms good use/compliance and indicated a good fixed pressure would be 14. He is resolving a recent acute bronchitis with some residual yellow sputum.  ROS-see HPI Constitutional:   No-   weight loss, night sweats, fevers, chills, fatigue, lassitude. HEENT:   No-  headaches, difficulty swallowing, tooth/dental problems, sore throat,       No-  sneezing, itching, ear ache, nasal  congestion, post nasal drip,  CV:  No-   chest pain, orthopnea, PND, swelling in lower extremities, anasarca,                                  dizziness, palpitations Resp: + shortness of breath with exertion or at rest.              +productive cough,  No non-productive cough,  No- coughing up of blood.              +  change in color of mucus.  No- wheezing.   Skin: No-   rash or lesions. GI:  No-   heartburn, indigestion, GU: . MS:  No-   joint pain or swelling.  . Neuro-     nothing unusual Psych:  No- change in mood or affect. No depression or anxiety.  No memory loss.  OBJ- Physical Exam General- Alert, Oriented, Affect-appropriate, Distress- none acute. Overweight Skin- rash-none, lesions- none, excoriation- none Lymphadenopathy- none Head- atraumatic            Eyes- Gross vision intact, PERRLA, conjunctivae and secretions clear            Ears- Hearing, canals-normal            Nose- Clear, no-Septal dev, mucus, polyps, erosion, perforation             Throat- Mallampati IV , mucosa clear , drainage- none, tonsils- atrophic Neck- flexible , trachea midline, no stridor , thyroid nl, carotid no bruit Chest - symmetrical excursion , unlabored  Heart/CV- RRR , no murmur , no gallop  , no rub, nl s1 s2                           - JVD- none , edema- none, stasis changes- none, varices- none           Lung- + diminished/ clear, wheeze- none, cough+ hoarse , dullness-none, rub- none           Chest wall-  Abd-  Br/ Gen/ Rectal- Not done, not indicated Extrem- cyanosis- none, clubbing, none, atrophy- none, strength- nl Neuro- grossly intact to observation

## 2013-07-10 NOTE — Patient Instructions (Signed)
Script sent for Zpak antibiotic  Order- [facility]-- replace old CPAP machine, autopap 8-15, mask of choice, humidifier, supplies      Dx OSA

## 2013-07-12 ENCOUNTER — Telehealth: Payer: Self-pay | Admitting: Internal Medicine

## 2013-07-12 NOTE — Telephone Encounter (Signed)
The order has been refaxed to correct fax #.

## 2013-07-12 NOTE — Telephone Encounter (Signed)
Called and spoke with Kaufman at [facility]. She states that they have not received an order for new CPAP. This will be faxed to [phone] per her request.  Advised pt of that the order has to be refaxed to [facility]. He will let us know if he doesn't hear from them.

## 2013-07-27 ENCOUNTER — Other Ambulatory Visit: Payer: Self-pay | Admitting: Emergency Medicine

## 2013-07-29 NOTE — Assessment & Plan Note (Signed)
He would like to stay with auto Pap so we are setting pressure range 8-15

## 2013-07-29 NOTE — Assessment & Plan Note (Signed)
Acute bronchitic exacerbation of COPD Plan-Z-Pak, fluids, supportive care

## 2013-08-28 ENCOUNTER — Other Ambulatory Visit: Payer: Self-pay | Admitting: Internal Medicine

## 2013-09-03 ENCOUNTER — Encounter (HOSPITAL_COMMUNITY): Payer: Self-pay

## 2013-09-04 ENCOUNTER — Other Ambulatory Visit: Payer: Self-pay | Admitting: Internal Medicine

## 2013-09-05 ENCOUNTER — Encounter (HOSPITAL_COMMUNITY): Payer: Self-pay

## 2013-09-19 ENCOUNTER — Encounter (HOSPITAL_COMMUNITY): Payer: Self-pay

## 2013-09-19 ENCOUNTER — Ambulatory Visit (HOSPITAL_COMMUNITY)
Admission: RE | Admit: 2013-09-19 | Discharge: 2013-09-19 | Disposition: A | Discharge status: Home / Self Care | Payer: Medicare Other | Source: Ambulatory Visit | Attending: Internal Medicine | Admitting: Internal Medicine

## 2013-09-19 VITALS — BP 126/62 | HR 78 | Wt 244.8 lb

## 2013-09-19 DIAGNOSIS — I10 Essential (primary) hypertension: Secondary | ICD-10-CM | POA: Diagnosis not present

## 2013-09-19 DIAGNOSIS — Z7982 Long term (current) use of aspirin: Secondary | ICD-10-CM | POA: Insufficient documentation

## 2013-09-19 DIAGNOSIS — I4891 Unspecified atrial fibrillation: Secondary | ICD-10-CM | POA: Diagnosis not present

## 2013-09-19 DIAGNOSIS — E669 Obesity, unspecified: Secondary | ICD-10-CM | POA: Insufficient documentation

## 2013-09-19 DIAGNOSIS — K5190 Ulcerative colitis, unspecified, without complications: Secondary | ICD-10-CM | POA: Insufficient documentation

## 2013-09-19 DIAGNOSIS — G4733 Obstructive sleep apnea (adult) (pediatric): Secondary | ICD-10-CM | POA: Diagnosis not present

## 2013-09-19 DIAGNOSIS — K219 Gastro-esophageal reflux disease without esophagitis: Secondary | ICD-10-CM | POA: Insufficient documentation

## 2013-09-19 DIAGNOSIS — J449 Chronic obstructive pulmonary disease, unspecified: Secondary | ICD-10-CM | POA: Diagnosis not present

## 2013-09-19 DIAGNOSIS — E785 Hyperlipidemia, unspecified: Secondary | ICD-10-CM

## 2013-09-19 DIAGNOSIS — R0602 Shortness of breath: Secondary | ICD-10-CM | POA: Diagnosis not present

## 2013-09-19 DIAGNOSIS — J309 Allergic rhinitis, unspecified: Secondary | ICD-10-CM | POA: Diagnosis not present

## 2013-09-19 DIAGNOSIS — J4489 Other specified chronic obstructive pulmonary disease: Secondary | ICD-10-CM | POA: Insufficient documentation
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
## 2013-09-19 NOTE — Patient Instructions (Signed)
Your physician has requested that you have cardiac CT. Cardiac computed tomography (CT) is a painless test that uses an x-ray machine to take clear, detailed pictures of your heart. For further information please visit [email or web address]. Please follow instruction sheet as given. CALCIUM SCORING               09/24/13 @ 1:30PM             [phone]  Your physician recommends that you schedule a follow-up appointment in: 56 YEARS

## 2013-09-20 NOTE — Addendum Note (Signed)
Encounter addended by: Asencion Gowda, CCT on: 09/20/2013  9:48 AM<BR>     Documentation filed: Charges VN

## 2013-09-24 ENCOUNTER — Inpatient Hospital Stay: Admission: RE | Admit: 2013-09-24 | Payer: Medicare Other | Source: Ambulatory Visit

## 2013-09-25 ENCOUNTER — Ambulatory Visit (INDEPENDENT_AMBULATORY_CARE_PROVIDER_SITE_OTHER)
Admission: RE | Admit: 2013-09-25 | Discharge: 2013-09-25 | Disposition: A | Discharge status: Home / Self Care | Payer: Self-pay | Source: Ambulatory Visit | Attending: Internal Medicine | Admitting: Internal Medicine

## 2013-09-25 DIAGNOSIS — I4891 Unspecified atrial fibrillation: Secondary | ICD-10-CM

## 2013-09-26 DIAGNOSIS — Z85828 Personal history of other malignant neoplasm of skin: Secondary | ICD-10-CM | POA: Diagnosis not present

## 2013-09-26 DIAGNOSIS — D692 Other nonthrombocytopenic purpura: Secondary | ICD-10-CM | POA: Diagnosis not present

## 2013-09-26 DIAGNOSIS — L570 Actinic keratosis: Secondary | ICD-10-CM | POA: Diagnosis not present

## 2013-09-27 ENCOUNTER — Other Ambulatory Visit (HOSPITAL_COMMUNITY): Payer: Self-pay | Admitting: Internal Medicine

## 2013-10-01 DIAGNOSIS — H52209 Unspecified astigmatism, unspecified eye: Secondary | ICD-10-CM | POA: Diagnosis not present

## 2013-10-01 DIAGNOSIS — H2510 Age-related nuclear cataract, unspecified eye: Secondary | ICD-10-CM | POA: Diagnosis not present

## 2013-10-08 ENCOUNTER — Other Ambulatory Visit: Payer: Self-pay | Admitting: Internal Medicine

## 2013-11-08 ENCOUNTER — Other Ambulatory Visit: Payer: Self-pay | Admitting: Internal Medicine

## 2013-11-21 ENCOUNTER — Other Ambulatory Visit: Payer: Self-pay | Admitting: Internal Medicine

## 2013-12-03 DIAGNOSIS — Z79899 Other long term (current) drug therapy: Secondary | ICD-10-CM | POA: Diagnosis not present

## 2013-12-03 DIAGNOSIS — Z125 Encounter for screening for malignant neoplasm of prostate: Secondary | ICD-10-CM | POA: Diagnosis not present

## 2013-12-03 DIAGNOSIS — R7301 Impaired fasting glucose: Secondary | ICD-10-CM | POA: Diagnosis not present

## 2013-12-03 DIAGNOSIS — E785 Hyperlipidemia, unspecified: Secondary | ICD-10-CM | POA: Diagnosis not present

## 2013-12-03 DIAGNOSIS — I10 Essential (primary) hypertension: Secondary | ICD-10-CM | POA: Diagnosis not present

## 2013-12-06 ENCOUNTER — Other Ambulatory Visit: Payer: Self-pay | Admitting: Internal Medicine

## 2013-12-10 DIAGNOSIS — Z23 Encounter for immunization: Secondary | ICD-10-CM | POA: Diagnosis not present

## 2013-12-10 DIAGNOSIS — I10 Essential (primary) hypertension: Secondary | ICD-10-CM | POA: Diagnosis not present

## 2013-12-10 DIAGNOSIS — J449 Chronic obstructive pulmonary disease, unspecified: Secondary | ICD-10-CM | POA: Diagnosis not present

## 2013-12-10 DIAGNOSIS — Z0000 Encounter for general adult medical examination without abnormal findings: Secondary | ICD-10-CM | POA: Diagnosis not present

## 2013-12-10 DIAGNOSIS — Z1331 Encounter for screening for depression: Secondary | ICD-10-CM | POA: Diagnosis not present

## 2013-12-10 DIAGNOSIS — E8881 Metabolic syndrome: Secondary | ICD-10-CM | POA: Diagnosis not present

## 2013-12-10 DIAGNOSIS — E785 Hyperlipidemia, unspecified: Secondary | ICD-10-CM | POA: Diagnosis not present

## 2013-12-10 DIAGNOSIS — Z87891 Personal history of nicotine dependence: Secondary | ICD-10-CM | POA: Diagnosis not present

## 2013-12-10 DIAGNOSIS — R7301 Impaired fasting glucose: Secondary | ICD-10-CM | POA: Diagnosis not present

## 2013-12-10 DIAGNOSIS — E291 Testicular hypofunction: Secondary | ICD-10-CM | POA: Diagnosis not present

## 2013-12-26 ENCOUNTER — Other Ambulatory Visit: Payer: Self-pay | Admitting: Dermatology

## 2013-12-26 DIAGNOSIS — L820 Inflamed seborrheic keratosis: Secondary | ICD-10-CM | POA: Diagnosis not present

## 2013-12-26 DIAGNOSIS — D485 Neoplasm of uncertain behavior of skin: Secondary | ICD-10-CM | POA: Diagnosis not present

## 2013-12-26 DIAGNOSIS — L908 Other atrophic disorders of skin: Secondary | ICD-10-CM | POA: Diagnosis not present

## 2013-12-26 DIAGNOSIS — L570 Actinic keratosis: Secondary | ICD-10-CM | POA: Diagnosis not present

## 2013-12-26 DIAGNOSIS — L821 Other seborrheic keratosis: Secondary | ICD-10-CM | POA: Diagnosis not present

## 2013-12-26 DIAGNOSIS — Z85828 Personal history of other malignant neoplasm of skin: Secondary | ICD-10-CM | POA: Diagnosis not present

## 2013-12-26 DIAGNOSIS — D692 Other nonthrombocytopenic purpura: Secondary | ICD-10-CM | POA: Diagnosis not present

## 2013-12-26 DIAGNOSIS — L918 Other hypertrophic disorders of the skin: Secondary | ICD-10-CM | POA: Diagnosis not present

## 2013-12-26 DIAGNOSIS — D239 Other benign neoplasm of skin, unspecified: Secondary | ICD-10-CM | POA: Diagnosis not present

## 2013-12-26 DIAGNOSIS — C4441 Basal cell carcinoma of skin of scalp and neck: Secondary | ICD-10-CM | POA: Diagnosis not present

## 2013-12-28 ENCOUNTER — Telehealth: Payer: Self-pay | Admitting: Internal Medicine

## 2013-12-28 MED ORDER — MERCAPTOPURINE 50 MG PO TABS: ORAL_TABLET | ORAL | Status: DC

## 2013-12-28 NOTE — Telephone Encounter (Signed)
Sent one refill to patient's appt that is scheduled. Pt notified to keep appt for any further refills.

## 2014-01-04 ENCOUNTER — Other Ambulatory Visit: Payer: Self-pay | Admitting: Internal Medicine

## 2014-01-11 ENCOUNTER — Ambulatory Visit: Payer: Medicare Other | Admitting: Internal Medicine

## 2014-01-14 DIAGNOSIS — Z85828 Personal history of other malignant neoplasm of skin: Secondary | ICD-10-CM | POA: Diagnosis not present

## 2014-01-14 DIAGNOSIS — C44519 Basal cell carcinoma of skin of other part of trunk: Secondary | ICD-10-CM | POA: Diagnosis not present

## 2014-01-14 DIAGNOSIS — L821 Other seborrheic keratosis: Secondary | ICD-10-CM | POA: Diagnosis not present

## 2014-01-22 ENCOUNTER — Other Ambulatory Visit: Payer: Self-pay | Admitting: Internal Medicine

## 2014-01-23 DIAGNOSIS — Z23 Encounter for immunization: Secondary | ICD-10-CM | POA: Diagnosis not present

## 2014-02-10 ENCOUNTER — Other Ambulatory Visit: Payer: Self-pay | Admitting: Internal Medicine

## 2014-02-12 ENCOUNTER — Ambulatory Visit (INDEPENDENT_AMBULATORY_CARE_PROVIDER_SITE_OTHER): Payer: Medicare Other | Admitting: Internal Medicine

## 2014-02-12 ENCOUNTER — Encounter: Payer: Self-pay | Admitting: Internal Medicine

## 2014-02-12 ENCOUNTER — Other Ambulatory Visit (INDEPENDENT_AMBULATORY_CARE_PROVIDER_SITE_OTHER): Payer: Medicare Other

## 2014-02-12 VITALS — BP 128/76 | HR 76 | Ht 71.0 in | Wt 251.2 lb

## 2014-02-12 DIAGNOSIS — K5150 Left sided colitis without complications: Secondary | ICD-10-CM | POA: Diagnosis not present

## 2014-02-12 DIAGNOSIS — K5190 Ulcerative colitis, unspecified, without complications: Secondary | ICD-10-CM | POA: Diagnosis not present

## 2014-02-12 DIAGNOSIS — Z79899 Other long term (current) drug therapy: Secondary | ICD-10-CM | POA: Diagnosis not present

## 2014-02-12 DIAGNOSIS — Z86010 Personal history of colonic polyps: Secondary | ICD-10-CM | POA: Diagnosis not present

## 2014-02-12 LAB — CBC WITH DIFFERENTIAL/PLATELET
Basophils Absolute: 0.1 10*3/uL (ref 0.0–0.1)
Basophils Relative: 1.4 % (ref 0.0–3.0)
Eosinophils Absolute: 0.2 10*3/uL (ref 0.0–0.7)
Eosinophils Relative: 4.8 % (ref 0.0–5.0)
HCT: 45.6 % (ref 39.0–52.0)
Hemoglobin: 15.1 g/dL (ref 13.0–17.0)
LYMPHS PCT: 10.8 % — AB (ref 12.0–46.0)
Lymphs Abs: 0.5 10*3/uL — ABNORMAL LOW (ref 0.7–4.0)
MCHC: 33.2 g/dL (ref 30.0–36.0)
MCV: 97.5 fl (ref 78.0–100.0)
Monocytes Absolute: 0.8 10*3/uL (ref 0.1–1.0)
Monocytes Relative: 16.3 % — ABNORMAL HIGH (ref 3.0–12.0)
NEUTROS PCT: 66.7 % (ref 43.0–77.0)
Neutro Abs: 3.4 10*3/uL (ref 1.4–7.7)
PLATELETS: 194 10*3/uL (ref 150.0–400.0)
RBC: 4.68 Mil/uL (ref 4.22–5.81)
RDW: 14 % (ref 11.5–15.5)
WBC: 5.1 10*3/uL (ref 4.0–10.5)

## 2014-02-12 LAB — COMPREHENSIVE METABOLIC PANEL
ALT: 30 U/L (ref 0–53)
AST: 27 U/L (ref 0–37)
Albumin: 3.6 g/dL (ref 3.5–5.2)
Alkaline Phosphatase: 39 U/L (ref 39–117)
BUN: 20 mg/dL (ref 6–23)
CO2: 31 meq/L (ref 19–32)
CREATININE: 1.2 mg/dL (ref 0.4–1.5)
Calcium: 9.4 mg/dL (ref 8.4–10.5)
Chloride: 103 mEq/L (ref 96–112)
GFR: 63.6 mL/min (ref >60.00)
Glucose, Bld: 107 mg/dL — ABNORMAL HIGH (ref 70–99)
Potassium: 4 mEq/L (ref 3.5–5.1)
SODIUM: 140 meq/L (ref 135–145)
TOTAL PROTEIN: 7.3 g/dL (ref 6.0–8.3)
Total Bilirubin: 0.8 mg/dL (ref 0.2–1.2)

## 2014-02-12 MED ORDER — MERCAPTOPURINE 50 MG PO TABS: 50.0000 mg | ORAL_TABLET | Freq: Every day | ORAL | Status: DC

## 2014-02-12 MED ORDER — SULFASALAZINE 500 MG PO TBEC: DELAYED_RELEASE_TABLET | ORAL | Status: DC

## 2014-02-12 NOTE — Progress Notes (Signed)
Subjective:    Patient ID: Ricardo Cooper, male    DOB: [birth year], 68 y.o.   MRN: [id number]  HPI Ricardo Cooper is a 68 year old male with a past medical history of left-sided ulcerative colitis diagnosed in 2008, COPD, OSA, hypertension, GERD, history of basal cell carcinoma, and history of adenomatous colon polyps who is seen in followup. He was last seen in March 2014 but had surveillance colonoscopy in October 2014. Colonoscopy revealed mild ulcerative colitis in the distal sigmoid and rectum extending to 25 cm from the dentate line. 8 sessile and pedunculated polyps are found in the distal sigmoid along with 7 in the rectum. These were removed using snare cautery. He also had a 5 mm polyp in the ascending colon was was removed with cold snare. Moderate diverticulosis was seen in the left colon. Pathology results revealed no adenomatous or dysplastic changes. All polyps were inflammatory pseudopolyps with fragments of granulation tissue.  He has remained on sulfasalazine 1 g 3 times daily and mercaptopurine 100 mg daily. He reports he has had clinical remission. He was having some mild mucus in very rare bleeding but this all resolved after last colonoscopy. He denies abdominal pain. Bowel movements are 1-2 times a day and formed. No blood or melena. No fevers or chills. No rashes. No oral ulcers. No eye complaints. No hepatobiliary complaints. GERD is well-controlled with diet. No dysphagia or odynophagia. He is very happy with his current clinical remission.   Review of Systems As per history of present illness, otherwise negative  Current Medications, Allergies, Past Medical History, Past Surgical History, Family History and Social History were reviewed in [employer] record.     Objective:   Physical Exam BP 128/76  Cooper 76  Ht 5' 11"  (1.803 m)  Wt 251 lb 3.2 oz (113.944 kg)  BMI 35.05 kg/m2 Constitutional: Well-developed and well-nourished. No  distress. HEENT: Normocephalic and atraumatic. Oropharynx is clear and moist. No oropharyngeal exudate. Conjunctivae are normal.  No scleral icterus. Neck: Neck supple. Trachea midline. Cardiovascular: Normal rate, regular rhythm and intact distal pulses. No M/R/G Pulmonary/chest: Effort normal and breath sounds normal. No wheezing, rales or rhonchi. Abdominal: Soft, nontender, nondistended. Bowel sounds active throughout. There are no masses palpable. No hepatosplenomegaly. Extremities: no clubbing, cyanosis, or edema Lymphadenopathy: No cervical adenopathy noted. Neurological: Alert and oriented to person place and time. Skin: Skin is warm and dry. No rashes noted. Psychiatric: Normal mood and affect. Behavior is normal.  He reports labs performed by PCP, Dr. Brigitte Cooper, in July 2015 and reportedly normal.     Assessment & Plan:  68 year old male with a past medical history of left-sided ulcerative colitis diagnosed in 2008, COPD, OSA, hypertension, GERD, history of basal cell carcinoma, and history of adenomatous colon polyps who is seen in followup.   1. Left-sided ulcerative colitis -- fortunately he has achieved clinical remission though one year ago he had mild active colitis in the distal sigmoid and rectum. He had multiple large polyps, fortunately all of which were pseudopolyps and inflammatory in nature. There was no adenomatous tissue. These large polyps were removed with snare cautery and since this time he has had less mucus and resolution of blood in his stool. He continues to mercaptopurine 100 mg daily and sulfasalazine 1 g 3 times a day and we will continue these doses at this time. Given his clinical remission we will not escalate therapy, though if this was necessary my recommendation will be vedolizumab, as he has  previous nonresponse to infliximab and adalimumab. Labs today to include CBC, CMP and thiopurine metabolite panel. --Repeat colonoscopy for surveillance recommended  October 2016 --CBC and CMP recommended every 3 months while on 6-MP --He has already had flu shot and Pneumovax this year  Return in one year, sooner if any evidence of recurrent colitis.

## 2014-02-12 NOTE — Patient Instructions (Signed)
We have sent the following medications to your pharmacy for you to pick up at your convenience: 6 MP, sulfasalazine  Your physician has requested that you go to the basement for lab work before leaving today   Please follow up with Dr. Hilarie Fredrickson in one year.

## 2014-02-18 LAB — THIOPURINE METABOLITES
6 TGN METABOLITE: 146 pmol/8x 10E8
6-MMPN Metaboilte: 5165 pmol/8x 10E8

## 2014-02-19 ENCOUNTER — Other Ambulatory Visit: Payer: Self-pay | Admitting: Internal Medicine

## 2014-02-19 ENCOUNTER — Telehealth: Payer: Self-pay | Admitting: Internal Medicine

## 2014-02-19 NOTE — Telephone Encounter (Signed)
I have spoken to pharmacist at [facility]. Per Dr Vena Rua last office note, patient is to be on 66mp 100 mg daily. It appears that rx was sent for one tablet daily in error. I have given a verbal for 61mp 50 mg tablets, take 2 tablets by mouth once daily. I will also send a new electronic script so this is reflected in our system.

## 2014-03-25 ENCOUNTER — Other Ambulatory Visit: Payer: Self-pay | Admitting: Internal Medicine

## 2014-03-25 DIAGNOSIS — R911 Solitary pulmonary nodule: Secondary | ICD-10-CM

## 2014-03-26 ENCOUNTER — Other Ambulatory Visit: Payer: Self-pay | Admitting: Emergency Medicine

## 2014-03-27 ENCOUNTER — Ambulatory Visit
Admission: RE | Admit: 2014-03-27 | Discharge: 2014-03-27 | Disposition: A | Discharge status: Home / Self Care | Payer: Medicare Other | Source: Ambulatory Visit | Attending: Internal Medicine | Admitting: Internal Medicine

## 2014-03-27 DIAGNOSIS — R0602 Shortness of breath: Secondary | ICD-10-CM | POA: Diagnosis not present

## 2014-03-27 DIAGNOSIS — R911 Solitary pulmonary nodule: Secondary | ICD-10-CM

## 2014-04-16 DIAGNOSIS — M1711 Unilateral primary osteoarthritis, right knee: Secondary | ICD-10-CM | POA: Diagnosis not present

## 2014-04-16 DIAGNOSIS — M1712 Unilateral primary osteoarthritis, left knee: Secondary | ICD-10-CM | POA: Diagnosis not present

## 2014-04-23 NOTE — Telephone Encounter (Signed)
Done

## 2014-05-16 ENCOUNTER — Other Ambulatory Visit: Payer: Self-pay | Admitting: Dermatology

## 2014-05-16 DIAGNOSIS — D485 Neoplasm of uncertain behavior of skin: Secondary | ICD-10-CM | POA: Diagnosis not present

## 2014-05-16 DIAGNOSIS — D1801 Hemangioma of skin and subcutaneous tissue: Secondary | ICD-10-CM | POA: Diagnosis not present

## 2014-05-16 DIAGNOSIS — L821 Other seborrheic keratosis: Secondary | ICD-10-CM | POA: Diagnosis not present

## 2014-05-16 DIAGNOSIS — Z85828 Personal history of other malignant neoplasm of skin: Secondary | ICD-10-CM | POA: Diagnosis not present

## 2014-05-16 DIAGNOSIS — L820 Inflamed seborrheic keratosis: Secondary | ICD-10-CM | POA: Diagnosis not present

## 2014-05-16 DIAGNOSIS — L570 Actinic keratosis: Secondary | ICD-10-CM | POA: Diagnosis not present

## 2014-05-16 DIAGNOSIS — D225 Melanocytic nevi of trunk: Secondary | ICD-10-CM | POA: Diagnosis not present

## 2014-05-16 DIAGNOSIS — L922 Granuloma faciale [eosinophilic granuloma of skin]: Secondary | ICD-10-CM | POA: Diagnosis not present

## 2014-05-16 DIAGNOSIS — D692 Other nonthrombocytopenic purpura: Secondary | ICD-10-CM | POA: Diagnosis not present

## 2014-05-16 DIAGNOSIS — L918 Other hypertrophic disorders of the skin: Secondary | ICD-10-CM | POA: Diagnosis not present

## 2014-06-04 ENCOUNTER — Encounter: Payer: Self-pay | Admitting: Internal Medicine

## 2014-06-04 ENCOUNTER — Ambulatory Visit (INDEPENDENT_AMBULATORY_CARE_PROVIDER_SITE_OTHER): Payer: Medicare Other | Admitting: Internal Medicine

## 2014-06-04 VITALS — BP 124/70 | HR 83 | Ht 71.0 in | Wt 247.4 lb

## 2014-06-04 DIAGNOSIS — G4733 Obstructive sleep apnea (adult) (pediatric): Secondary | ICD-10-CM

## 2014-06-04 NOTE — Patient Instructions (Signed)
We can continue CPAP auto 8-15/ [facility]  Please request download for pressure compliance documentation

## 2014-06-04 NOTE — Assessment & Plan Note (Addendum)
Compliance and control seem to be quite good. Comfort and compliance issues discussed. Occasional insomnia controlled with Benadryl Plan-download for documentation

## 2014-06-04 NOTE — Progress Notes (Signed)
06/02/12- 69 yo m former smoker referred courtesy of Dr. Lamonte Sakai for obstructive sleep apnea. Dr. Lamonte Sakai follows for COPD with history of chest trauma complicated by history of ulcerative colitis on immunosuppression, hypertension. Sleep study was done around 1999 at [facility]. We are trying to get that documentation. He has been using CPAP 10 with nasal pillows mask and humidifier. Good compliance and control. Bedtime between 11 PM and midnight with estimated sleep latency 10-15 minutes, waking 3-5 times during the night before up between 7 and 8 AM. He emphasizes that he uses CPAP every night " he depends on it". Former smoker. History of paroxysmal atrial fibrillation only during chest injury. Previous ENT surgery for tonsils.  06/04/13- 66 yo m former smoker referred courtesy of Dr. Lamonte Sakai for obstructive sleep apnea. Dr. Lamonte Sakai follows for COPD with history of chest trauma complicated by history of ulcerative colitis on immunosuppression, hypertension. FOLLOWS FOR: wears CPAP 10/every night; DME is [facility]. would like to have CPAP as auto. Pressure ok, uses nasal pillows with chin strap. Good control. He does want to try autoPAP again.  07/10/13- 44 yo m former smoker referred courtesy of Dr. Lamonte Sakai for obstructive sleep apnea. Dr. Lamonte Sakai follows for COPD with history of chest trauma complicated by history of ulcerative colitis on immunosuppression, hypertension. FOLLOWS FOR: Review CPAP study with patient-states he felt better on auto and was not up and down as much-slept better overall. DME is [facility]. ? bronchitis Download confirms good use/compliance and indicated a good fixed pressure would be 14. He is resolving a recent acute bronchitis with some residual yellow sputum.  06/04/14-68 yo m former smoker referred courtesy of Dr. Lamonte Sakai for obstructive sleep apnea. Dr. Lamonte Sakai follows for COPD with history of chest trauma complicated by history of ulcerative colitis on immunosuppression, hypertension,  PAFib. FOLLOWS FOR: Wears CPAP auto 8-15/ [facility] every night for about 7-8 hours; DME is [facility] Still frequent waking after sleep onset and occasionally pull CPAP mask off. Benadryl helps sleep quality. We discussed insomnia  ROS-see HPI Constitutional:   No-   weight loss, night sweats, fevers, chills, fatigue, lassitude. HEENT:   No-  headaches, difficulty swallowing, tooth/dental problems, sore throat,       No-  sneezing, itching, ear ache, nasal congestion, post nasal drip,  CV:  No-   chest pain, orthopnea, PND, swelling in lower extremities, anasarca,                                  dizziness, palpitations Resp: + shortness of breath with exertion or at rest.              +productive cough,  No non-productive cough,  No- coughing up of blood.              +  change in color of mucus.  No- wheezing.   Skin: No-   rash or lesions. GI:  No-   heartburn, indigestion, GU: . MS:  No-   joint pain or swelling.  . Neuro-     nothing unusual Psych:  No- change in mood or affect. No depression or anxiety.  No memory loss.  OBJ- Physical Exam General- Alert, Oriented, Affect-appropriate, Distress- none acute. Overweight Skin- rash-none, lesions- none, excoriation- none Lymphadenopathy- none Head- atraumatic            Eyes- Gross vision intact, PERRLA, conjunctivae and secretions clear  Ears- Hearing, canals-normal            Nose- Clear, no-Septal dev, mucus, polyps, erosion, perforation             Throat- Mallampati IV , mucosa clear , drainage- none, tonsils- atrophic Neck- flexible , trachea midline, no stridor , thyroid nl, carotid no bruit Chest - symmetrical excursion , unlabored           Heart/CV- RRR , no murmur , no gallop  , no rub, nl s1 s2                           - JVD- none , edema- none, stasis changes- none, varices- none           Lung- + diminished/ clear, wheeze- none, cough+ hoarse , dullness-none, rub- none           Chest wall-  Abd-  Br/ Gen/  Rectal- Not done, not indicated Extrem- cyanosis- none, clubbing, none, atrophy- none, strength- nl Neuro- grossly intact to observation

## 2014-06-22 ENCOUNTER — Other Ambulatory Visit: Payer: Self-pay | Admitting: Internal Medicine

## 2014-07-04 ENCOUNTER — Other Ambulatory Visit: Payer: Self-pay | Admitting: Internal Medicine

## 2014-07-08 ENCOUNTER — Emergency Department (HOSPITAL_COMMUNITY)
Admission: EM | Admit: 2014-07-08 | Discharge: 2014-07-08 | Disposition: A | Discharge status: Home / Self Care | Payer: Worker's Compensation | Attending: Emergency Medicine | Admitting: Emergency Medicine

## 2014-07-08 ENCOUNTER — Encounter (HOSPITAL_COMMUNITY): Payer: Self-pay | Admitting: Emergency Medicine

## 2014-07-08 DIAGNOSIS — I10 Essential (primary) hypertension: Secondary | ICD-10-CM | POA: Diagnosis not present

## 2014-07-08 DIAGNOSIS — E785 Hyperlipidemia, unspecified: Secondary | ICD-10-CM | POA: Insufficient documentation

## 2014-07-08 DIAGNOSIS — Z7982 Long term (current) use of aspirin: Secondary | ICD-10-CM | POA: Insufficient documentation

## 2014-07-08 DIAGNOSIS — Z8719 Personal history of other diseases of the digestive system: Secondary | ICD-10-CM | POA: Insufficient documentation

## 2014-07-08 DIAGNOSIS — Z9981 Dependence on supplemental oxygen: Secondary | ICD-10-CM | POA: Insufficient documentation

## 2014-07-08 DIAGNOSIS — S81812A Laceration without foreign body, left lower leg, initial encounter: Secondary | ICD-10-CM | POA: Diagnosis not present

## 2014-07-08 DIAGNOSIS — Y9389 Activity, other specified: Secondary | ICD-10-CM | POA: Diagnosis not present

## 2014-07-08 DIAGNOSIS — Y9289 Other specified places as the place of occurrence of the external cause: Secondary | ICD-10-CM | POA: Diagnosis not present

## 2014-07-08 DIAGNOSIS — Y998 Other external cause status: Secondary | ICD-10-CM | POA: Diagnosis not present

## 2014-07-08 DIAGNOSIS — J449 Chronic obstructive pulmonary disease, unspecified: Secondary | ICD-10-CM | POA: Diagnosis not present

## 2014-07-08 DIAGNOSIS — Z85828 Personal history of other malignant neoplasm of skin: Secondary | ICD-10-CM | POA: Insufficient documentation

## 2014-07-08 DIAGNOSIS — Z87891 Personal history of nicotine dependence: Secondary | ICD-10-CM | POA: Insufficient documentation

## 2014-07-08 DIAGNOSIS — W208XXA Other cause of strike by thrown, projected or falling object, initial encounter: Secondary | ICD-10-CM | POA: Insufficient documentation

## 2014-07-08 DIAGNOSIS — G4733 Obstructive sleep apnea (adult) (pediatric): Secondary | ICD-10-CM | POA: Diagnosis not present

## 2014-07-08 DIAGNOSIS — M79605 Pain in left leg: Secondary | ICD-10-CM | POA: Diagnosis not present

## 2014-07-08 DIAGNOSIS — I480 Paroxysmal atrial fibrillation: Secondary | ICD-10-CM | POA: Insufficient documentation

## 2014-07-08 DIAGNOSIS — Z79899 Other long term (current) drug therapy: Secondary | ICD-10-CM | POA: Diagnosis not present

## 2014-07-08 MED ORDER — HYDROCODONE-ACETAMINOPHEN 5-325 MG PO TABS: 1.0000 | ORAL_TABLET | Freq: Four times a day (QID) | ORAL | Status: DC | PRN

## 2014-07-08 MED ORDER — LIDOCAINE-EPINEPHRINE (PF) 2 %-1:200000 IJ SOLN
10.0000 mL | Freq: Once | INTRAMUSCULAR | Status: AC
  Administered 2014-07-08: 10 mL
  Filled 2014-07-08: qty 20

## 2014-07-08 MED ORDER — CEPHALEXIN 500 MG PO CAPS: 500.0000 mg | ORAL_CAPSULE | Freq: Four times a day (QID) | ORAL | Status: DC

## 2014-07-08 NOTE — ED Notes (Signed)
Pt was setting up a voting machine, and part of it fell out onto his leg and scraped the left lower leg above the ankle. BP 140/80, HR 88, GCS 15. Pt did not fall.

## 2014-07-08 NOTE — ED Provider Notes (Signed)
CSN: [id number]     Arrival date & time 07/08/14  11 History   First MD Initiated Contact with Patient 07/08/14 1411     Chief Complaint  Patient presents with  . Extremity Laceration     (Consider location/radiation/quality/duration/timing/severity/associated sxs/prior Treatment) HPI Pt is a 69yo male presenting to ED with left lower leg laceration, bleeding controlled PTA with pressure.  Pt states he was setting up a voting machine when a part fell out onto his left lower leg just above his ankle. Pt reports immediate bleeding with a "pool of blood" pt reports taking 81mg  aspirin daily but no other blood thinners.  Pain is 1/10, aching and stinging at times.  No pain medication PTA.  No other injuries. Denies falling or passing out.  Last tetanus was 1-2 years ago per wife.    Past Medical History  Diagnosis Date  . Allergic rhinitis   . Paroxysmal atrial fibrillation 2005    past trauma and infection  . Hyperlipidemia   . HTN (hypertension)   . Obstructive sleep apnea on CPAP   . Ulcerative colitis   . COPD (chronic obstructive pulmonary disease)   . Ulcerative colitis, left sided   . Diverticulosis   . Skin cancer     basel cell on nose,arm,leg  . GERD (gastroesophageal reflux disease)   . Obesity   . Sleep apnea    Past Surgical History  Procedure Laterality Date  . Back surgery  2005  . Colon polyps    . Fetal surgery for congenital hernia    . Tonsillectomy    . Infection in chest that had to be cleaned out      Staff Infection  . Colonoscopy  2013    Dr. Hilarie Fredrickson   . Umbilical hernia repair    . Skin cancer excision    . Belpharoptosis repair      bilateral  . Colon polyps  2013  . Tubular adenoma  2013   Family History  Problem Relation Age of Onset  . Emphysema Father   . Heart disease Father   . Emphysema Maternal Grandfather   . Heart disease Maternal Grandfather   . Stroke Maternal Grandfather   . Allergies Mother   . Asthma Sister   . Colon  cancer Neg Hx   . Esophageal cancer Neg Hx   . Stomach cancer Neg Hx   . Rectal cancer Neg Hx    History  Substance Use Topics  . Smoking status: Former Smoker -- 1.00 packs/day for 35 years    Types: Cigarettes    Quit date: 04/26/2001  . Smokeless tobacco: Former Systems developer    Types: [place] date: 06/25/1991     Comment: quit in 2005  . Alcohol Use: 1.8 - 2.4 oz/week    3-4 Cans of beer per week    Review of Systems  Musculoskeletal: Positive for myalgias ( anterior left leg). Negative for arthralgias.  Skin: Positive for wound ( laceration anterior Left lower leg).  Neurological: Negative for dizziness, syncope, weakness, light-headedness, numbness and headaches.  All other systems reviewed and are negative.     Allergies  Ibuprofen  Home Medications   Prior to Admission medications   Medication Sig Start Date End Date Taking? Authorizing Provider  acetaminophen (TYLENOL) 500 MG tablet Take 500 mg by mouth every 6 (six) hours as needed for mild pain.   Yes Historical Provider, MD  albuterol (PROVENTIL HFA) 108 (90 BASE) MCG/ACT inhaler Inhale 2  puffs into the lungs every 6 (six) hours as needed.     Yes Historical Provider, MD  aspirin 81 MG tablet Take 81 mg by mouth daily.     Yes Historical Provider, MD  Cholecalciferol 2000 UNITS CAPS Take 1 capsule by mouth daily.   Yes Historical Provider, MD  Coenzyme Q10 200 MG capsule Take 200 mg by mouth daily.   Yes Historical Provider, MD  diltiazem (CARDIZEM CD) 180 MG 24 hr capsule TAKE ONE CAPSULE EACH DAY 10/08/13  Yes Jolaine Artist, MD  diphenhydrAMINE (BENADRYL) 25 mg capsule Take 25 mg by mouth. Take 2 capsules at bedtime   Yes Historical Provider, MD  fish oil-omega-3 fatty acids 1000 MG capsule Take 2 g by mouth daily.     Yes Historical Provider, MD  glucosamine-chondroitin 500-400 MG tablet Take 1 tablet by mouth 2 (two) times daily.   Yes Historical Provider, MD  losartan-hydrochlorothiazide (HYZAAR) 50-12.5 MG  per tablet TAKE ONE TABLET EACH DAY   Yes Jolaine Artist, MD  mercaptopurine (PURINETHOL) 50 MG tablet TAKE TWO TABLETS EVERY DAY 06/24/14  Yes Jerene Bears, MD  pravastatin (PRAVACHOL) 10 MG tablet Take 10 mg by mouth daily.   Yes Historical Provider, MD  sulfaSALAzine (AZULFIDINE) 500 MG EC tablet TAKE 2 TABLETS IN THE MORNING AND TAKE 2TABLETS AT LUNCH AND TAKE 2 TABLETS AT [facility] 07/04/14  Yes Jerene Bears, MD  SYMBICORT 160-4.5 MCG/ACT inhaler INHALE 2 PUFFS BY MOUTH TWICE DAILY 03/26/14  Yes Deneise Lever, MD  vitamin B-12 (CYANOCOBALAMIN) 250 MCG tablet Take 250 mcg by mouth daily.     Yes Historical Provider, MD  cephALEXin (KEFLEX) 500 MG capsule Take 1 capsule (500 mg total) by mouth 4 (four) times daily. For 5 days 07/08/14   Noland Fordyce, PA-C  HYDROcodone-acetaminophen (NORCO/VICODIN) 5-325 MG per tablet Take 1 tablet by mouth every 6 (six) hours as needed. 07/08/14   Noland Fordyce, PA-C   BP 129/64 mmHg  Pulse 82  Temp(Src) 98 F (36.7 C) (Oral)  Resp 16  Ht 5\' 11"  (1.803 m)  Wt 240 lb (108.863 kg)  BMI 33.49 kg/m2  SpO2 91% Physical Exam  Constitutional: He is oriented to person, place, and time. He appears well-developed and well-nourished.  HENT:  Head: Normocephalic and atraumatic.  Eyes: EOM are normal.  Neck: Normal range of motion.  Cardiovascular: Normal rate.   Pulses:      Dorsalis pedis pulses are 2+ on the left side.  Left foot: cap refill <3 seconds  Pulmonary/Chest: Effort normal.  Musculoskeletal: Normal range of motion. He exhibits tenderness.  Left leg: FROM left knee and ankle.  Mild tenderness to anterior left lower leg near laceration (see skin exam). No calf tenderness  Neurological: He is alert and oriented to person, place, and time.  Skin: Skin is warm and dry.  Left lower leg, anterior aspect: 4cm x 2cm triangular shaped laceration.  Apex of triangular skin flap darkened in color.  Bleeding controlled.  Psychiatric: He has a normal mood and  affect. His behavior is normal.  Nursing note and vitals reviewed.   ED Course  Procedures   LACERATION REPAIR Performed by: Noland Fordyce A. Authorized by: Gwenyth Bender Consent: Verbal consent obtained. Risks and benefits: risks, benefits and alternatives were discussed Consent given by: patient Patient identity confirmed: provided demographic data Prepped and Draped in normal sterile fashion Wound explored  Laceration Location: anterior left lower leg  Laceration Length: 4cm x 2cm  triangular in shape  No Foreign Bodies seen or palpated  Anesthesia: local infiltration  Local anesthetic: lidocaine 2% with epinephrine  Anesthetic total: 3 ml  Irrigation method: syringe Amount of cleaning: standard  Skin closure: 4-0 prolene  Number of sutures: 11 (eleven)  Technique: interrupted   Patient tolerance: Patient tolerated the procedure well with no immediate complications.  Tetanus: UTD (within last 2 years)   Labs Review Labs Reviewed - No data to display  Imaging Review No results found.   EKG Interpretation None      MDM   Final diagnoses:  Laceration of left lower leg, initial encounter    Pt is a 69yo male presenting to ED with laceration to anterior Left lower leg after a voting machine fell while he was trying to set it up.  No other injuries. Pt take 81mg  aspirin, no other blood thinners.  Left lower leg is neurovascularly in tact. Laceration was repaired with 11 (eleven) sutures.  Area is high tension and tip of laceration has mild darkening, concern the apex of laceration may not receive adequate blood flow as laceration begins to heal. Will place pt on 5 days of kelfex. Advised to have sutures removed in 10-14 days, f/u with PCP for suture removal and wound recheck, sooner if signs of infection. Home care instructions provided. Offered pt crutches as ambulation may add additional tension to laceration. Pt declined stating he has some at home.   Return precautions provided. Pt verbalized understanding and agreement with tx plan.      Noland Fordyce, PA-C 07/08/14 Cheshire Yao, MD 07/08/14 [phone]

## 2014-07-08 NOTE — ED Notes (Signed)
Applied bacitracin to pt's lower leg and gauze

## 2014-08-13 ENCOUNTER — Encounter: Payer: Self-pay | Admitting: Internal Medicine

## 2014-08-27 ENCOUNTER — Telehealth: Payer: Self-pay | Admitting: Internal Medicine

## 2014-08-27 NOTE — Telephone Encounter (Signed)
Called pt and appt scheduled to see MW tomorrow afternoon at 3p. Nothing further needed

## 2014-08-28 ENCOUNTER — Ambulatory Visit (INDEPENDENT_AMBULATORY_CARE_PROVIDER_SITE_OTHER): Payer: Medicare Other | Admitting: Internal Medicine

## 2014-08-28 ENCOUNTER — Encounter: Payer: Self-pay | Admitting: Internal Medicine

## 2014-08-28 VITALS — BP 112/70 | HR 97 | Temp 98.6°F | Ht 72.0 in | Wt 241.0 lb

## 2014-08-28 DIAGNOSIS — J449 Chronic obstructive pulmonary disease, unspecified: Secondary | ICD-10-CM

## 2014-08-28 MED ORDER — TRAMADOL HCL 50 MG PO TABS: ORAL_TABLET | ORAL | Status: DC

## 2014-08-28 MED ORDER — PREDNISONE 10 MG PO TABS: ORAL_TABLET | ORAL | Status: DC

## 2014-08-28 MED ORDER — CEFDINIR 300 MG PO CAPS: 300.0000 mg | ORAL_CAPSULE | Freq: Two times a day (BID) | ORAL | Status: DC
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
## 2014-08-28 NOTE — Patient Instructions (Addendum)
omnicef 300 mg twice daily x 10 days  Prednisone 10 mg take  4 each am x 2 days,   2 each am x 2 days,  1 each am x 2 days and stop   For cough  Mucinex dm 1200 mg every 12 hours as needed and tramadol 50 mg every 4 hours if needed (may cause drownsiness)   For shortness of breath/ congestion/wheezing  > albuterol up to 2 puffs every 4 hours as needed   Hold fish oil when coughing  Call if not better by first of week

## 2014-08-29 ENCOUNTER — Encounter: Payer: Self-pay | Admitting: Internal Medicine

## 2014-08-29 NOTE — Assessment & Plan Note (Addendum)
PFT's  03/16/07  FEV1  2.28 (66%) ratio 55 p 14% improvement p saba    I had an extended discussion with the patient reviewing all relevant studies completed to date and  lasting 15 to 20 minutes of a 25 minute visit on the following ongoing concerns:  1) his copd is well compensated chronically on just symbicort 160 2bid s/p smoking cessation in 2005 with rare need for saba  2) reviewed approp use of saba when acutely ill and call if not comfortable using up to 2 pff q 4 if needed  3) also reviewed management of cough with mucinex dm and add prn tramadol for excess/ painful cough  4) rec stop fish oil when coughing due to risk of cough inducing gerd inducing cough  5) it's a bit early in course for abx though pt insisted I give him what worked 2 years ago and records indicate it was omnicef/ pred  > See instructions for specific recommendations which were reviewed directly with the patient who was given a copy with highlighter outlining the key components.

## 2014-09-10 ENCOUNTER — Telehealth: Payer: Self-pay | Admitting: Internal Medicine

## 2014-09-10 MED ORDER — PREDNISONE 10 MG PO TABS: ORAL_TABLET | ORAL | Status: DC

## 2014-09-10 NOTE — Telephone Encounter (Signed)
We already tried omnicef so needs repeat ov with all meds in hand to see first available or Tammy  NP

## 2014-09-10 NOTE — Telephone Encounter (Signed)
Prednisone 10 mg take  4 each am x 2 days,   2 each am x 2 days,  1 each am x 2 days  Is all we can try s ov - if can't come here may need to go to UC there

## 2014-09-10 NOTE — Telephone Encounter (Signed)
[place]

## 2014-09-10 NOTE — Telephone Encounter (Signed)
Spoke with pt. Reports cough is getting worse. Cough is producing clear mucus. SOB and chest tightness are present. Would like an antibiotic sent in.  MW - please advise. Thanks.

## 2014-09-10 NOTE — Telephone Encounter (Signed)
pred taper sent in, pt aware of recs.  Nothing further needed.

## 2014-10-07 ENCOUNTER — Other Ambulatory Visit: Payer: Self-pay | Admitting: Internal Medicine

## 2014-10-07 DIAGNOSIS — H52203 Unspecified astigmatism, bilateral: Secondary | ICD-10-CM | POA: Diagnosis not present

## 2014-10-07 DIAGNOSIS — H2513 Age-related nuclear cataract, bilateral: Secondary | ICD-10-CM | POA: Diagnosis not present

## 2014-10-07 DIAGNOSIS — R918 Other nonspecific abnormal finding of lung field: Secondary | ICD-10-CM

## 2014-10-09 ENCOUNTER — Ambulatory Visit
Admission: RE | Admit: 2014-10-09 | Discharge: 2014-10-09 | Disposition: A | Discharge status: Home / Self Care | Payer: Medicare Other | Source: Ambulatory Visit | Attending: Internal Medicine | Admitting: Internal Medicine

## 2014-10-09 ENCOUNTER — Other Ambulatory Visit: Payer: Self-pay

## 2014-10-09 DIAGNOSIS — R918 Other nonspecific abnormal finding of lung field: Secondary | ICD-10-CM | POA: Diagnosis not present

## 2014-10-21 ENCOUNTER — Other Ambulatory Visit (HOSPITAL_COMMUNITY): Payer: Self-pay | Admitting: Internal Medicine

## 2014-10-21 ENCOUNTER — Other Ambulatory Visit: Payer: Self-pay | Admitting: Internal Medicine

## 2014-10-30 ENCOUNTER — Other Ambulatory Visit (HOSPITAL_COMMUNITY): Payer: Self-pay | Admitting: Internal Medicine

## 2014-11-18 ENCOUNTER — Other Ambulatory Visit: Payer: Self-pay | Admitting: Internal Medicine

## 2014-12-06 DIAGNOSIS — Z125 Encounter for screening for malignant neoplasm of prostate: Secondary | ICD-10-CM | POA: Diagnosis not present

## 2014-12-06 DIAGNOSIS — I10 Essential (primary) hypertension: Secondary | ICD-10-CM | POA: Diagnosis not present

## 2014-12-06 DIAGNOSIS — R7301 Impaired fasting glucose: Secondary | ICD-10-CM | POA: Diagnosis not present

## 2014-12-06 DIAGNOSIS — E785 Hyperlipidemia, unspecified: Secondary | ICD-10-CM | POA: Diagnosis not present

## 2014-12-06 DIAGNOSIS — R8290 Unspecified abnormal findings in urine: Secondary | ICD-10-CM | POA: Diagnosis not present

## 2014-12-12 DIAGNOSIS — I714 Abdominal aortic aneurysm, without rupture: Secondary | ICD-10-CM | POA: Diagnosis not present

## 2014-12-12 DIAGNOSIS — J449 Chronic obstructive pulmonary disease, unspecified: Secondary | ICD-10-CM | POA: Diagnosis not present

## 2014-12-12 DIAGNOSIS — Z1389 Encounter for screening for other disorder: Secondary | ICD-10-CM | POA: Diagnosis not present

## 2014-12-12 DIAGNOSIS — K5190 Ulcerative colitis, unspecified, without complications: Secondary | ICD-10-CM | POA: Diagnosis not present

## 2014-12-12 DIAGNOSIS — E291 Testicular hypofunction: Secondary | ICD-10-CM | POA: Diagnosis not present

## 2014-12-12 DIAGNOSIS — I10 Essential (primary) hypertension: Secondary | ICD-10-CM | POA: Diagnosis not present

## 2014-12-12 DIAGNOSIS — E8881 Metabolic syndrome: Secondary | ICD-10-CM | POA: Diagnosis not present

## 2014-12-12 DIAGNOSIS — Z6832 Body mass index (BMI) 32.0-32.9, adult: Secondary | ICD-10-CM | POA: Diagnosis not present

## 2014-12-12 DIAGNOSIS — Z0000 Encounter for general adult medical examination without abnormal findings: Secondary | ICD-10-CM | POA: Diagnosis not present

## 2014-12-12 DIAGNOSIS — E785 Hyperlipidemia, unspecified: Secondary | ICD-10-CM | POA: Diagnosis not present

## 2014-12-12 DIAGNOSIS — G4733 Obstructive sleep apnea (adult) (pediatric): Secondary | ICD-10-CM | POA: Diagnosis not present

## 2014-12-12 DIAGNOSIS — R7301 Impaired fasting glucose: Secondary | ICD-10-CM | POA: Diagnosis not present

## 2014-12-22 ENCOUNTER — Encounter: Payer: Self-pay | Admitting: Internal Medicine

## 2014-12-31 ENCOUNTER — Encounter: Payer: Self-pay | Admitting: Internal Medicine

## 2015-01-09 ENCOUNTER — Ambulatory Visit (INDEPENDENT_AMBULATORY_CARE_PROVIDER_SITE_OTHER): Payer: Medicare Other

## 2015-01-09 ENCOUNTER — Encounter: Payer: Self-pay | Admitting: Podiatry

## 2015-01-09 ENCOUNTER — Ambulatory Visit (INDEPENDENT_AMBULATORY_CARE_PROVIDER_SITE_OTHER): Payer: Medicare Other | Admitting: Podiatry

## 2015-01-09 VITALS — BP 122/68 | HR 91 | Resp 16 | Ht 72.0 in | Wt 238.0 lb

## 2015-01-09 DIAGNOSIS — L720 Epidermal cyst: Secondary | ICD-10-CM

## 2015-01-09 DIAGNOSIS — S99922A Unspecified injury of left foot, initial encounter: Secondary | ICD-10-CM

## 2015-01-09 DIAGNOSIS — M795 Residual foreign body in soft tissue: Secondary | ICD-10-CM

## 2015-01-09 NOTE — Progress Notes (Signed)
   Subjective:    Patient ID: Ricardo Cooper, male    DOB: 26-May-1945, 69 y.o.   MRN: [id number]  HPI  Patient presents with a toe injury on their left foot, base of pinky toe. Pt stated, "toe is sore". Pt stepped on a piece of glass about 5-6 weeks ago. Pt also said, "feels like something is in toe".   Review of Systems  Respiratory: Positive for shortness of breath.   Musculoskeletal: Positive for arthralgias.  Hematological: Bruises/bleeds easily.  All other systems reviewed and are negative.      Objective:   Physical Exam        Assessment & Plan:

## 2015-01-10 NOTE — Progress Notes (Signed)
Subjective:     Patient ID: Ricardo Cooper, male   DOB: 25-Jan-1946, 69 y.o.   MRN: [id number]  HPI patient points to the left fifth metatarsal fifth toe stating that he stepped on something and that it's been bothering him since   Review of Systems  All other systems reviewed and are negative.      Objective:   Physical Exam  Constitutional: He is oriented to person, place, and time.  Cardiovascular: Intact distal pulses.   Musculoskeletal: Normal range of motion.  Neurological: He is oriented to person, place, and time.  Skin: Skin is warm.  Nursing note and vitals reviewed.  neurovascular status found to be intact with muscle strength adequate and range of motion within normal limits. Patient is noted to have good digital perfusion is well oriented 3 and I noted on the left foot around the base of the fifth digit lateral side there is a keratotic lesion which appears to be traumatized with no proximal edema erythema drainage noted     Assessment:     Possible foreign body of the left fifth toe versus possible inclusion cyst    Plan:     H&P and x-ray reviewed. I did not note a piece of glass but it could've been let it and today we did a proximal nerve block of the fifth toe and under sterile conditions we opened up the area and did not note any overt 1 body. We cleaning the area applied sterile dressing and instructed on soaks and if symptoms were to reoccur or any other issues were to occur patient is to reappoint immediately

## 2015-01-11 DIAGNOSIS — Z23 Encounter for immunization: Secondary | ICD-10-CM | POA: Diagnosis not present

## 2015-01-22 DIAGNOSIS — M170 Bilateral primary osteoarthritis of knee: Secondary | ICD-10-CM | POA: Diagnosis not present

## 2015-01-22 DIAGNOSIS — M1712 Unilateral primary osteoarthritis, left knee: Secondary | ICD-10-CM | POA: Diagnosis not present

## 2015-01-27 ENCOUNTER — Other Ambulatory Visit (HOSPITAL_COMMUNITY): Payer: Self-pay | Admitting: Internal Medicine

## 2015-02-17 ENCOUNTER — Other Ambulatory Visit: Payer: Self-pay | Admitting: Internal Medicine

## 2015-02-17 ENCOUNTER — Other Ambulatory Visit (HOSPITAL_COMMUNITY): Payer: Self-pay | Admitting: Internal Medicine

## 2015-02-24 ENCOUNTER — Other Ambulatory Visit (HOSPITAL_COMMUNITY): Payer: Self-pay | Admitting: Internal Medicine

## 2015-02-25 ENCOUNTER — Ambulatory Visit (AMBULATORY_SURGERY_CENTER): Payer: Self-pay | Admitting: *Deleted

## 2015-02-25 VITALS — Ht 71.75 in | Wt 247.6 lb

## 2015-02-25 DIAGNOSIS — Z86010 Personal history of colonic polyps: Secondary | ICD-10-CM

## 2015-02-25 DIAGNOSIS — K5100 Ulcerative (chronic) pancolitis without complications: Secondary | ICD-10-CM

## 2015-02-25 NOTE — Progress Notes (Signed)
No egg or soy allergy No issues with past sedation No diet pills No home 02 use  emmi declined

## 2015-03-11 ENCOUNTER — Ambulatory Visit (AMBULATORY_SURGERY_CENTER): Payer: Medicare Other | Admitting: Internal Medicine

## 2015-03-11 ENCOUNTER — Encounter: Payer: Self-pay | Admitting: Internal Medicine

## 2015-03-11 ENCOUNTER — Encounter: Payer: Medicare Other | Admitting: Internal Medicine

## 2015-03-11 VITALS — BP 132/75 | HR 70 | Temp 97.5°F | Resp 16 | Ht 71.0 in | Wt 247.0 lb

## 2015-03-11 DIAGNOSIS — G4730 Sleep apnea, unspecified: Secondary | ICD-10-CM | POA: Diagnosis not present

## 2015-03-11 DIAGNOSIS — K5190 Ulcerative colitis, unspecified, without complications: Secondary | ICD-10-CM | POA: Diagnosis not present

## 2015-03-11 DIAGNOSIS — I10 Essential (primary) hypertension: Secondary | ICD-10-CM | POA: Diagnosis not present

## 2015-03-11 DIAGNOSIS — K5730 Diverticulosis of large intestine without perforation or abscess without bleeding: Secondary | ICD-10-CM | POA: Diagnosis not present

## 2015-03-11 DIAGNOSIS — K6389 Other specified diseases of intestine: Secondary | ICD-10-CM | POA: Diagnosis not present

## 2015-03-11 DIAGNOSIS — Z8719 Personal history of other diseases of the digestive system: Secondary | ICD-10-CM

## 2015-03-11 DIAGNOSIS — Z86010 Personal history of colonic polyps: Secondary | ICD-10-CM

## 2015-03-11 DIAGNOSIS — J449 Chronic obstructive pulmonary disease, unspecified: Secondary | ICD-10-CM | POA: Diagnosis not present

## 2015-03-11 MED ORDER — SODIUM CHLORIDE 0.9 % IV SOLN: 500.0000 mL | INTRAVENOUS | Status: DC

## 2015-03-11 NOTE — Progress Notes (Signed)
Called to room to assist during endoscopic procedure.  Patient ID and intended procedure confirmed with present staff. Received instructions for my participation in the procedure from the performing physician.  

## 2015-03-11 NOTE — Progress Notes (Signed)
To Pacu awake with spont resp vss,, report to RN

## 2015-03-11 NOTE — Progress Notes (Addendum)
Pt complaining of pain upon arrival to recovery room.  Abdomen is firm and distended.  Rates pain at an 8-9.  Per dr pyrtle's order used a rectal tube.  Moderate amount of air expelled.  Had pt turn over on opposite side to try and pass more air.

## 2015-03-11 NOTE — Progress Notes (Signed)
Pt states that he feels much better.  Passed a large amount of air.  Still crampy but rates pain at a 2 or 3.  Plan to discharge home.

## 2015-03-11 NOTE — Op Note (Addendum)
[place]  [address] [place], [id number]   COLONOSCOPY PROCEDURE REPORT  PATIENT: Ricardo Cooper, Ricardo Cooper  MR#: [id number] BIRTHDATE: Sep 14, 1945 , 68  yrs. old GENDER: male ENDOSCOPIST: Jerene Bears, MD PROCEDURE DATE:  03/11/2015 PROCEDURE:   Colonoscopy, surveillance and Colonoscopy with biopsy First Screening Colonoscopy - Avg.  risk and is 50 yrs.  old or older - No.  Prior Negative Screening - Now for repeat screening. N/A  History of Adenoma - Now for follow-up colonoscopy & has been > or = to 3 yrs.  N/A  Polyps removed today? No Recommend repeat exam, <10 yrs? Yes high risk ASA CLASS:   Class III INDICATIONS:history of left-sided colitis, history of inflammatory and pseudopolyps, history of adenomatous polyps. MEDICATIONS: Monitored anesthesia care and Propofol 400 mg IV  DESCRIPTION OF PROCEDURE:   After the risks benefits and alternatives of the procedure were thoroughly explained, informed consent was obtained.  The digital rectal exam revealed no rectal mass.   The LB [id number]  endoscope was introduced through the anus and advanced to the cecum, which was identified by both the appendix and ileocecal valve. No adverse events experienced. The quality of the prep was (MiraLax was used) fair clearing to good after irrigation and lavage.  The instrument was then slowly withdrawn as the colon was fully examined. Estimated blood loss is zero unless otherwise noted in this procedure report.   COLON FINDINGS: Mild erythema and loss of vascular pattern in the small and rectum.  The mucosal changes are mild and involve the first approximate 30 cm from the dentate line.  Multiple biopsies were obtained from this area to evaluate disease activity and exclude dysplasia.  No polypoid lesions seen.   Mild to moderate diverticulosis in the sigmoid and descending colon. Normal-appearing colonic mucosa in the cecum, ascending colon, transverse colon and  descending colon.  No polypoid lesions seen. Retroflexed views revealed internal hemorrhoids. The time to cecum = 13.0 Withdrawal time = 12.0   The scope was withdrawn and the procedure completed. COMPLICATIONS: There were no immediate complications.  ENDOSCOPIC IMPRESSION: 1.   Mild erythema and loss of vascular pattern in the small and rectum.  The mucosal changes are mild and involve the first approximate 30 cm from the dentate line.  Multiple biopsies were obtained from this area to evaluate disease activity and exclude dysplasia.  No polypoid lesions seen 2.   Mild to moderate diverticulosis in the sigmoid and descending colon 3.   Normal-appearing colonic mucosa in the cecum, ascending colon, transverse colon and descending colon.  No polypoid lesions seen  RECOMMENDATIONS: 1.  Await biopsy results 2.  Continue current medicine including mercaptopurine and sulfasalazine.  Monitor CBC, hepatic function panel 3.  Office follow-up in 1 year, sooner if needed  eSigned:  Jerene Bears, MD 03/11/2015 11:00 AM Revised: 03/11/2015 11:00 AM cc:  the patient, PCP

## 2015-03-11 NOTE — Patient Instructions (Signed)
Mild to moderate diverticulosis.  Multiple biopsies taken and sent to pathology.  Await biopsy results for final recommendations.    YOU HAD AN ENDOSCOPIC PROCEDURE TODAY AT [facility]:   Refer to the procedure report that was given to you for any specific questions about what was found during the examination.  If the procedure report does not answer your questions, please call your gastroenterologist to clarify.  If you requested that your care partner not be given the details of your procedure findings, then the procedure report has been included in a sealed envelope for you to review at your convenience later.  YOU SHOULD EXPECT: Some feelings of bloating in the abdomen. Passage of more gas than usual.  Walking can help get rid of the air that was put into your GI tract during the procedure and reduce the bloating. If you had a lower endoscopy (such as a colonoscopy or flexible sigmoidoscopy) you may notice spotting of blood in your stool or on the toilet paper. If you underwent a bowel prep for your procedure, you may not have a normal bowel movement for a few days.  Please Note:  You might notice some irritation and congestion in your nose or some drainage.  This is from the oxygen used during your procedure.  There is no need for concern and it should clear up in a day or so.  SYMPTOMS TO REPORT IMMEDIATELY:   Following lower endoscopy (colonoscopy or flexible sigmoidoscopy):  Excessive amounts of blood in the stool  Significant tenderness or worsening of abdominal pains  Swelling of the abdomen that is new, acute  Fever of 100F or higher   For urgent or emergent issues, a gastroenterologist can be reached at any hour by calling [phone].   DIET: Your first meal following the procedure should be a small meal and then it is ok to progress to your normal diet. Heavy or fried foods are harder to digest and may make you feel nauseous or bloated.  Likewise, meals  heavy in dairy and vegetables can increase bloating.  Drink plenty of fluids but you should avoid alcoholic beverages for 24 hours.  ACTIVITY:  You should plan to take it easy for the rest of today and you should NOT DRIVE or use heavy machinery until tomorrow (because of the sedation medicines used during the test).    FOLLOW UP: Our staff will call the number listed on your records the next business day following your procedure to check on you and address any questions or concerns that you may have regarding the information given to you following your procedure. If we do not reach you, we will leave a message.  However, if you are feeling well and you are not experiencing any problems, there is no need to return our call.  We will assume that you have returned to your regular daily activities without incident.  If any biopsies were taken you will be contacted by phone or by letter within the next 1-3 weeks.  Please call us at [phone] if you have not heard about the biopsies in 3 weeks.    SIGNATURES/CONFIDENTIALITY: You and/or your care partner have signed paperwork which will be entered into your electronic medical record.  These signatures attest to the fact that that the information above on your After Visit Summary has been reviewed and is understood.  Full responsibility of the confidentiality of this discharge information lies with you and/or your care-partner.

## 2015-03-11 NOTE — Progress Notes (Addendum)
Rates pain at a 7.    Instructed pt to try to get up on all 4's.  Able to pass large amount of air immediately.

## 2015-03-12 ENCOUNTER — Telehealth: Payer: Self-pay

## 2015-03-12 NOTE — Telephone Encounter (Signed)
  Follow up Call-  Call back number 03/11/2015 01/30/2013  Post procedure Call Back phone  # [phone]  Permission to leave phone message Yes Yes     Patient questions:  Do you have a fever, pain , or abdominal swelling? No. Pain Score  0 *  Have you tolerated food without any problems? Yes.    Have you been able to return to your normal activities? Yes.    Do you have any questions about your discharge instructions: Diet   No. Medications  No. Follow up visit  No.  Do you have questions or concerns about your Care? No.  Actions: * If pain score is 4 or above: No action needed, pain <4.

## 2015-03-17 ENCOUNTER — Other Ambulatory Visit: Payer: Self-pay | Admitting: Internal Medicine

## 2015-03-18 ENCOUNTER — Ambulatory Visit (INDEPENDENT_AMBULATORY_CARE_PROVIDER_SITE_OTHER): Payer: Medicare Other | Admitting: Adult Health

## 2015-03-18 ENCOUNTER — Encounter: Payer: Self-pay | Admitting: Adult Health

## 2015-03-18 ENCOUNTER — Encounter: Payer: Self-pay | Admitting: Internal Medicine

## 2015-03-18 VITALS — BP 138/76 | HR 86 | Temp 98.4°F | Ht 71.0 in | Wt 245.0 lb

## 2015-03-18 DIAGNOSIS — J449 Chronic obstructive pulmonary disease, unspecified: Secondary | ICD-10-CM

## 2015-03-18 MED ORDER — AZITHROMYCIN 250 MG PO TABS
ORAL_TABLET | ORAL | Status: AC
Start: 2015-03-18 — End: 2015-03-23

## 2015-03-18 MED ORDER — PREDNISONE 10 MG PO TABS: ORAL_TABLET | ORAL | Status: DC

## 2015-03-18 MED ORDER — LEVALBUTEROL HCL 0.63 MG/3ML IN NEBU
0.6300 mg | INHALATION_SOLUTION | Freq: Once | RESPIRATORY_TRACT | Status: AC
  Administered 2015-03-18: 0.63 mg via RESPIRATORY_TRACT

## 2015-03-18 NOTE — Progress Notes (Signed)
Subjective:    Patient ID: Ricardo Cooper, male    DOB: [birth year], 69 y.o.   MRN: [id number]  HPI 49 male with COPD GOLD II , OSA on CPAP    03/18/2015 Acute OV  Pt presents for an acute office visit.  Complains of prod cough with yellow mucus, increased SOB, sinus pressure/drainage, and wheezing during the morning since 03/16/15. Denies any chest tightness/congestion, fever, nausea or vomiting. Is worried he will get worse over the holidays.  Taking mucinex without much help. Remains on Symbicort Twice daily.     Past Medical History  Diagnosis Date  . Allergic rhinitis   . Paroxysmal atrial fibrillation ([place]) 2005    past trauma and infection  . Hyperlipidemia   . HTN (hypertension)   . Obstructive sleep apnea on CPAP   . Ulcerative colitis   . COPD (chronic obstructive pulmonary disease) ([place])   . Ulcerative colitis, left sided ([place])   . Diverticulosis   . Skin cancer     basel cell on nose,arm,leg  . Obesity   . GERD (gastroesophageal reflux disease)     no issues with CPAP-1999  . Sleep apnea     with CPAP  . Allergy   . Cataract     beginnings of cataracts    Current Outpatient Prescriptions on File Prior to Visit  Medication Sig Dispense Refill  . acetaminophen (TYLENOL) 500 MG tablet Take 500 mg by mouth every 6 (six) hours as needed for mild pain.    Marland Kitchen albuterol (PROVENTIL HFA) 108 (90 BASE) MCG/ACT inhaler Inhale 2 puffs into the lungs every 6 (six) hours as needed.      Marland Kitchen aspirin 81 MG tablet Take 81 mg by mouth daily.      . Cholecalciferol 2000 UNITS CAPS Take 1 capsule by mouth daily.    . Coenzyme Q10 200 MG capsule Take 200 mg by mouth daily.    Marland Kitchen diltiazem (CARDIZEM CD) 180 MG 24 hr capsule TAKE ONE CAPSULE EACH DAY 30 capsule 3  . fish oil-omega-3 fatty acids 1000 MG capsule Take 2 g by mouth daily.      Marland Kitchen glucosamine-chondroitin 500-400 MG tablet Take 1 tablet by mouth 2 (two) times daily.    Marland Kitchen losartan-hydrochlorothiazide (HYZAAR) 50-12.5 MG  tablet TAKE ONE TABLET EACH DAY 30 tablet 5  . Magnesium 250 MG TABS Take 1 tablet by mouth daily.    . mercaptopurine (PURINETHOL) 50 MG tablet TAKE TWO TABLETS EVERY DAY 60 tablet 5  . sulfaSALAzine (AZULFIDINE) 500 MG EC tablet TAKE 2 TABLETS IN THE MORNING AND TAKE 2TABLETS AT LUNCH AND TAKE 2 TABLETS AT DINNER 180 tablet 5  . SYMBICORT 160-4.5 MCG/ACT inhaler INHALE 2 PUFFS BY MOUTH TWICE DAILY 10.2 g 1  . vitamin B-12 (CYANOCOBALAMIN) 250 MCG tablet Take 1,000 mcg by mouth daily.     . [DISCONTINUED] CORTIFOAM 90 MG rectal foam     . [DISCONTINUED] diltiazem (DILACOR XR) 180 MG 24 hr capsule Take 1 capsule (180 mg total) by mouth daily. 30 capsule 6   No current facility-administered medications on file prior to visit.       Review of Systems Constitutional:   No  weight loss, night sweats,  Fevers, chills, fatigue, or  lassitude.  HEENT:   No headaches,  Difficulty swallowing,  Tooth/dental problems, or  Sore throat,                No sneezing, itching, ear ache,  +nasal congestion,  post nasal drip,   CV:  No chest pain,  Orthopnea, PND, swelling in lower extremities, anasarca, dizziness, palpitations, syncope.   GI  No heartburn, indigestion, abdominal pain, nausea, vomiting, diarrhea, change in bowel habits, loss of appetite, bloody stools.   Resp:   No chest wall deformity  Skin: no rash or lesions.  GU: no dysuria, change in color of urine, no urgency or frequency.  No flank pain, no hematuria   MS:  No joint pain or swelling.  No decreased range of motion.  No back pain.  Psych:  No change in mood or affect. No depression or anxiety.  No memory loss.         Objective:   Physical Exam GEN: A/Ox3; pleasant , NAD, elderly   HEENT:  [place]/AT,  EACs-clear, TMs-wnl, NOSE-clear, THROAT-clear, no lesions, no postnasal drip or exudate noted.   NECK:  Supple w/ fair ROM; no JVD; normal carotid impulses w/o bruits; no thyromegaly or nodules palpated; no  lymphadenopathy.  RESP  Decreased BS in bases no accessory muscle use, no dullness to percussion  CARD:  RRR, no m/r/g  , no peripheral edema, pulses intact, no cyanosis or clubbing.  GI:   Soft & nt; nml bowel sounds; no organomegaly or masses detected.  Musco: Warm bil, no deformities or joint swelling noted.   Neuro: alert, no focal deficits noted.    Skin: Warm, no lesions or rashes         Assessment & Plan:

## 2015-03-18 NOTE — Patient Instructions (Signed)
Zpack take as directed.  Mucinex DM Twice daily  As needed  Cough /congestion  Prednisone taper over next week.  Please contact office for sooner follow up if symptoms do not improve or worsen or seek emergency care  Follow up Dr. Lamonte Sakai  In 6 months and As needed

## 2015-03-24 NOTE — Assessment & Plan Note (Signed)
Flare   Plan  Zpack take as directed.  Mucinex DM Twice daily  As needed  Cough /congestion  Prednisone taper over next week.  Please contact office for sooner follow up if symptoms do not improve or worsen or seek emergency care  Follow up Dr. Lamonte Sakai  In 6 months and As needed

## 2015-05-16 ENCOUNTER — Telehealth: Payer: Self-pay | Admitting: Internal Medicine

## 2015-05-16 NOTE — Telephone Encounter (Signed)
Noted  

## 2015-05-17 ENCOUNTER — Other Ambulatory Visit: Payer: Self-pay | Admitting: Internal Medicine

## 2015-05-19 NOTE — Telephone Encounter (Signed)
CY Please advise; ok to fill or send to [facility]. Thanks.

## 2015-05-20 NOTE — Telephone Encounter (Signed)
Ok to refill x 1 year

## 2015-05-26 ENCOUNTER — Telehealth: Payer: Self-pay | Admitting: Internal Medicine

## 2015-05-26 DIAGNOSIS — G4733 Obstructive sleep apnea (adult) (pediatric): Secondary | ICD-10-CM

## 2015-05-26 NOTE — Telephone Encounter (Signed)
Spoke with pt. He needs an order sent to [facility] for CPAP supplies. Order has been sent. Advised pt that his insurance might not allow him to switch DME. He verbalized understanding. Nothing further was needed.

## 2015-05-26 NOTE — Telephone Encounter (Signed)
lmtcb x1 for pt. 

## 2015-05-26 NOTE — Telephone Encounter (Signed)
Advised [employer] for the third time that yes, patient does have ulcerative colitis.

## 2015-05-27 ENCOUNTER — Telehealth: Payer: Self-pay | Admitting: Internal Medicine

## 2015-05-27 NOTE — Telephone Encounter (Signed)
lmtcb x1 

## 2015-05-28 NOTE — Telephone Encounter (Signed)
lmtcb

## 2015-05-29 NOTE — Telephone Encounter (Signed)
lmtcb

## 2015-05-29 NOTE — Telephone Encounter (Signed)
[phone], pt cb

## 2015-05-29 NOTE — Telephone Encounter (Signed)
Called and spoke with patient.  He says that [place] is requesting a copy of our last OV notes and his Sleep Study.  Cannot locate Sleep Study in Epic.  Called [facility] to see if they have a copy of patient's sleep study from where he obtained his CPAP.  Spoke with Jeneen Rinks at [facility] and he says he will fax copy of Sleep study.  Awaiting copy to be faxed so I can send information requested to [facility].  Patient requesting call back confirming that information has been sent.  Hold in triage

## 2015-05-30 NOTE — Telephone Encounter (Signed)
Katie, did you receive the sleep study from [facility]?

## 2015-06-03 NOTE — Telephone Encounter (Signed)
No sleep study has come across my desk. Sorry.

## 2015-06-03 NOTE — Telephone Encounter (Signed)
Spoke with [facility], refaxing sleep study to our office at [facility].  Will hold message to look out for sleep study.

## 2015-06-03 NOTE — Telephone Encounter (Signed)
Katie do you have this sleep study? Thanks.

## 2015-06-04 ENCOUNTER — Telehealth: Payer: Self-pay | Admitting: Internal Medicine

## 2015-06-04 ENCOUNTER — Encounter: Payer: Self-pay | Admitting: Adult Health

## 2015-06-04 ENCOUNTER — Ambulatory Visit (INDEPENDENT_AMBULATORY_CARE_PROVIDER_SITE_OTHER): Payer: Medicare Other | Admitting: Adult Health

## 2015-06-04 VITALS — BP 138/70 | HR 104 | Temp 100.4°F | Ht 71.0 in | Wt 244.0 lb

## 2015-06-04 DIAGNOSIS — G4733 Obstructive sleep apnea (adult) (pediatric): Secondary | ICD-10-CM

## 2015-06-04 DIAGNOSIS — Z9989 Dependence on other enabling machines and devices: Secondary | ICD-10-CM

## 2015-06-04 DIAGNOSIS — J209 Acute bronchitis, unspecified: Secondary | ICD-10-CM | POA: Diagnosis not present

## 2015-06-04 DIAGNOSIS — J441 Chronic obstructive pulmonary disease with (acute) exacerbation: Secondary | ICD-10-CM

## 2015-06-04 MED ORDER — PREDNISONE 10 MG PO TABS: ORAL_TABLET | ORAL | Status: DC

## 2015-06-04 MED ORDER — AZITHROMYCIN 250 MG PO TABS: ORAL_TABLET | ORAL | Status: DC

## 2015-06-04 NOTE — Progress Notes (Signed)
Chief Complaint  Patient presents with  . Acute Visit    RB pt being treated for COPD- X2 days pt c/o low grade temp, prod cough with yellow mucus, sinus congestion, pnd, nose "feels raw", body aches.      Tests Spirometry 2014>> FEV1 2.27 (62%)  Past medical hx Past Medical History  Diagnosis Date  . Allergic rhinitis   . Paroxysmal atrial fibrillation ([place]) 2005    past trauma and infection  . Hyperlipidemia   . HTN (hypertension)   . Obstructive sleep apnea on CPAP   . Ulcerative colitis   . COPD (chronic obstructive pulmonary disease) ([place])   . Ulcerative colitis, left sided ([place])   . Diverticulosis   . Skin cancer     basel cell on nose,arm,leg  . Obesity   . GERD (gastroesophageal reflux disease)     no issues with CPAP-1999  . Sleep apnea     with CPAP  . Allergy   . Cataract     beginnings of cataracts      Past surgical hx, Allergies, Family hx, Social hx all reviewed.  Current Outpatient Prescriptions on File Prior to Visit  Medication Sig  . acetaminophen (TYLENOL) 500 MG tablet Take 500 mg by mouth every 6 (six) hours as needed for mild pain.  Marland Kitchen albuterol (PROVENTIL HFA) 108 (90 BASE) MCG/ACT inhaler Inhale 2 puffs into the lungs every 6 (six) hours as needed.    Marland Kitchen aspirin 81 MG tablet Take 81 mg by mouth daily.    . Cholecalciferol 2000 UNITS CAPS Take 1 capsule by mouth daily.  . Coenzyme Q10 200 MG capsule Take 200 mg by mouth daily.  Marland Kitchen diltiazem (CARDIZEM CD) 180 MG 24 hr capsule TAKE ONE CAPSULE EACH DAY  . fish oil-omega-3 fatty acids 1000 MG capsule Take 2 g by mouth daily.    Marland Kitchen glucosamine-chondroitin 500-400 MG tablet Take 1 tablet by mouth 2 (two) times daily.  Marland Kitchen losartan-hydrochlorothiazide (HYZAAR) 50-12.5 MG tablet TAKE ONE TABLET EACH DAY  . Magnesium 250 MG TABS Take 1 tablet by mouth daily as needed.   . mercaptopurine (PURINETHOL) 50 MG tablet TAKE TWO TABLETS EVERY DAY  . sulfaSALAzine (AZULFIDINE) 500 MG EC tablet TAKE 2 TABLETS IN  THE MORNING AND TAKE 2TABLETS AT LUNCH AND TAKE 2 TABLETS AT DINNER  . SYMBICORT 160-4.5 MCG/ACT inhaler INHALE 2 PUFFS BY MOUTH TWICE DAILY  . vitamin B-12 (CYANOCOBALAMIN) 250 MCG tablet Take 1,000 mcg by mouth daily.   . [DISCONTINUED] CORTIFOAM 90 MG rectal foam   . [DISCONTINUED] diltiazem (DILACOR XR) 180 MG 24 hr capsule Take 1 capsule (180 mg total) by mouth daily.   No current facility-administered medications on file prior to visit.     Vital Signs BP 138/70 mmHg  Pulse 104  Temp(Src) 100.4 F (38 C) (Oral)  Ht 5\' 11"  (1.803 m)  Wt 244 lb (110.678 kg)  BMI 34.05 kg/m2  SpO2 95%  History of Present Illness Ricardo Cooper is a 70 y.o. male former smoker (quit 2005) with hx HTN, OSA on CPAP, Gold II COPD on symbicort and PRN SABA.  Presents for acute OV with 3 day hx cough with thick yellow sputum, low grade fever, mild dyspnea although has not tried albuterol.  Also with runny nose, sore throat, malaise/myalgias.  Has been compliant with CPAP, uses distilled water. Denies chest pain, BLE edema, orthopnea, dizziness.  Last abx in November for AECOPD.  No known sick contacts, did have flu shot.  Physical Exam  General - pleasant male, uncomfortable appearing but No distress  ENT - No sinus tenderness, no oral exudate, no LAN Cardiac - s1s2 regular, no murmur Chest - resps even non labored, diminished throughout, mild exp wheeze, few scattered rhonchi Back - No focal tenderness Abd - Soft, non-tender Ext - No edema Neuro - Normal strength Skin - No rashes Psych - normal mood, and behavior   Assessment/Plan  AECOPD  Acute bronchitis  OSA - compliant with CPAP   Patient Instructions  z-pack - take as directed  Prednisone Take 4 tabs PO daily x 2 days, then 3 tabs PO daily x 2 days, then 2 tabs PO daily x2 days, then 1 tab PO daily x 2 days, then STOP mucinex DM twice daily as needed for cough/congestion  Continue symbicort Albuterol if needed  Follow up with  Dr. Annamaria Boots as scheduled  Please contact office for sooner follow up if symptoms do not improve or worsen or seek emergency care       Nickolas Madrid, NP 06/04/2015  11:25 AM

## 2015-06-04 NOTE — Patient Instructions (Addendum)
z-pack - take as directed  Prednisone Take 4 tabs PO daily x 2 days, then 3 tabs PO daily x 2 days, then 2 tabs PO daily x2 days, then 1 tab PO daily x 2 days, then STOP mucinex DM twice daily as needed for cough/congestion  Continue symbicort Albuterol if needed  Follow up with Dr. Annamaria Boots as scheduled  Please contact office for sooner follow up if symptoms do not improve or worsen or seek emergency care

## 2015-06-04 NOTE — Telephone Encounter (Addendum)
Spoke with [employer]. Compliance report has been reviewed by CY  Per Katie Show's good compliance Pressure setting 8-20 works well.   Called and spoke to patient's wife. She states they are in the lobby to see Ronnie Doss, NP and will discuss this at ov. I explained to her that if she has any questions to return our call. She voiced understanding and had no further questions. Nothing further needed.

## 2015-06-04 NOTE — Telephone Encounter (Signed)
LMTCB x 1 

## 2015-06-05 NOTE — Telephone Encounter (Signed)
Pt scheduled to see CDY 06/06/15 that was made by [facility] this AM. Nothing further needed

## 2015-06-06 ENCOUNTER — Ambulatory Visit (INDEPENDENT_AMBULATORY_CARE_PROVIDER_SITE_OTHER): Payer: Medicare Other | Admitting: Internal Medicine

## 2015-06-06 ENCOUNTER — Telehealth: Payer: Self-pay | Admitting: Internal Medicine

## 2015-06-06 ENCOUNTER — Encounter: Payer: Self-pay | Admitting: Internal Medicine

## 2015-06-06 VITALS — BP 126/80 | HR 68 | Ht 72.0 in | Wt 241.8 lb

## 2015-06-06 DIAGNOSIS — I480 Paroxysmal atrial fibrillation: Secondary | ICD-10-CM | POA: Diagnosis not present

## 2015-06-06 DIAGNOSIS — J111 Influenza due to unidentified influenza virus with other respiratory manifestations: Secondary | ICD-10-CM

## 2015-06-06 DIAGNOSIS — G4733 Obstructive sleep apnea (adult) (pediatric): Secondary | ICD-10-CM | POA: Diagnosis not present

## 2015-06-06 DIAGNOSIS — J441 Chronic obstructive pulmonary disease with (acute) exacerbation: Secondary | ICD-10-CM

## 2015-06-06 MED ORDER — METHYLPREDNISOLONE ACETATE 80 MG/ML IJ SUSP
80.0000 mg | Freq: Once | INTRAMUSCULAR | Status: AC
  Administered 2015-06-06: 80 mg via INTRAMUSCULAR

## 2015-06-06 MED ORDER — LEVALBUTEROL HCL 0.63 MG/3ML IN NEBU
0.6300 mg | INHALATION_SOLUTION | Freq: Once | RESPIRATORY_TRACT | Status: AC
  Administered 2015-06-06: 0.63 mg via RESPIRATORY_TRACT

## 2015-06-06 MED ORDER — AMOXICILLIN-POT CLAVULANATE 875-125 MG PO TABS: 1.0000 | ORAL_TABLET | Freq: Two times a day (BID) | ORAL | Status: DC

## 2015-06-06 NOTE — Telephone Encounter (Signed)
Called spoke with pt. He reports he saw Dr. Annamaria Boots today. He took his spouse to see her PCP after her he saw [place] today. She was swabbed for flu and came back positive. Pt states they then swabbed him and he was positive as well. He reports they both are on tamiflu now. He wanted to make [place] aware.  I have informed Dr. Annamaria Boots and nurses that worked with him today. Nothing further needed

## 2015-06-06 NOTE — Assessment & Plan Note (Signed)
Nurse noted irregular pulse by pulse oximeter on first arrival. By the time I examined him pulse was regular to palpation. He did not feel a difference. His history is of paroxysmal atrial fibrillation on aspirin and calcium channel blocker.

## 2015-06-06 NOTE — Progress Notes (Signed)
06/02/12- 70 yo m former smoker referred courtesy of Dr. Lamonte Sakai for obstructive sleep apnea. Dr. Lamonte Sakai follows for COPD with history of chest trauma complicated by history of ulcerative colitis on immunosuppression, hypertension. Sleep study was done around 1999 at [facility]. We are trying to get that documentation. He has been using CPAP 10 with nasal pillows mask and humidifier. Good compliance and control. Bedtime between 11 PM and midnight with estimated sleep latency 10-15 minutes, waking 3-5 times during the night before up between 7 and 8 AM. He emphasizes that he uses CPAP every night " he depends on it". Former smoker. History of paroxysmal atrial fibrillation only during chest injury. Previous ENT surgery for tonsils.  06/04/13- 67 yo m former smoker referred courtesy of Dr. Lamonte Sakai for obstructive sleep apnea. Dr. Lamonte Sakai follows for COPD with history of chest trauma complicated by history of ulcerative colitis on immunosuppression, hypertension. FOLLOWS FOR: wears CPAP 10/every night; DME is [facility]. would like to have CPAP as auto. Pressure ok, uses nasal pillows with chin strap. Good control. He does want to try autoPAP again.  07/10/13- 35 yo m former smoker referred courtesy of Dr. Lamonte Sakai for obstructive sleep apnea. Dr. Lamonte Sakai follows for COPD with history of chest trauma complicated by history of ulcerative colitis on immunosuppression, hypertension. FOLLOWS FOR: Review CPAP study with patient-states he felt better on auto and was not up and down as much-slept better overall. DME is [facility]. ? bronchitis Download confirms good use/compliance and indicated a good fixed pressure would be 14. He is resolving a recent acute bronchitis with some residual yellow sputum.  06/04/14-68 yo m former smoker referred courtesy of Dr. Lamonte Sakai for obstructive sleep apnea. Dr. Lamonte Sakai follows for COPD with history of chest trauma complicated by history of ulcerative colitis on immunosuppression, hypertension,  PAFib. FOLLOWS FOR: Wears CPAP auto 8-15/ [facility] every night for about 7-8 hours; DME is [facility] Still frequent waking after sleep onset and occasionally pull CPAP mask off. Benadryl helps sleep quality. We discussed insomnia  06/06/2015-70 year old male former smoker followed by me for OSA/ [place] and at this office for COPD complicated by history of chest trauma, ulcerative colitis on immunosuppression, HBP, PA fib CPAP auto 8-15/[facility] FOLLOWS FOR: wearing CPAP every night x7hrs.  denies any mask, pressure issues.  would also like to follow up from 2/8 acute visit for bronchitis, states he feels about 50% better- was given Zpak and prednisone then by NP. He states "I couldn't survive without CPAP". Download confirms excellent compliance and control at current AutoPap settings. He is very comfortable with mask and pressure but would like to change DME from [facility] to [place] which is close to him. He reports bronchitis symptoms are better than at recent visit with Nurse Practitioner at this office, with a few days of Zithromax and prednisone left. Dr. Brigitte Pulse is following small lung nodules with CT.  ROS-see HPI Constitutional:   No-   weight loss, night sweats, fevers, chills, fatigue, lassitude. HEENT:   No-  headaches, difficulty swallowing, tooth/dental problems, sore throat,       No-  sneezing, itching, ear ache, nasal congestion, post nasal drip,  CV:  No-   chest pain, orthopnea, PND, swelling in lower extremities, anasarca,  dizziness, palpitations Resp: + shortness of breath with exertion or at rest.              +productive cough,  No non-productive cough,  No- coughing up of blood.              +  change in color of mucus.  No- wheezing.   Skin: No-   rash or lesions. GI:  No-   heartburn, indigestion, GU: . MS:  No-   joint pain or swelling.  . Neuro-     nothing unusual Psych:  No- change in mood or affect.  No depression or anxiety.  No memory loss.  OBJ- Physical Exam General- Alert, Oriented, Affect-appropriate, Distress- none acute. Overweight, + looks rundown Skin- rash-none, lesions- none, excoriation- none Lymphadenopathy- none Head- atraumatic            Eyes- Gross vision intact, PERRLA, conjunctivae and secretions clear            Ears- Hearing, canals-normal            Nose- Clear, no-Septal dev, mucus, polyps, erosion, perforation             Throat- Mallampati IV , mucosa clear , drainage- none, tonsils- atrophic Neck- flexible , trachea midline, no stridor , thyroid nl, carotid no bruit Chest - symmetrical excursion , unlabored           Heart/CV- + pulse initially noted irregular by nurse but regular by the time I saw him. , no murmur , no gallop  , no rub, nl s1 s2                           - JVD- none , edema- none, stasis changes- none, varices- none           Lung- + diminished/ clear, wheeze + coarse wheezes/rhonchi,, cough+ hoarse , dullness-none, rub- none           Chest wall-  Abd-  Br/ Gen/ Rectal- Not done, not indicated Extrem- cyanosis- none, clubbing, none, atrophy- none, strength- nl Neuro- grossly intact to observation

## 2015-06-06 NOTE — Patient Instructions (Signed)
Neb xop 0.63     Dx acute exacerbation COPD  Depo 80  Printed script for augmentin antibiotic if the current Zpak and prednisone taper don't help enough  Order- Change DME from [facility] to [place] never has the supplies I need")             Continuation of CPAP auto 8-15, mask of choice, humidifier, supplies, [facility] [place]  Please call as needed

## 2015-06-06 NOTE — Assessment & Plan Note (Addendum)
He was seen here for acute bronchitic exacerbation of COPD on 2/9 and started Zithromax and prednisone. He reports partial improvement early in that course. I noted bilateral coarse wheezes/rhonchi and he looks tired or run down. With the weekend coming, I gave prescription for Augmentin to hold in case he was not adequately cleared after his initial treatment. He called back to state that he had accompanied his wife to her doctor's appointment where she was positive for influenza by nasal swab. They also checked him, also finding positive nasal swab for influenza, so they gave him Tamiflu. He had had flu vaccine on 12/28/2014

## 2015-06-19 ENCOUNTER — Telehealth: Payer: Self-pay | Admitting: Internal Medicine

## 2015-06-19 DIAGNOSIS — G4733 Obstructive sleep apnea (adult) (pediatric): Secondary | ICD-10-CM

## 2015-06-19 NOTE — Telephone Encounter (Signed)
I don't find a sleep study report either. Sounds as if that last one may have been with [facility]. Too old in any case. We will need to document that he currently still qualifies for CPAP. [facility] order an Unattended Home Sleep Test for dx OSA

## 2015-06-19 NOTE — Telephone Encounter (Signed)
Graham, CMA           We don't have a copy of a sleep study for this pt. It looks like he may have received a pap when he discharged from the hospital in July 2011. He may not have ever had a sleep study??   ----   Called spoke with pt. He reports his 1st sleep study was done back in 1999 over at [facility] and then 2nd study was done about 8-10 years ago across the street from [facility]. [facility] does not have his sleep study and I looked in pt chart and do not see a sleep study either. Please advise Dr. Annamaria Boots thanks

## 2015-06-19 NOTE — Telephone Encounter (Signed)
Called spoke with pt. He reports Psychiatrist is advising him they never received his sleep study from [place]. Per referral order the study was being faxed by [facility] but it looks like the sleep study was never received. Called [facility] and was on long hold. i have sent [facility] a staff message to see if she can locate the study and fax this or drop it off for [place]. Will await response back

## 2015-06-19 NOTE — Telephone Encounter (Signed)
Called spoke with pt. Aware will need to do HST since unable to locate sleep study results. He was in agreement. Order has been placed. Nothing further needed

## 2015-06-20 ENCOUNTER — Telehealth: Payer: Self-pay | Admitting: Internal Medicine

## 2015-06-20 NOTE — Telephone Encounter (Signed)
Mariann Laster from [facility] cb [phone]

## 2015-06-20 NOTE — Telephone Encounter (Signed)
Pt dropped off sleep study from 1999. I have faxed this to [facility]. Pt is aware they may not accept this study and still may need to repeat the HST. He reported if that [place] so, then he would do so.   Called [facility]--LMTCB x1 to advise sleep study sent as pt is requesting to get supplies ASAP.

## 2015-06-20 NOTE — Telephone Encounter (Signed)
Called spoke with Mariann Laster from [facility]  She did receive sleep study. She will contact pt. Called made pt aware. He needed nothing further

## 2015-06-23 ENCOUNTER — Other Ambulatory Visit (HOSPITAL_COMMUNITY): Payer: Self-pay | Admitting: Internal Medicine

## 2015-06-24 ENCOUNTER — Encounter: Payer: Self-pay | Admitting: Internal Medicine

## 2015-07-17 DIAGNOSIS — B079 Viral wart, unspecified: Secondary | ICD-10-CM | POA: Diagnosis not present

## 2015-07-17 DIAGNOSIS — L570 Actinic keratosis: Secondary | ICD-10-CM | POA: Diagnosis not present

## 2015-07-28 ENCOUNTER — Encounter: Payer: Self-pay | Admitting: Emergency Medicine

## 2015-07-28 ENCOUNTER — Ambulatory Visit (INDEPENDENT_AMBULATORY_CARE_PROVIDER_SITE_OTHER): Payer: Medicare Other | Admitting: Emergency Medicine

## 2015-07-28 VITALS — BP 140/70 | HR 86 | Ht 72.0 in | Wt 244.0 lb

## 2015-07-28 DIAGNOSIS — J449 Chronic obstructive pulmonary disease, unspecified: Secondary | ICD-10-CM

## 2015-07-28 DIAGNOSIS — J309 Allergic rhinitis, unspecified: Secondary | ICD-10-CM | POA: Diagnosis not present

## 2015-07-28 NOTE — Patient Instructions (Signed)
We will continue your Symbicort twice a day  Use albuterol as needed  We discussed today possibly adding a second inhaled medication to the symbicort. We will hold off at this time but revisit this if your breathing changes as we go forward Continue Zyrtec daily during the allergy season Follow with Dr Lamonte Sakai in 12 months or sooner if you have any problems
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
## 2015-07-28 NOTE — Assessment & Plan Note (Signed)
Continue Zyrtec once a day during the allergy season

## 2015-07-28 NOTE — Assessment & Plan Note (Signed)
He appears to be fairly stable. He is averaging 1 exacerbation annually. He has good exertional tolerance but does note that he may be having slightly more exertional shortness of breath then at our last visit in 2014. Approaches subject of adding LAMA to his existing regimen. For now he would like to defer. We will revisit depending on how his breathing changes  We will continue your Symbicort twice a day  Use albuterol as needed  We discussed today possibly adding a second inhaled medication to the symbicort. We will hold off at this time but revisit this if your breathing changes as we go forward Continue Zyrtec daily during the allergy season Follow with Dr Lamonte Sakai in 12 months or sooner if you have any problems

## 2015-08-11 ENCOUNTER — Other Ambulatory Visit (HOSPITAL_COMMUNITY): Payer: Self-pay | Admitting: Internal Medicine

## 2015-09-10 ENCOUNTER — Other Ambulatory Visit: Payer: Self-pay | Admitting: Internal Medicine

## 2015-10-09 ENCOUNTER — Other Ambulatory Visit: Payer: Self-pay | Admitting: Internal Medicine

## 2015-10-15 ENCOUNTER — Telehealth: Payer: Self-pay | Admitting: Internal Medicine

## 2015-10-15 ENCOUNTER — Other Ambulatory Visit (INDEPENDENT_AMBULATORY_CARE_PROVIDER_SITE_OTHER): Payer: Medicare Other

## 2015-10-15 ENCOUNTER — Other Ambulatory Visit: Payer: Self-pay | Admitting: Internal Medicine

## 2015-10-15 DIAGNOSIS — Z8719 Personal history of other diseases of the digestive system: Secondary | ICD-10-CM | POA: Diagnosis not present

## 2015-10-15 DIAGNOSIS — J984 Other disorders of lung: Secondary | ICD-10-CM

## 2015-10-15 LAB — CBC WITH DIFFERENTIAL/PLATELET
BASOS PCT: 0.8 % (ref 0.0–3.0)
Basophils Absolute: 0 10*3/uL (ref 0.0–0.1)
EOS ABS: 0.3 10*3/uL (ref 0.0–0.7)
Eosinophils Relative: 5.3 % — ABNORMAL HIGH (ref 0.0–5.0)
HEMATOCRIT: 46.2 % (ref 39.0–52.0)
Hemoglobin: 15.5 g/dL (ref 13.0–17.0)
LYMPHS ABS: 0.7 10*3/uL (ref 0.7–4.0)
LYMPHS PCT: 11.6 % — AB (ref 12.0–46.0)
MCHC: 33.5 g/dL (ref 30.0–36.0)
MCV: 95.4 fl (ref 78.0–100.0)
Monocytes Absolute: 0.5 10*3/uL (ref 0.1–1.0)
Monocytes Relative: 7.7 % (ref 3.0–12.0)
NEUTROS ABS: 4.4 10*3/uL (ref 1.4–7.7)
NEUTROS PCT: 74.6 % (ref 43.0–77.0)
PLATELETS: 194 10*3/uL (ref 150.0–400.0)
RBC: 4.84 Mil/uL (ref 4.22–5.81)
RDW: 14.4 % (ref 11.5–15.5)
WBC: 5.9 10*3/uL (ref 4.0–10.5)

## 2015-10-15 LAB — HEPATIC FUNCTION PANEL
ALT: 25 U/L (ref 0–53)
AST: 17 U/L (ref 0–37)
Albumin: 4.1 g/dL (ref 3.5–5.2)
Alkaline Phosphatase: 40 U/L (ref 39–117)
BILIRUBIN DIRECT: 0 mg/dL (ref 0.0–0.3)
TOTAL PROTEIN: 6.7 g/dL (ref 6.0–8.3)
Total Bilirubin: 0.5 mg/dL (ref 0.2–1.2)

## 2015-10-15 NOTE — Telephone Encounter (Signed)
Patient advised that lab orders are in [facility] and he can come Monday-Friday between 8-5 to have tests completed. He verbalizes understanding.

## 2015-10-15 NOTE — Telephone Encounter (Signed)
Dottie do you know about this?

## 2015-10-21 ENCOUNTER — Ambulatory Visit
Admission: RE | Admit: 2015-10-21 | Discharge: 2015-10-21 | Disposition: A | Discharge status: Home / Self Care | Payer: Medicare Other | Source: Ambulatory Visit | Attending: Internal Medicine | Admitting: Internal Medicine

## 2015-10-21 DIAGNOSIS — J984 Other disorders of lung: Secondary | ICD-10-CM

## 2015-10-24 ENCOUNTER — Other Ambulatory Visit (HOSPITAL_COMMUNITY): Payer: Self-pay | Admitting: Internal Medicine

## 2015-11-10 ENCOUNTER — Other Ambulatory Visit: Payer: Self-pay | Admitting: Internal Medicine

## 2015-11-22 ENCOUNTER — Other Ambulatory Visit (HOSPITAL_COMMUNITY): Payer: Self-pay | Admitting: Internal Medicine

## 2015-12-12 ENCOUNTER — Other Ambulatory Visit (HOSPITAL_COMMUNITY): Payer: Self-pay | Admitting: Internal Medicine

## 2015-12-17 ENCOUNTER — Other Ambulatory Visit: Payer: Self-pay | Admitting: Internal Medicine

## 2015-12-17 DIAGNOSIS — I714 Abdominal aortic aneurysm, without rupture, unspecified: Secondary | ICD-10-CM

## 2015-12-18 ENCOUNTER — Ambulatory Visit
Admission: RE | Admit: 2015-12-18 | Discharge: 2015-12-18 | Disposition: A | Discharge status: Home / Self Care | Payer: Medicare Other | Source: Ambulatory Visit | Attending: Internal Medicine | Admitting: Internal Medicine

## 2015-12-18 DIAGNOSIS — I714 Abdominal aortic aneurysm, without rupture, unspecified: Secondary | ICD-10-CM

## 2016-01-08 ENCOUNTER — Encounter: Payer: Self-pay | Admitting: Internal Medicine

## 2016-01-09 ENCOUNTER — Other Ambulatory Visit: Payer: Self-pay | Admitting: Internal Medicine

## 2016-02-19 ENCOUNTER — Other Ambulatory Visit: Payer: Self-pay | Admitting: Internal Medicine

## 2016-02-23 ENCOUNTER — Other Ambulatory Visit (HOSPITAL_COMMUNITY): Payer: Self-pay | Admitting: Internal Medicine

## 2016-03-22 ENCOUNTER — Other Ambulatory Visit: Payer: Self-pay | Admitting: Internal Medicine

## 2016-04-02 ENCOUNTER — Ambulatory Visit (INDEPENDENT_AMBULATORY_CARE_PROVIDER_SITE_OTHER): Payer: Medicare Other | Admitting: Internal Medicine

## 2016-04-02 ENCOUNTER — Encounter: Payer: Self-pay | Admitting: Internal Medicine

## 2016-04-02 VITALS — BP 154/70 | HR 88 | Ht 71.0 in | Wt 249.2 lb

## 2016-04-02 DIAGNOSIS — K5130 Ulcerative (chronic) rectosigmoiditis without complications: Secondary | ICD-10-CM

## 2016-04-02 MED ORDER — SULFASALAZINE 500 MG PO TBEC: DELAYED_RELEASE_TABLET | ORAL | 8 refills | Status: DC

## 2016-04-02 MED ORDER — MERCAPTOPURINE 50 MG PO TABS: ORAL_TABLET | ORAL | 8 refills | Status: DC

## 2016-04-02 NOTE — Patient Instructions (Signed)
We have sent the following medications to your pharmacy for you to pick up at your convenience: Sulfasalazine Mercaptopurine   Please follow up with Dr Hilarie Fredrickson in 1 year.  If you are age 69 or older, your body mass index should be between 23-30. Your Body mass index is 34.76 kg/m. If this is out of the aforementioned range listed, please consider follow up with your Primary Care Provider.  If you are age 63 or younger, your body mass index should be between 19-25. Your Body mass index is 34.76 kg/m. If this is out of the aformentioned range listed, please consider follow up with your Primary Care Provider.

## 2016-04-02 NOTE — Progress Notes (Signed)
Subjective:    Patient ID: Ricardo Cooper, male    DOB: 1945-06-01, 70 y.o.   MRN: [id number]  HPI Ricardo Cooper is a 70 year old male with a history of left-sided ulcerative colitis diagnosed in 2008, history of adenomatous polyps GERD, sleep apnea, COPD who is here for follow-up. He was seen at the time of his surveillance colonoscopy which occurred in November 2016. This showed mild erythema and loss of vascular pattern in the rectum and sigmoid. The rest the colon appeared normal. There was mild to moderate diverticulosis in the left colon. Biopsies showed chronic inactive proctocolitis.  He is continued 6-MP 100 mg daily and sulfasalazine 1 g 3 times a day.  He reports symptoms have been extremely well controlled. Bowel movements are regular without blood or melena. Stools are formed. No abdominal pain. No rashes. No oral ulcers. No new joint pains. No upper GI complaint including no dysphagia or odynophagia.   Review of Systems As per HPI, otherwise negative  Current Medications, Allergies, Past Medical History, Past Surgical History, Family History and Social History were reviewed in [employer] record.     Objective:   Physical Exam BP (!) 154/70   Pulse 88   Ht 5' 11" (1.803 m)   Wt 249 lb 4 oz (113.1 kg)   BMI 34.76 kg/m  Constitutional: Well-developed and well-nourished. No distress. HEENT: Normocephalic and atraumatic. Oropharynx is clear and moist. No oropharyngeal exudate. Conjunctivae are normal.  No scleral icterus. Neck: Neck supple. Trachea midline. Cardiovascular: Normal rate, regular rhythm and intact distal pulses. No M/R/G Pulmonary/chest: Effort normal and breath sounds normal. No wheezing, rales or rhonchi. Abdominal: Soft, nontender, nondistended. Bowel sounds active throughout. There are no masses palpable. No hepatosplenomegaly. Extremities: no clubbing, cyanosis, or edema Neurological: Alert and oriented to person place and  time. Skin: Skin is warm and dry.  Psychiatric: Normal mood and affect. Behavior is normal.  CBC    Component Value Date/Time   WBC 5.9 10/15/2015 1508   RBC 4.84 10/15/2015 1508   HGB 15.5 10/15/2015 1508   HCT 46.2 10/15/2015 1508   PLT 194.0 10/15/2015 1508   MCV 95.4 10/15/2015 1508   MCH 32.4 11/01/2009 0500   MCHC 33.5 10/15/2015 1508   RDW 14.4 10/15/2015 1508   LYMPHSABS 0.7 10/15/2015 1508   MONOABS 0.5 10/15/2015 1508   EOSABS 0.3 10/15/2015 1508   BASOSABS 0.0 10/15/2015 1508   CMP     Component Value Date/Time   NA 140 02/12/2014 1110   K 4.0 02/12/2014 1110   CL 103 02/12/2014 1110   CO2 31 02/12/2014 1110   GLUCOSE 107 (H) 02/12/2014 1110   BUN 20 02/12/2014 1110   CREATININE 1.2 02/12/2014 1110   CALCIUM 9.4 02/12/2014 1110   PROT 6.7 10/15/2015 1508   ALBUMIN 4.1 10/15/2015 1508   AST 17 10/15/2015 1508   ALT 25 10/15/2015 1508   ALKPHOS 40 10/15/2015 1508   BILITOT 0.5 10/15/2015 1508   GFRNONAA 70.38 03/23/2010 0847   GFRAA  11/01/2009 0500    >60        The eGFR has been calculated using the MDRD equation. This calculation has not been validated in all clinical situations. eGFR's persistently <60 mL/min signify possible Chronic Kidney Disease.       Assessment & Plan:  70 year old male with a history of left-sided ulcerative colitis diagnosed in 2008, history of adenomatous polyps GERD, sleep apnea, COPD who is here for follow-up  1. Left-sided  colitis -- symptomatic and endoscopic remission as of last surveillance exam. Will continue 6-MP 100 mg daily and sulfasalazine 1 g 3 times a day. He has a history of severe left-sided colitis prior to coming under control in the last 4 years. We discussed de-escalation therapy but I feel that he would be high risk for recurrence. Have recommended he continue current therapy after discussion of risks, benefits and alternative. He wishes to proceed with continued 6-MP and sulfasalazine  15 minutes  spent with the patient today. Greater than 50% was spent in counseling and coordination of care with the patient

## 2016-04-20 ENCOUNTER — Other Ambulatory Visit (HOSPITAL_COMMUNITY): Payer: Self-pay | Admitting: Internal Medicine

## 2016-04-23 ENCOUNTER — Ambulatory Visit (INDEPENDENT_AMBULATORY_CARE_PROVIDER_SITE_OTHER)
Admission: RE | Admit: 2016-04-23 | Discharge: 2016-04-23 | Disposition: A | Discharge status: Home / Self Care | Payer: Medicare Other | Source: Ambulatory Visit | Attending: Pulmonary Disease | Admitting: Pulmonary Disease

## 2016-04-23 ENCOUNTER — Encounter: Payer: Self-pay | Admitting: Pulmonary Disease

## 2016-04-23 ENCOUNTER — Ambulatory Visit (INDEPENDENT_AMBULATORY_CARE_PROVIDER_SITE_OTHER): Payer: Medicare Other | Admitting: Pulmonary Disease

## 2016-04-23 VITALS — BP 132/70 | HR 85 | Ht 71.0 in | Wt 248.0 lb

## 2016-04-23 DIAGNOSIS — J449 Chronic obstructive pulmonary disease, unspecified: Secondary | ICD-10-CM | POA: Diagnosis not present

## 2016-04-23 DIAGNOSIS — J209 Acute bronchitis, unspecified: Secondary | ICD-10-CM

## 2016-04-23 DIAGNOSIS — J40 Bronchitis, not specified as acute or chronic: Secondary | ICD-10-CM

## 2016-04-23 MED ORDER — DOXYCYCLINE HYCLATE 100 MG PO TABS: 100.0000 mg | ORAL_TABLET | Freq: Two times a day (BID) | ORAL | 0 refills | Status: DC

## 2016-04-23 MED ORDER — PREDNISONE 20 MG PO TABS: 20.0000 mg | ORAL_TABLET | Freq: Every day | ORAL | 0 refills | Status: AC

## 2016-04-23 NOTE — Assessment & Plan Note (Signed)
Recent bronchitis. Continue Symbicort, 160/4.5, 2 puffs twice a day. Prednisone taper. Antibiotics. Albuterol when necessary

## 2016-04-23 NOTE — Addendum Note (Signed)
Addended by: Benson Setting L on: 04/23/2016 03:06 PM   Modules accepted: Orders

## 2016-04-23 NOTE — Progress Notes (Signed)
   Subjective:    Patient ID: Ricardo Cooper, male    DOB: 1945/11/05, 70 y.o.   MRN: [id number]  HPI  Patient is being seen by the practice for COPD as well as obstructive sleep apnea. Last visit was in April 2017. He usually sees Dr. Lamonte Sakai.   ROV 04/23/16 Patient is here urgently for congestion, cough productive of yellow sputum. Has some wheezing and dyspnea. (-) fevers, chills. Uses symbicort.   Review of Systems  Constitutional: Negative.   HENT: Positive for congestion.   Eyes: Negative.   Respiratory: Positive for cough, shortness of breath and wheezing.   Cardiovascular: Negative.   Gastrointestinal: Negative.   Endocrine: Negative.   Genitourinary: Negative.   Musculoskeletal: Negative.   Skin: Negative.        Objective:   Physical Exam   Vitals:  Vitals:   04/23/16 1333  BP: 132/70  Pulse: 85  SpO2: 95%  Weight: 248 lb (112.5 kg)  Height: 5' 11"  (1.803 m)    Constitutional/General:  Pleasant, well-nourished, well-developed, not in any distress,  Comfortably seating.  Well kempt  Body mass index is 34.59 kg/m. Wt Readings from Last 3 Encounters:  04/23/16 248 lb (112.5 kg)  04/02/16 249 lb 4 oz (113.1 kg)  07/28/15 244 lb (110.7 kg)    Neck circumference:   HEENT: Pupils equal and reactive to light and accommodation. Anicteric sclerae. Normal nasal mucosa.   No oral  lesions,  mouth clear,  oropharynx clear, no postnasal drip. (-) Oral thrush. No dental caries.  Airway - Mallampati class III  Neck: No masses. Midline trachea. No JVD, (-) LAD. (-) bruits appreciated.  Respiratory/Chest: Grossly normal chest. (-) deformity. (-) Accessory muscle use.  Symmetric expansion. (-) Tenderness on palpation.  Resonant on percussion.  Diminished BS on both lower lung zones. (-) crackles, rhonchi. (+) wheezing.  (-) egophony  Cardiovascular: Regular rate and  rhythm, heart sounds normal, no murmur or gallops, no peripheral edema  Gastrointestinal:   Normal bowel sounds. Soft, non-tender. No hepatosplenomegaly.  (-) masses.   Musculoskeletal:  Normal muscle tone. Normal gait.   Extremities: Grossly normal. (-) clubbing, cyanosis.  (-) edema  Skin: (-) rash,lesions seen.   Neurological/Psychiatric : alert, oriented to time, place, person. Normal mood and affect         Assessment & Plan:  BRONCHITIS, ACUTE Recent cough, congestion, reductive sputum, wheezing. Denies fevers or chills. Some wheezing. Likely bronchitis. Doxycycline for a week as well as prednisone taper over a week. Chest x-ray to make sure there is no pneumonia. His chest x-ray is positive, we'll need to switch her on antibiotics. Patient advised to call if not better over the weekend.  COPD GOLD II Recent bronchitis. Continue Symbicort, 160/4.5, 2 puffs twice a day. Prednisone taper. Antibiotics. Albuterol when necessary  Return to clinic As scheduled with Dr. Lamonte Sakai.  Monica Becton, MD 04/23/2016, 1:52 PM [facility] Pager [phone] After 3 pm or if no answer, call [phone]

## 2016-04-23 NOTE — Assessment & Plan Note (Signed)
Recent cough, congestion, reductive sputum, wheezing. Denies fevers or chills. Some wheezing. Likely bronchitis. Doxycycline for a week as well as prednisone taper over a week. Chest x-ray to make sure there is no pneumonia. His chest x-ray is positive, we'll need to switch her on antibiotics. Patient advised to call if not better over the weekend.

## 2016-04-23 NOTE — Patient Instructions (Signed)
It was a pleasure taking care of you today!  You are diagnosed with bronchitis.  Make sure you take your antibiotics:  Doxycycline, 100 mg, one tablet twice a day for 7 days. Prednisone, 20 mg a day for 7 days.  We discussed potential side effects/adverse reactions of antibiotics, including, but not limited to: rash, diarrhea.  Please call the office if you are having adverse reaction to meds/antibiotics.  Please call the office your symptoms are getting worse despite the meds/antibiotics.   Follow up with Dr. Lamonte Sakai as scheduled.

## 2016-05-04 DIAGNOSIS — M722 Plantar fascial fibromatosis: Secondary | ICD-10-CM | POA: Diagnosis not present

## 2016-05-18 ENCOUNTER — Telehealth: Payer: Self-pay | Admitting: Internal Medicine

## 2016-05-18 DIAGNOSIS — G4733 Obstructive sleep apnea (adult) (pediatric): Secondary | ICD-10-CM

## 2016-05-18 NOTE — Telephone Encounter (Signed)
[facility] [phone] and [facility] [phone] and [employer] [facility] [phone] these are the ones his insurance covers pt walked in [phone]

## 2016-05-18 NOTE — Telephone Encounter (Signed)
atc pt, an unnamed woman answered phone, but line went dead after I asked to speak to pt.  atc back X2, line rang to fast busy signal.  Wcb.

## 2016-05-18 NOTE — Telephone Encounter (Signed)
lmtcb x1 for pt. 

## 2016-05-19 ENCOUNTER — Other Ambulatory Visit: Payer: Self-pay | Admitting: Internal Medicine

## 2016-05-19 NOTE — Telephone Encounter (Signed)
Spoke with the pt  He states he used to get his CPAP and supplies from [place], but they do not accept [place] now  He needs to switch to [facility], [facility], or [facility] and does not have a preference on which one  Order sent to [facility]

## 2016-05-31 DIAGNOSIS — G4733 Obstructive sleep apnea (adult) (pediatric): Secondary | ICD-10-CM | POA: Diagnosis not present

## 2016-05-31 DIAGNOSIS — J441 Chronic obstructive pulmonary disease with (acute) exacerbation: Secondary | ICD-10-CM | POA: Diagnosis not present

## 2016-05-31 DIAGNOSIS — J111 Influenza due to unidentified influenza virus with other respiratory manifestations: Secondary | ICD-10-CM | POA: Diagnosis not present

## 2016-05-31 DIAGNOSIS — I480 Paroxysmal atrial fibrillation: Secondary | ICD-10-CM | POA: Diagnosis not present

## 2016-06-02 ENCOUNTER — Encounter: Payer: Self-pay | Admitting: Internal Medicine

## 2016-06-07 ENCOUNTER — Ambulatory Visit: Payer: Self-pay | Admitting: Internal Medicine

## 2016-06-07 ENCOUNTER — Encounter: Payer: Self-pay | Admitting: Internal Medicine

## 2016-06-07 ENCOUNTER — Ambulatory Visit (INDEPENDENT_AMBULATORY_CARE_PROVIDER_SITE_OTHER): Payer: Medicare HMO | Admitting: Internal Medicine

## 2016-06-07 VITALS — BP 124/80 | HR 80 | Ht 71.0 in | Wt 240.4 lb

## 2016-06-07 DIAGNOSIS — I482 Chronic atrial fibrillation, unspecified: Secondary | ICD-10-CM

## 2016-06-07 DIAGNOSIS — J449 Chronic obstructive pulmonary disease, unspecified: Secondary | ICD-10-CM

## 2016-06-07 DIAGNOSIS — G4733 Obstructive sleep apnea (adult) (pediatric): Secondary | ICD-10-CM | POA: Diagnosis not present

## 2016-06-07 NOTE — Progress Notes (Signed)
HPI male former smoker followed for OSA/insomnia by Dr Annamaria Boots, and at this office/ Dr Lamonte Sakai for COPD GOLD complicated by history of chest trauma, also complicated by ulcerative colitis on immunosuppression, HBP, PAFib, small lung nodules  ( Dr Brigitte Pulse) NPSG 11/21/97- AHI 36/ hr, desaturation to 85%, body weight 215 lbs --------------------------------------------------------------------------------------------  06/06/2015-71 year old male former smoker followed by me for OSA/ [place] and at this office for COPD complicated by history of chest trauma, ulcerative colitis on immunosuppression, HBP, PA fib CPAP auto 8-15/[facility] FOLLOWS FOR: wearing CPAP every night x7hrs.  denies any mask, pressure issues.  would also like to follow up from 2/8 acute visit for bronchitis, states he feels about 50% better- was given Zpak and prednisone then by NP. He states "I couldn't survive without CPAP". Download confirms excellent compliance and control at current AutoPap settings. He is very comfortable with mask and pressure but would like to change DME from [facility] to [place] which is close to him. He reports bronchitis symptoms are better than at recent visit with Nurse Practitioner at this office, with a few days of Zithromax and prednisone left. Dr. Brigitte Pulse is following small lung nodules with CT.  06/07/2016-71 year old male former smoker followed for OSA/insomnia by me, and at this office for COPD GOLD complicated by history of chest trauma, also complicated by ulcerative colitis on immunosuppression, HBP, PAFib, small lung nodules (Dr Brigitte Pulse) CPAP auto 8-20 [facility] LOV this office in December for exacerbation COPD 1 year f/u for CPAP. Breathing has been the same since last visit.  Dr. Brigitte Pulse is following small lung nodules with CT CPAP download confirms 100% 4 hour compliance with excellent control AHI 1.3/hour. He wakes up sometimes at night but goes right back to sleep and feels well rested in the  daytime. Wife does not tell him he snores through CPAP. DME was changed to [place] because of insurance coverage CXR 04/23/2016- IMPRESSION: No acute cardiopulmonary disease.  ROS-see HPI   + = pos Constitutional:   No-   weight loss, night sweats, fevers, chills, fatigue, lassitude. HEENT:   No-  headaches, difficulty swallowing, tooth/dental problems, sore throat,       No-  sneezing, itching, ear ache, nasal congestion, post nasal drip,  CV:  No-   chest pain, orthopnea, PND, swelling in lower extremities, anasarca,                                                           dizziness, palpitations Resp: + shortness of breath with exertion or at rest.              productive cough,  No non-productive cough,  No- coughing up of blood.                change in color of mucus.  No- wheezing.   Skin: No-   rash or lesions. GI:  No-   heartburn, indigestion, GU: . MS:  No-   joint pain or swelling.  . Neuro-     nothing unusual Psych:  No- change in mood or affect. No depression or anxiety.  No memory loss.  OBJ- Physical Exam General- Alert, Oriented, Affect-appropriate, Distress- none acute. Overweight,  Skin- rash-none, lesions- none, excoriation- none Lymphadenopathy- none Head- atraumatic  Eyes- Gross vision intact, PERRLA, conjunctivae and secretions clear            Ears- Hearing, canals-normal            Nose- Clear, no-Septal dev, mucus, polyps, erosion, perforation             Throat- Mallampati IV , mucosa clear , drainage- none, tonsils- atrophic Neck- flexible , trachea midline, no stridor , thyroid nl, carotid no bruit Chest - symmetrical excursion , unlabored           Heart/CV- RRR, no murmur , no gallop  , no rub, nl s1 s2                           - JVD- none , edema- none, stasis changes- none, varices- none           Lung- + diminished/ clear, wheeze -none, cough-none , dullness-none, rub- none           Chest wall-  Abd-  Br/ Gen/ Rectal- Not done, not  indicated Extrem- cyanosis- none, clubbing, none, atrophy- none, strength- nl Neuro- grossly intact to observation

## 2016-06-07 NOTE — Assessment & Plan Note (Signed)
Currently at baseline feeling well controlled with no significant infection/ exacerbation this winter so far

## 2016-06-07 NOTE — Assessment & Plan Note (Signed)
Good compliance and control by his report and confirmed by download. He is pleased with auto titration and pressure ranges comfortable for him. We discussed mask choice opportunities but no changes are needed.

## 2016-06-07 NOTE — Assessment & Plan Note (Signed)
Pulse feels regular or very nearly so on exam consistent with good control of ventricular response rate. Followed by cardiology.

## 2016-06-07 NOTE — Patient Instructions (Addendum)
Order- DME [facility] continue CPAP auto 8-15, mask of choice, humidifier, supplies, [employer]View   Dx OSA  Please call as needed

## 2016-06-08 ENCOUNTER — Telehealth: Payer: Self-pay | Admitting: Internal Medicine

## 2016-06-08 NOTE — Telephone Encounter (Signed)
LMTCB

## 2016-06-09 NOTE — Telephone Encounter (Signed)
Spoke with someone from [place] and they stated they need pt.'s sleep study. Spoke with Ricardo Cooper who looked and saw they have his sleep study from 1999 and she stated she will re fax this sleep study to them. Nothing further is needed at this time.

## 2016-06-10 DIAGNOSIS — C44722 Squamous cell carcinoma of skin of right lower limb, including hip: Secondary | ICD-10-CM | POA: Diagnosis not present

## 2016-06-10 DIAGNOSIS — Z85828 Personal history of other malignant neoplasm of skin: Secondary | ICD-10-CM | POA: Diagnosis not present

## 2016-06-10 DIAGNOSIS — D485 Neoplasm of uncertain behavior of skin: Secondary | ICD-10-CM | POA: Diagnosis not present

## 2016-06-18 ENCOUNTER — Other Ambulatory Visit (HOSPITAL_COMMUNITY): Payer: Self-pay | Admitting: Internal Medicine

## 2016-06-18 DIAGNOSIS — G4733 Obstructive sleep apnea (adult) (pediatric): Secondary | ICD-10-CM | POA: Diagnosis not present

## 2016-06-21 ENCOUNTER — Other Ambulatory Visit (HOSPITAL_COMMUNITY): Payer: Self-pay | Admitting: *Deleted

## 2016-06-21 MED ORDER — DILTIAZEM HCL ER COATED BEADS 180 MG PO CP24: ORAL_CAPSULE | ORAL | 3 refills | Status: DC

## 2016-06-21 MED ORDER — LOSARTAN POTASSIUM-HCTZ 50-12.5 MG PO TABS: 1.0000 | ORAL_TABLET | Freq: Every day | ORAL | 3 refills | Status: DC

## 2016-06-22 DIAGNOSIS — G4733 Obstructive sleep apnea (adult) (pediatric): Secondary | ICD-10-CM | POA: Diagnosis not present

## 2016-06-27 ENCOUNTER — Encounter: Payer: Self-pay | Admitting: Internal Medicine

## 2016-07-16 DIAGNOSIS — G4733 Obstructive sleep apnea (adult) (pediatric): Secondary | ICD-10-CM | POA: Diagnosis not present

## 2016-07-20 ENCOUNTER — Other Ambulatory Visit (HOSPITAL_COMMUNITY): Payer: Self-pay | Admitting: Cardiology

## 2016-07-20 MED ORDER — LOSARTAN POTASSIUM-HCTZ 50-12.5 MG PO TABS: 1.0000 | ORAL_TABLET | Freq: Every day | ORAL | 1 refills | Status: DC

## 2016-07-26 ENCOUNTER — Ambulatory Visit (HOSPITAL_COMMUNITY)
Admission: RE | Admit: 2016-07-26 | Discharge: 2016-07-26 | Disposition: A | Discharge status: Home / Self Care | Payer: Medicare HMO | Source: Ambulatory Visit | Attending: Internal Medicine | Admitting: Internal Medicine

## 2016-07-26 VITALS — BP 136/64 | HR 81 | Wt 247.2 lb

## 2016-07-26 DIAGNOSIS — I480 Paroxysmal atrial fibrillation: Secondary | ICD-10-CM | POA: Insufficient documentation

## 2016-07-26 DIAGNOSIS — I4510 Unspecified right bundle-branch block: Secondary | ICD-10-CM | POA: Diagnosis not present

## 2016-07-26 DIAGNOSIS — E785 Hyperlipidemia, unspecified: Secondary | ICD-10-CM | POA: Diagnosis not present

## 2016-07-26 DIAGNOSIS — R911 Solitary pulmonary nodule: Secondary | ICD-10-CM | POA: Diagnosis not present

## 2016-07-26 DIAGNOSIS — I4891 Unspecified atrial fibrillation: Secondary | ICD-10-CM | POA: Insufficient documentation

## 2016-07-26 DIAGNOSIS — K5190 Ulcerative colitis, unspecified, without complications: Secondary | ICD-10-CM | POA: Insufficient documentation

## 2016-07-26 DIAGNOSIS — E669 Obesity, unspecified: Secondary | ICD-10-CM | POA: Insufficient documentation

## 2016-07-26 DIAGNOSIS — Z7982 Long term (current) use of aspirin: Secondary | ICD-10-CM | POA: Diagnosis not present

## 2016-07-26 DIAGNOSIS — J449 Chronic obstructive pulmonary disease, unspecified: Secondary | ICD-10-CM | POA: Diagnosis not present

## 2016-07-26 DIAGNOSIS — G4733 Obstructive sleep apnea (adult) (pediatric): Secondary | ICD-10-CM | POA: Insufficient documentation

## 2016-07-26 DIAGNOSIS — C44311 Basal cell carcinoma of skin of nose: Secondary | ICD-10-CM | POA: Insufficient documentation

## 2016-07-26 DIAGNOSIS — I10 Essential (primary) hypertension: Secondary | ICD-10-CM | POA: Diagnosis not present

## 2016-07-26 DIAGNOSIS — Z6834 Body mass index (BMI) 34.0-34.9, adult: Secondary | ICD-10-CM | POA: Insufficient documentation

## 2016-07-26 NOTE — Addendum Note (Signed)
Encounter addended by: Effie Berkshire, RN on: 07/26/2016  2:32 PM<BR>    Actions taken: Sign clinical note
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
## 2016-07-26 NOTE — Patient Instructions (Signed)
No changes to medication today.  No labs.  Follow up 12 months with Dr. Haroldine Laws. We will call you closer to this time, or you may call our office to schedule 1 month before you are due to be seen.  Do the following things EVERYDAY: 1) Weigh yourself in the morning before breakfast. Write it down and keep it in a log. 2) Take your medicines as prescribed 3) Eat low salt foods-Limit salt (sodium) to 2000 mg per day.  4) Stay as active as you can everyday 5) Limit all fluids for the day to less than 2 liters

## 2016-07-26 NOTE — Addendum Note (Signed)
Encounter addended by: Jolaine Artist, MD on: 07/26/2016  2:30 PM<BR>    Actions taken: Sign clinical note

## 2016-08-03 ENCOUNTER — Ambulatory Visit: Payer: Self-pay | Admitting: Emergency Medicine

## 2016-08-12 DIAGNOSIS — R05 Cough: Secondary | ICD-10-CM | POA: Diagnosis not present

## 2016-08-12 DIAGNOSIS — J208 Acute bronchitis due to other specified organisms: Secondary | ICD-10-CM | POA: Diagnosis not present

## 2016-08-12 DIAGNOSIS — J441 Chronic obstructive pulmonary disease with (acute) exacerbation: Secondary | ICD-10-CM | POA: Diagnosis not present

## 2016-08-12 DIAGNOSIS — R7301 Impaired fasting glucose: Secondary | ICD-10-CM | POA: Diagnosis not present

## 2016-08-12 DIAGNOSIS — Z1389 Encounter for screening for other disorder: Secondary | ICD-10-CM | POA: Diagnosis not present

## 2016-08-16 DIAGNOSIS — G4733 Obstructive sleep apnea (adult) (pediatric): Secondary | ICD-10-CM | POA: Diagnosis not present

## 2016-08-31 ENCOUNTER — Encounter: Payer: Self-pay | Admitting: Emergency Medicine

## 2016-08-31 ENCOUNTER — Ambulatory Visit (INDEPENDENT_AMBULATORY_CARE_PROVIDER_SITE_OTHER): Payer: Medicare HMO | Admitting: Emergency Medicine

## 2016-08-31 DIAGNOSIS — J449 Chronic obstructive pulmonary disease, unspecified: Secondary | ICD-10-CM

## 2016-08-31 DIAGNOSIS — G4733 Obstructive sleep apnea (adult) (pediatric): Secondary | ICD-10-CM | POA: Diagnosis not present

## 2016-08-31 MED ORDER — FLUTICASONE-UMECLIDIN-VILANT 100-62.5-25 MCG/INH IN AEPB: 1.0000 | INHALATION_SPRAY | Freq: Every day | RESPIRATORY_TRACT | 0 refills | Status: DC

## 2016-08-31 MED ORDER — FLUTICASONE-UMECLIDIN-VILANT 100-62.5-25 MCG/INH IN AEPB
1.0000 | INHALATION_SPRAY | Freq: Every day | RESPIRATORY_TRACT | 0 refills | Status: DC
Start: 2016-08-31 — End: 2016-08-31

## 2016-08-31 NOTE — Assessment & Plan Note (Signed)
Continue to use your CPAP every night.  Follow with Dr Lamonte Sakai in 12 months or sooner if you have any problems

## 2016-08-31 NOTE — Patient Instructions (Addendum)
We will stop Symbicort for now Start Trelegy one inhalation once a day. Rinse your mouth and gargle after using.  Call our office to let us know if you're able to obtain the Trelegy through your pharmacy (cost) Take albuterol 2 puffs up to every 4 hours if needed for shortness of breath.  Continue to use your CPAP every night.  Follow with Dr Lamonte Sakai in 12 months or sooner if you have any problems

## 2016-08-31 NOTE — Addendum Note (Signed)
Addended by: Jannette Spanner on: 08/31/2016 04:38 PM   Modules accepted: Orders
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
## 2016-08-31 NOTE — Assessment & Plan Note (Signed)
We will stop Symbicort for now Start Trelegy one inhalation once a day. Rinse your mouth and gargle after using.  Call our office to let us know if you're able to obtain the Trelegy through your pharmacy (cost) Take albuterol 2 puffs up to every 4 hours if needed for shortness of breath.

## 2016-09-02 ENCOUNTER — Telehealth: Payer: Self-pay

## 2016-09-02 NOTE — Telephone Encounter (Signed)
Received a fax from [employer] about pt's Trelegy. His insurance will not cover it and wants it to be changed to either Anoro or Bevespi. Please advise.

## 2016-09-06 NOTE — Telephone Encounter (Signed)
Neither Anoro nor bevespi have an ICS in them, which he probably needs as a frequent exacerbator. He had hoarseness with Symbicort. Could consider Anoro without the ICS, see if he benefits. Alternatively add an ICS to Anoro or bevespi later if needed. None of these options is as good as Trelegy, particularly since he has already trialed it and benefited.   Offer him > change to Anoro 1 inhalation daily for now, see if he likes it, benefits from it.

## 2016-09-14 ENCOUNTER — Other Ambulatory Visit: Payer: Self-pay | Admitting: Emergency Medicine

## 2016-09-15 ENCOUNTER — Encounter: Payer: Self-pay | Admitting: Emergency Medicine

## 2016-09-15 DIAGNOSIS — G4733 Obstructive sleep apnea (adult) (pediatric): Secondary | ICD-10-CM | POA: Diagnosis not present

## 2016-09-15 MED ORDER — FLUTICASONE-UMECLIDIN-VILANT 100-62.5-25 MCG/INH IN AEPB: 1.0000 | INHALATION_SPRAY | Freq: Every day | RESPIRATORY_TRACT | 5 refills | Status: DC

## 2016-09-15 NOTE — Telephone Encounter (Signed)
Replied back to patient e-mail about Trelegy.  Biomedical scientist for PA information.  PA # [id number] Patient ID # [id number]  Called [employer] to initiate PA for Trelegy Unable to initiate over the phone.  PA initiated on CMM Determination within 1-5 business days  Contact plan to follow up on KEY: [place] Will send to Lindsay's box for Jonelle Sidle to follow up  Patient Instructions  We will stop Symbicort for now Start Trelegy one inhalation once a day. Rinse your mouth and gargle after using.  Call our office to let us know if you're able to obtain the Trelegy through your pharmacy (cost) Take albuterol 2 puffs up to every 4 hours if needed for shortness of breath.  Continue to use your CPAP every night.  Follow with Dr Lamonte Sakai in 12 months or sooner if you have any problems

## 2016-09-16 NOTE — Telephone Encounter (Signed)
Prior authorization for Trelegy started on covermymeds. I advised pt, he states he still has enough symbicort just in case he runs out of Trelegy samples we provided for him. I will await the response from [facility] about coverage and advised pt that I would let him know. If PA is not approved, then we will send in Anoro per RB.

## 2016-09-17 NOTE — Telephone Encounter (Signed)
[employer] sent fax over approving Trelegy until 04/25/2017.

## 2016-09-22 DIAGNOSIS — K5190 Ulcerative colitis, unspecified, without complications: Secondary | ICD-10-CM | POA: Diagnosis not present

## 2016-09-22 DIAGNOSIS — Z7982 Long term (current) use of aspirin: Secondary | ICD-10-CM | POA: Diagnosis not present

## 2016-09-22 DIAGNOSIS — Z79899 Other long term (current) drug therapy: Secondary | ICD-10-CM | POA: Diagnosis not present

## 2016-09-22 DIAGNOSIS — Z8249 Family history of ischemic heart disease and other diseases of the circulatory system: Secondary | ICD-10-CM | POA: Diagnosis not present

## 2016-09-22 DIAGNOSIS — H9110 Presbycusis, unspecified ear: Secondary | ICD-10-CM | POA: Diagnosis not present

## 2016-09-22 DIAGNOSIS — I4892 Unspecified atrial flutter: Secondary | ICD-10-CM | POA: Diagnosis not present

## 2016-09-22 DIAGNOSIS — Z87891 Personal history of nicotine dependence: Secondary | ICD-10-CM | POA: Diagnosis not present

## 2016-09-22 DIAGNOSIS — M13162 Monoarthritis, not elsewhere classified, left knee: Secondary | ICD-10-CM | POA: Diagnosis not present

## 2016-09-22 DIAGNOSIS — I4891 Unspecified atrial fibrillation: Secondary | ICD-10-CM | POA: Diagnosis not present

## 2016-09-22 DIAGNOSIS — Z0000 Encounter for general adult medical examination without abnormal findings: Secondary | ICD-10-CM | POA: Diagnosis not present

## 2016-09-22 DIAGNOSIS — J449 Chronic obstructive pulmonary disease, unspecified: Secondary | ICD-10-CM | POA: Diagnosis not present

## 2016-09-22 DIAGNOSIS — I10 Essential (primary) hypertension: Secondary | ICD-10-CM | POA: Diagnosis not present

## 2016-09-22 DIAGNOSIS — M13161 Monoarthritis, not elsewhere classified, right knee: Secondary | ICD-10-CM | POA: Diagnosis not present

## 2016-09-22 DIAGNOSIS — Z9989 Dependence on other enabling machines and devices: Secondary | ICD-10-CM | POA: Diagnosis not present

## 2016-09-22 DIAGNOSIS — E7800 Pure hypercholesterolemia, unspecified: Secondary | ICD-10-CM | POA: Diagnosis not present

## 2016-09-22 NOTE — Telephone Encounter (Signed)
Pt sent over an email and stated that he received letter in the mail that the trelegy has been approved.  He will call back and let us know how much this is once he gets this filled per RB recs.

## 2016-10-01 ENCOUNTER — Telehealth: Payer: Self-pay | Admitting: Emergency Medicine

## 2016-10-01 NOTE — Telephone Encounter (Signed)
There is a corresponding telephone encounter 10/01/2016. This MyChart message will be closed.

## 2016-10-01 NOTE — Telephone Encounter (Signed)
Spoke to patient regarding his Trelegy inhaler. He said he was aware that was approved but he was not picking it up due to the high cost. It costs $189.96 for a month's supply. Told patient I would follow up with [employer] to see if there is anything more affordable.   Spoke with Holland Falling, they confirmed the high cost. There are three alternatives. Advair Diskus (all three) cost $60.94 a month. Breo (100 and 200) costs $69.50 a month. Anoro costs $89.71 a month.   Patient wishes to use [facility].   Dr. Lamonte Sakai, please advise if you are willing to switch to one of the alternatives.

## 2016-10-05 NOTE — Telephone Encounter (Signed)
lmtcb X1 for pt.  Will route to Lindsay's box for follow-up as this message was generated by cherina.

## 2016-10-05 NOTE — Telephone Encounter (Signed)
None of these inhalers is equivalent to the Trelegy.   We could go back to his prior Symbicort and try adding Spiriva or Incruse, see if this regimen is equivalent efficacy and less expensive.

## 2016-10-06 NOTE — Telephone Encounter (Signed)
Spoke with pt, advised that I spoke with Ria Comment and she was calling as Magda Paganini was on the phone with him, and that nothing further was needed.  Advised pt to advise when he hears back regarding Trelegy forms.  Pt expressed understanding.  Will route to [place] for follow-up.

## 2016-10-06 NOTE — Telephone Encounter (Signed)
Patient returning call- he can be reached at [phone] -pr

## 2016-10-06 NOTE — Telephone Encounter (Signed)
Spoke with the pt and notified of recs per [place]  He states that he submitted some forms to the drug company for financial assistance for Trelegy  He wants to wait until he hears back on this before we start him on anything else  Will await his call back

## 2016-10-06 NOTE — Telephone Encounter (Signed)
lmtcb x1 for pt. 

## 2016-10-06 NOTE — Telephone Encounter (Signed)
Pt returned phone call..ert

## 2016-10-13 DIAGNOSIS — L570 Actinic keratosis: Secondary | ICD-10-CM | POA: Diagnosis not present

## 2016-10-13 DIAGNOSIS — H2513 Age-related nuclear cataract, bilateral: Secondary | ICD-10-CM | POA: Diagnosis not present

## 2016-10-13 DIAGNOSIS — Z85828 Personal history of other malignant neoplasm of skin: Secondary | ICD-10-CM | POA: Diagnosis not present

## 2016-10-13 DIAGNOSIS — D1801 Hemangioma of skin and subcutaneous tissue: Secondary | ICD-10-CM | POA: Diagnosis not present

## 2016-10-13 DIAGNOSIS — D4989 Neoplasm of unspecified behavior of other specified sites: Secondary | ICD-10-CM | POA: Diagnosis not present

## 2016-10-13 DIAGNOSIS — L821 Other seborrheic keratosis: Secondary | ICD-10-CM | POA: Diagnosis not present

## 2016-10-13 DIAGNOSIS — H52203 Unspecified astigmatism, bilateral: Secondary | ICD-10-CM | POA: Diagnosis not present

## 2016-10-13 DIAGNOSIS — L918 Other hypertrophic disorders of the skin: Secondary | ICD-10-CM | POA: Diagnosis not present

## 2016-10-13 DIAGNOSIS — B078 Other viral warts: Secondary | ICD-10-CM | POA: Diagnosis not present

## 2016-10-13 DIAGNOSIS — D692 Other nonthrombocytopenic purpura: Secondary | ICD-10-CM | POA: Diagnosis not present

## 2016-10-16 DIAGNOSIS — G4733 Obstructive sleep apnea (adult) (pediatric): Secondary | ICD-10-CM | POA: Diagnosis not present

## 2016-10-18 ENCOUNTER — Other Ambulatory Visit (HOSPITAL_COMMUNITY): Payer: Self-pay | Admitting: Internal Medicine

## 2016-10-18 ENCOUNTER — Encounter: Payer: Self-pay | Admitting: Emergency Medicine

## 2016-10-18 MED ORDER — FLUTICASONE-UMECLIDIN-VILANT 100-62.5-25 MCG/INH IN AEPB: 1.0000 | INHALATION_SPRAY | Freq: Every day | RESPIRATORY_TRACT | 0 refills | Status: AC

## 2016-10-18 NOTE — Telephone Encounter (Signed)
6.25.18 e-mail from patient: Message   Hello all,  I had requested assistance from [place] for the [place] as my cost for this Rx is $187.00.  I have just received notice that I was rejected for aide. I am going to get the prescription filled as I believe it was working for me and helping my condition. I'll call [facility] and have it filled today.  Thanks for your help in this matter.  Regards,  Ricardo R. Barbarann Ehlers) She[place]  Aug 16, 1945    E-mail sent to patient to see if [place] gave him a denial reason 2 samples obtained for pt (and documented) - asked him if he received a Free 30-day Inhaler coupon.  If not, will need to add one to the samples. Will leave samples in triage at the [facility] desk in case coupon needs to be added

## 2016-10-21 ENCOUNTER — Other Ambulatory Visit: Payer: Self-pay | Admitting: Internal Medicine

## 2016-10-21 DIAGNOSIS — J449 Chronic obstructive pulmonary disease, unspecified: Secondary | ICD-10-CM

## 2016-10-21 DIAGNOSIS — F17201 Nicotine dependence, unspecified, in remission: Secondary | ICD-10-CM

## 2016-10-21 DIAGNOSIS — R911 Solitary pulmonary nodule: Secondary | ICD-10-CM

## 2016-10-22 ENCOUNTER — Other Ambulatory Visit: Payer: Self-pay

## 2016-11-08 ENCOUNTER — Other Ambulatory Visit: Payer: Self-pay | Admitting: Internal Medicine

## 2016-11-08 DIAGNOSIS — R600 Localized edema: Secondary | ICD-10-CM | POA: Diagnosis not present

## 2016-11-08 DIAGNOSIS — M7989 Other specified soft tissue disorders: Secondary | ICD-10-CM

## 2016-11-08 DIAGNOSIS — M25473 Effusion, unspecified ankle: Secondary | ICD-10-CM | POA: Diagnosis not present

## 2016-11-09 ENCOUNTER — Ambulatory Visit
Admission: RE | Admit: 2016-11-09 | Discharge: 2016-11-09 | Disposition: A | Discharge status: Home / Self Care | Payer: Medicare HMO | Source: Ambulatory Visit | Attending: Internal Medicine | Admitting: Internal Medicine

## 2016-11-09 DIAGNOSIS — R600 Localized edema: Secondary | ICD-10-CM | POA: Diagnosis not present

## 2016-11-09 DIAGNOSIS — M7989 Other specified soft tissue disorders: Secondary | ICD-10-CM

## 2016-11-15 DIAGNOSIS — G4733 Obstructive sleep apnea (adult) (pediatric): Secondary | ICD-10-CM | POA: Diagnosis not present

## 2016-11-22 DIAGNOSIS — Z6833 Body mass index (BMI) 33.0-33.9, adult: Secondary | ICD-10-CM | POA: Diagnosis not present

## 2016-11-22 DIAGNOSIS — J189 Pneumonia, unspecified organism: Secondary | ICD-10-CM | POA: Diagnosis not present

## 2016-11-22 DIAGNOSIS — J449 Chronic obstructive pulmonary disease, unspecified: Secondary | ICD-10-CM | POA: Diagnosis not present

## 2016-11-22 DIAGNOSIS — R05 Cough: Secondary | ICD-10-CM | POA: Diagnosis not present

## 2016-12-13 DIAGNOSIS — E784 Other hyperlipidemia: Secondary | ICD-10-CM | POA: Diagnosis not present

## 2016-12-13 DIAGNOSIS — R8299 Other abnormal findings in urine: Secondary | ICD-10-CM | POA: Diagnosis not present

## 2016-12-13 DIAGNOSIS — R7301 Impaired fasting glucose: Secondary | ICD-10-CM | POA: Diagnosis not present

## 2016-12-13 DIAGNOSIS — I10 Essential (primary) hypertension: Secondary | ICD-10-CM | POA: Diagnosis not present

## 2016-12-13 DIAGNOSIS — Z125 Encounter for screening for malignant neoplasm of prostate: Secondary | ICD-10-CM | POA: Diagnosis not present

## 2016-12-14 ENCOUNTER — Encounter: Payer: Self-pay | Admitting: *Deleted

## 2016-12-16 DIAGNOSIS — G4733 Obstructive sleep apnea (adult) (pediatric): Secondary | ICD-10-CM | POA: Diagnosis not present

## 2016-12-20 ENCOUNTER — Other Ambulatory Visit: Payer: Self-pay | Admitting: Internal Medicine

## 2016-12-20 DIAGNOSIS — Z23 Encounter for immunization: Secondary | ICD-10-CM | POA: Diagnosis not present

## 2016-12-20 DIAGNOSIS — Z0000 Encounter for general adult medical examination without abnormal findings: Secondary | ICD-10-CM | POA: Diagnosis not present

## 2016-12-20 DIAGNOSIS — F17211 Nicotine dependence, cigarettes, in remission: Secondary | ICD-10-CM

## 2016-12-20 DIAGNOSIS — E8881 Metabolic syndrome: Secondary | ICD-10-CM | POA: Diagnosis not present

## 2016-12-20 DIAGNOSIS — R911 Solitary pulmonary nodule: Secondary | ICD-10-CM

## 2016-12-20 DIAGNOSIS — E784 Other hyperlipidemia: Secondary | ICD-10-CM | POA: Diagnosis not present

## 2016-12-20 DIAGNOSIS — I10 Essential (primary) hypertension: Secondary | ICD-10-CM | POA: Diagnosis not present

## 2016-12-20 DIAGNOSIS — G4733 Obstructive sleep apnea (adult) (pediatric): Secondary | ICD-10-CM | POA: Diagnosis not present

## 2016-12-20 DIAGNOSIS — Z8679 Personal history of other diseases of the circulatory system: Secondary | ICD-10-CM | POA: Diagnosis not present

## 2016-12-20 DIAGNOSIS — J449 Chronic obstructive pulmonary disease, unspecified: Secondary | ICD-10-CM | POA: Diagnosis not present

## 2016-12-20 DIAGNOSIS — Z6833 Body mass index (BMI) 33.0-33.9, adult: Secondary | ICD-10-CM | POA: Diagnosis not present

## 2016-12-20 DIAGNOSIS — R7301 Impaired fasting glucose: Secondary | ICD-10-CM | POA: Diagnosis not present

## 2016-12-24 ENCOUNTER — Ambulatory Visit
Admission: RE | Admit: 2016-12-24 | Discharge: 2016-12-24 | Disposition: A | Discharge status: Home / Self Care | Payer: Medicare HMO | Source: Ambulatory Visit | Attending: Internal Medicine | Admitting: Internal Medicine

## 2016-12-24 DIAGNOSIS — R911 Solitary pulmonary nodule: Secondary | ICD-10-CM

## 2016-12-24 DIAGNOSIS — F17211 Nicotine dependence, cigarettes, in remission: Secondary | ICD-10-CM

## 2016-12-24 DIAGNOSIS — Z87891 Personal history of nicotine dependence: Secondary | ICD-10-CM | POA: Diagnosis not present

## 2017-01-06 ENCOUNTER — Other Ambulatory Visit: Payer: Self-pay | Admitting: Internal Medicine

## 2017-01-16 DIAGNOSIS — G4733 Obstructive sleep apnea (adult) (pediatric): Secondary | ICD-10-CM | POA: Diagnosis not present

## 2017-02-15 ENCOUNTER — Other Ambulatory Visit (HOSPITAL_COMMUNITY): Payer: Self-pay | Admitting: Internal Medicine

## 2017-02-15 DIAGNOSIS — G4733 Obstructive sleep apnea (adult) (pediatric): Secondary | ICD-10-CM | POA: Diagnosis not present

## 2017-03-09 ENCOUNTER — Other Ambulatory Visit: Payer: Self-pay | Admitting: Internal Medicine

## 2017-03-14 ENCOUNTER — Other Ambulatory Visit (INDEPENDENT_AMBULATORY_CARE_PROVIDER_SITE_OTHER): Payer: Medicare HMO

## 2017-03-14 ENCOUNTER — Encounter: Payer: Self-pay | Admitting: Internal Medicine

## 2017-03-14 ENCOUNTER — Ambulatory Visit: Payer: Medicare HMO | Admitting: Internal Medicine

## 2017-03-14 VITALS — BP 132/60 | HR 80 | Ht 70.75 in | Wt 247.1 lb

## 2017-03-14 DIAGNOSIS — K5130 Ulcerative (chronic) rectosigmoiditis without complications: Secondary | ICD-10-CM | POA: Diagnosis not present

## 2017-03-14 DIAGNOSIS — Z79899 Other long term (current) drug therapy: Secondary | ICD-10-CM | POA: Diagnosis not present

## 2017-03-14 DIAGNOSIS — D7589 Other specified diseases of blood and blood-forming organs: Secondary | ICD-10-CM

## 2017-03-14 LAB — CBC WITH DIFFERENTIAL/PLATELET
BASOS PCT: 1.2 % (ref 0.0–3.0)
Basophils Absolute: 0.1 10*3/uL (ref 0.0–0.1)
EOS PCT: 4.5 % (ref 0.0–5.0)
Eosinophils Absolute: 0.2 10*3/uL (ref 0.0–0.7)
HCT: 47.8 % (ref 39.0–52.0)
Hemoglobin: 15.5 g/dL (ref 13.0–17.0)
LYMPHS ABS: 0.6 10*3/uL — AB (ref 0.7–4.0)
Lymphocytes Relative: 10.8 % — ABNORMAL LOW (ref 12.0–46.0)
MCHC: 32.5 g/dL (ref 30.0–36.0)
MCV: 101.3 fl — AB (ref 78.0–100.0)
MONOS PCT: 12.4 % — AB (ref 3.0–12.0)
Monocytes Absolute: 0.7 10*3/uL (ref 0.1–1.0)
NEUTROS PCT: 71.1 % (ref 43.0–77.0)
Neutro Abs: 3.8 10*3/uL (ref 1.4–7.7)
Platelets: 191 10*3/uL (ref 150.0–400.0)
RBC: 4.72 Mil/uL (ref 4.22–5.81)
RDW: 15.4 % (ref 11.5–15.5)
WBC: 5.4 10*3/uL (ref 4.0–10.5)

## 2017-03-14 LAB — COMPREHENSIVE METABOLIC PANEL
ALK PHOS: 36 U/L — AB (ref 39–117)
ALT: 20 U/L (ref 0–53)
AST: 18 U/L (ref 0–37)
Albumin: 4.1 g/dL (ref 3.5–5.2)
BUN: 21 mg/dL (ref 6–23)
CHLORIDE: 105 meq/L (ref 96–112)
CO2: 29 meq/L (ref 19–32)
Calcium: 9.3 mg/dL (ref 8.4–10.5)
Creatinine, Ser: 0.89 mg/dL (ref 0.40–1.50)
GFR: 89.82 mL/min (ref >60.00)
GLUCOSE: 103 mg/dL — AB (ref 70–99)
POTASSIUM: 3.8 meq/L (ref 3.5–5.1)
SODIUM: 139 meq/L (ref 135–145)
TOTAL PROTEIN: 6.8 g/dL (ref 6.0–8.3)
Total Bilirubin: 0.5 mg/dL (ref 0.2–1.2)

## 2017-03-14 MED ORDER — MERCAPTOPURINE 50 MG PO TABS: ORAL_TABLET | ORAL | 8 refills | Status: DC

## 2017-03-14 NOTE — Patient Instructions (Signed)
Your physician has requested that you go to the basement for the following lab work before leaving today: CBC, CMP  Continue 6MP and sulfasalazine.  Please follow up with Dr Hilarie Fredrickson in 9 months.  If you are age 70 or older, your body mass index should be between 23-30. Your Body mass index is 34.71 kg/m. If this is out of the aforementioned range listed, please consider follow up with your Primary Care Provider.  If you are age 23 or younger, your body mass index should be between 19-25. Your Body mass index is 34.71 kg/m. If this is out of the aformentioned range listed, please consider follow up with your Primary Care Provider.

## 2017-03-14 NOTE — Progress Notes (Signed)
Subjective:    Patient ID: Ricardo Cooper, male    DOB: December 06, 1945, 71 y.o.   MRN: [id number]  HPI Ricardo Cooper is a 71 year old male with ulcerative proctosigmoiditis diagnosed in 2008, history of adenomatous colon polyps, GERD, sleep apnea and COPD who is here for follow-up.  He was last seen in December 2017.  He is here alone today.  He has been maintained on 6-MP 100 mg daily and sulfasalazine 1 g 3 times daily.  He reports he is feeling remarkably well from a GI perspective.  He has absolutely no signs of his previous colitis.  He can get at times slightly constipated and so he is using stool softeners on a regular basis and occasionally MiraLAX.  He is having no abdominal pain, no rectal pain no tenesmus.  Normal formed stools which are not loose without diarrhea.  Denies blood in his stool and melena.  Good appetite.  Normal weight.  No upper GI complaints or hepatobiliary complaints.  His last colonoscopy was 2 years ago which showed a mild erythema in the rectum and sigmoid with mild to moderate diverticulosis in the left colon.  Surveillance biopsies in the rectum and sigmoid showed chronic inactive proctocolitis without dysplasia.  There were no polyps on this exam.  In previous colonoscopies he had had some inflammatory pseudopolyps and prior to this adenomatous polyps.  Of note he has never had adenomatous polyps within the segment of colon which previously was affected by active colitis/proctitis.   Review of Systems as per HPI, otherwise negative  Current Medications, Allergies, Past Medical History, Past Surgical History, Family History and Social History were reviewed in [employer] record.     Objective:   Physical Exam BP 132/60 (BP Location: Left Arm, Patient Position: Sitting, Cuff Size: Normal)   Pulse 80   Ht 5' 10.75" (1.797 m) Comment: height measured without shoes  Wt 247 lb 2 oz (112.1 kg)   BMI 34.71 kg/m  Constitutional:  Well-developed and well-nourished. No distress. HEENT: Normocephalic and atraumatic. Oropharynx is clear and moist. Conjunctivae are normal.  No scleral icterus. Neck: Neck supple. Trachea midline. Cardiovascular: Normal rate, regular rhythm and intact distal pulses. No M/R/G Pulmonary/chest: Effort normal and breath sounds normal. No wheezing, rales or rhonchi. Abdominal: Soft, nontender, nondistended. Bowel sounds active throughout. There are no masses palpable. No hepatosplenomegaly. Extremities: no clubbing, cyanosis, or edema Neurological: Alert and oriented to person place and time. Skin: Skin is warm and dry. Psychiatric: Normal mood and affect. Behavior is normal.  CBC    Component Value Date/Time   WBC 5.4 03/14/2017 1457   RBC 4.72 03/14/2017 1457   HGB 15.5 03/14/2017 1457   HCT 47.8 03/14/2017 1457   PLT 191.0 03/14/2017 1457   MCV 101.3 (H) 03/14/2017 1457   MCH 32.4 11/01/2009 0500   MCHC 32.5 03/14/2017 1457   RDW 15.4 03/14/2017 1457   LYMPHSABS 0.6 (L) 03/14/2017 1457   MONOABS 0.7 03/14/2017 1457   EOSABS 0.2 03/14/2017 1457   BASOSABS 0.1 03/14/2017 1457   CMP     Component Value Date/Time   NA 139 03/14/2017 1457   K 3.8 03/14/2017 1457   CL 105 03/14/2017 1457   CO2 29 03/14/2017 1457   GLUCOSE 103 (H) 03/14/2017 1457   BUN 21 03/14/2017 1457   CREATININE 0.89 03/14/2017 1457   CALCIUM 9.3 03/14/2017 1457   PROT 6.8 03/14/2017 1457   ALBUMIN 4.1 03/14/2017 1457   AST 18 03/14/2017 1457  ALT 20 03/14/2017 1457   ALKPHOS 36 (L) 03/14/2017 1457   BILITOT 0.5 03/14/2017 1457   GFRNONAA 70.38 03/23/2010 0847   GFRAA  11/01/2009 0500    >60        The eGFR has been calculated using the MDRD equation. This calculation has not been validated in all clinical situations. eGFR's persistently <60 mL/min signify possible Chronic Kidney Disease.       Assessment & Plan:  71 year old male with ulcerative proctosigmoiditis diagnosed in 2008, history  of adenomatous colon polyps, GERD, sleep apnea and COPD who is here for follow-up.  1. Ulcerative proctosigmoiditis --he is doing extremely well clinically and continues to maintain clinical remission.  He had achieved endoscopic remission at the time of his last surveillance exam 2 years ago.  He is doing well and been on stable therapy now for several years with no flares or need for steroid therapy.  We will continue 6-MP 100 mg daily and sulfasalazine 1 g 3 times daily.  I am hesitant to de-escalate therapy given how well he is doing and how significant this disease was for him in the past.  His liver and blood counts are normal although he has developed a mild macrocytosis.  I expect this is secondary to sulfasalazine and we need to replace folate.  I will place him on folate 1 mg daily indefinitely while on sulfasalazine.  We discussed surveillance colonoscopy.  His disease was limited to the rectum and sigmoid and he was diagnosed 10 years ago.  At this point I think reasonable given last colonoscopy findings to wait 1 year for surveillance which would be at the 3-year mark.  We discussed this and he is agreeable and understands.  We will plan colonoscopy next October unless he should flare sooner.  I will see him back in about 9 months.  25 minutes spent with the patient today. Greater than 50% was spent in counseling and coordination of care with the patient

## 2017-03-15 ENCOUNTER — Other Ambulatory Visit: Payer: Self-pay

## 2017-03-15 DIAGNOSIS — K51519 Left sided colitis with unspecified complications: Secondary | ICD-10-CM

## 2017-03-15 MED ORDER — FOLIC ACID 1 MG PO TABS: 1.0000 mg | ORAL_TABLET | Freq: Every day | ORAL | 11 refills | Status: DC

## 2017-03-18 DIAGNOSIS — G4733 Obstructive sleep apnea (adult) (pediatric): Secondary | ICD-10-CM | POA: Diagnosis not present

## 2017-03-21 ENCOUNTER — Telehealth: Payer: Self-pay | Admitting: Internal Medicine

## 2017-03-21 MED ORDER — FOLIC ACID 1 MG PO TABS: 1.0000 mg | ORAL_TABLET | Freq: Every day | ORAL | 11 refills | Status: DC

## 2017-03-21 NOTE — Telephone Encounter (Signed)
I have spoken to patient to advise of information/recommendations regarding labs 03/15/17 and why he needs to take folic acid while on sulfasalazine. Patient verbalizes understanding. He would like folic acid [facility] to [facility] of [facility] ([facility]). Rx sent to [facility].

## 2017-03-28 ENCOUNTER — Encounter: Payer: Self-pay | Admitting: Internal Medicine

## 2017-03-30 ENCOUNTER — Other Ambulatory Visit: Payer: Self-pay | Admitting: Internal Medicine

## 2017-03-30 DIAGNOSIS — H3561 Retinal hemorrhage, right eye: Secondary | ICD-10-CM | POA: Diagnosis not present

## 2017-03-30 DIAGNOSIS — R911 Solitary pulmonary nodule: Secondary | ICD-10-CM

## 2017-03-31 DIAGNOSIS — Z85828 Personal history of other malignant neoplasm of skin: Secondary | ICD-10-CM | POA: Diagnosis not present

## 2017-03-31 DIAGNOSIS — L820 Inflamed seborrheic keratosis: Secondary | ICD-10-CM | POA: Diagnosis not present

## 2017-03-31 DIAGNOSIS — L821 Other seborrheic keratosis: Secondary | ICD-10-CM | POA: Diagnosis not present

## 2017-03-31 DIAGNOSIS — L570 Actinic keratosis: Secondary | ICD-10-CM | POA: Diagnosis not present

## 2017-03-31 DIAGNOSIS — D485 Neoplasm of uncertain behavior of skin: Secondary | ICD-10-CM | POA: Diagnosis not present

## 2017-03-31 DIAGNOSIS — B078 Other viral warts: Secondary | ICD-10-CM | POA: Diagnosis not present

## 2017-03-31 DIAGNOSIS — H3561 Retinal hemorrhage, right eye: Secondary | ICD-10-CM | POA: Diagnosis not present

## 2017-04-07 ENCOUNTER — Ambulatory Visit
Admission: RE | Admit: 2017-04-07 | Discharge: 2017-04-07 | Disposition: A | Discharge status: Home / Self Care | Payer: Medicare HMO | Source: Ambulatory Visit | Attending: Internal Medicine | Admitting: Internal Medicine

## 2017-04-07 ENCOUNTER — Other Ambulatory Visit: Payer: Self-pay | Admitting: Internal Medicine

## 2017-04-07 DIAGNOSIS — R911 Solitary pulmonary nodule: Secondary | ICD-10-CM

## 2017-04-07 DIAGNOSIS — R918 Other nonspecific abnormal finding of lung field: Secondary | ICD-10-CM | POA: Diagnosis not present

## 2017-04-08 ENCOUNTER — Other Ambulatory Visit: Payer: Self-pay | Admitting: Internal Medicine

## 2017-04-14 DIAGNOSIS — H35051 Retinal neovascularization, unspecified, right eye: Secondary | ICD-10-CM | POA: Diagnosis not present

## 2017-04-17 DIAGNOSIS — G4733 Obstructive sleep apnea (adult) (pediatric): Secondary | ICD-10-CM | POA: Diagnosis not present

## 2017-05-02 ENCOUNTER — Telehealth: Payer: Self-pay | Admitting: Internal Medicine

## 2017-05-02 NOTE — Telephone Encounter (Signed)
Patient notified that according to the 02/2017 office note he is due for a recall colonoscopy for 02/2018 and disregard the recall letter for 02/2017.  New recall entered for 02/2018

## 2017-05-18 DIAGNOSIS — G4733 Obstructive sleep apnea (adult) (pediatric): Secondary | ICD-10-CM | POA: Diagnosis not present

## 2017-05-19 DIAGNOSIS — H353211 Exudative age-related macular degeneration, right eye, with active choroidal neovascularization: Secondary | ICD-10-CM | POA: Diagnosis not present

## 2017-06-06 ENCOUNTER — Telehealth: Payer: Self-pay | Admitting: Internal Medicine

## 2017-06-06 NOTE — Telephone Encounter (Signed)
Pt returned our call about [place] needing him to bring his SD card for CPAP to our office tomorrow when he comes for his appt with CY.  Pt stated about a year ago, he switched to [place] when his insurance changed and the previous machine pt had was making a strange noise and was replaced by [employer] with a new CPAP machine.  Pt states there is no SD card in his machine he has now that he saw but stated he will bring his machine to the office when he comes.  Called [employer] and spoke with Kenney Houseman to see if she could pull up a download from pt's CPAP machine.  Kenney Houseman stated to me pt should be in [place].  Went to see if I could find data from pt's machine and saw where pt was listed in airview twice (once where it shows where he was not compliant and once where it does show the compliance information).  Called pt letting him know we are able to get a download off of pt's cpap machine so there was no need for him to bring his machine to the office tomorrow.  Pt expressed understanding. Nothing further needed at this time.

## 2017-06-07 ENCOUNTER — Encounter: Payer: Self-pay | Admitting: Internal Medicine

## 2017-06-07 ENCOUNTER — Ambulatory Visit: Payer: Medicare HMO | Admitting: Internal Medicine

## 2017-06-07 VITALS — BP 148/80 | HR 85 | Ht 71.0 in | Wt 250.2 lb

## 2017-06-07 DIAGNOSIS — J449 Chronic obstructive pulmonary disease, unspecified: Secondary | ICD-10-CM

## 2017-06-07 DIAGNOSIS — G4733 Obstructive sleep apnea (adult) (pediatric): Secondary | ICD-10-CM | POA: Diagnosis not present

## 2017-06-07 NOTE — Patient Instructions (Signed)
Order- DME Huey Romans- Please replace worn out chin strap, and include each time when supplies are routinely refilled.  Continue CPAP auto 8-20, mask of choice humidifier supplies, Airview   Dx OSA   Please call if we can help

## 2017-06-07 NOTE — Progress Notes (Signed)
HPI male former smoker followed for OSA/insomnia by Dr Annamaria Boots, and at this office/ Dr Lamonte Sakai for COPD GOLD complicated by history of chest trauma, also complicated by ulcerative colitis on immunosuppression, HBP, PAFib, small lung nodules  ( Dr Brigitte Pulse) NPSG 11/21/97- AHI 36/ hr, desaturation to 85%, body weight 215 lbs --------------------------------------------------------------------------------------------  06/07/2016-72 year old male former smoker followed for OSA/insomnia by me, and at this office for COPD GOLD complicated by history of chest trauma, also complicated by ulcerative colitis on immunosuppression, HBP, PAFib, small lung nodules (Dr Brigitte Pulse) CPAP auto 8-20 [facility] LOV this office in December for exacerbation COPD 1 year f/u for CPAP. Breathing has been the same since last visit.  Dr. Brigitte Pulse is following small lung nodules with CT CPAP download confirms 100% 4 hour compliance with excellent control AHI 1.3/hour. He wakes up sometimes at night but goes right back to sleep and feels well rested in the daytime. Wife does not tell him he snores through CPAP. DME was changed to [place] because of insurance coverage CXR 04/23/2016- IMPRESSION: No acute cardiopulmonary disease.  06/06/17- 72 year old male former smoker followed for OSA/insomnia by me, and at this office for COPD GOLD complicated by history of chest trauma( Dr Lamonte Sakai), also complicated by ulcerative colitis on immunosuppression, HBP, PAFib, small lung nodules (Dr Brigitte Pulse) CPAP auto 8-20/[facility] ---OSA; DME: [facility]. Pt wears CPAP nighlty and DL attached. Pt will need order for new supplies today.  He likes his Dreamware CPAP mask but needs refill on the chinstrap.  Pressure is comfortable. Download 100% compliance, AHI 1.1/hour.  Says he "cannot live without it"-cannot sleep without CPAP. Low-dose CT screening follow-up in December-benign pattern.  Also noted aortic atherosclerosis and emphysema.  ROS-see HPI   + = pos Constitutional:    No-   weight loss, night sweats, fevers, chills, fatigue, lassitude. HEENT:   No-  headaches, difficulty swallowing, tooth/dental problems, sore throat,       No-  sneezing, itching, ear ache, nasal congestion, post nasal drip,  CV:  No-   chest pain, orthopnea, PND, swelling in lower extremities, anasarca,                                                           dizziness, palpitations Resp: + shortness of breath with exertion or at rest.              productive cough,  No non-productive cough,  No- coughing up of blood.                change in color of mucus.  No- wheezing.   Skin: No-   rash or lesions. GI:  No-   heartburn, indigestion, GU: . MS:  No-   joint pain or swelling.  . Neuro-     nothing unusual Psych:  No- change in mood or affect. No depression or anxiety.  No memory loss.  OBJ- Physical Exam General- Alert, Oriented, Affect-appropriate, Distress- none acute. Overweight,  Skin- rash-none, lesions- none, excoriation- none Lymphadenopathy- none Head- atraumatic            Eyes- Gross vision intact, PERRLA, conjunctivae and secretions clear            Ears- Hearing, canals-normal            Nose- Clear, no-Septal dev, mucus, polyps,  erosion, perforation             Throat- Mallampati IV , mucosa clear , drainage- none, tonsils- atrophic Neck- flexible , trachea midline, no stridor , thyroid nl, carotid no bruit Chest - symmetrical excursion , unlabored           Heart/CV- RRR, no murmur , no gallop  , no rub, nl s1 s2                           - JVD- none , edema- none, stasis changes- none, varices- none           Lung- + diminished/ coarse, wheeze -none, cough-none , dullness-none, rub- none           Chest wall-  Abd-  Br/ Gen/ Rectal- Not done, not indicated Extrem- cyanosis- none, clubbing, none, atrophy- none, strength- nl Neuro- grossly intact to observation

## 2017-06-17 ENCOUNTER — Telehealth: Payer: Self-pay | Admitting: Internal Medicine

## 2017-06-17 NOTE — Telephone Encounter (Signed)
Spoke with pt, advised pt to call [facility] because the the order was placed. He states he received a phone call from them but they didn't call back. Advised to call back if needed.

## 2017-06-19 DIAGNOSIS — G4733 Obstructive sleep apnea (adult) (pediatric): Secondary | ICD-10-CM | POA: Diagnosis not present

## 2017-06-22 ENCOUNTER — Other Ambulatory Visit: Payer: Self-pay | Admitting: Emergency Medicine

## 2017-06-23 DIAGNOSIS — H353211 Exudative age-related macular degeneration, right eye, with active choroidal neovascularization: Secondary | ICD-10-CM | POA: Diagnosis not present

## 2017-06-26 NOTE — Assessment & Plan Note (Signed)
Download confirms his report of good compliance and control.  He benefits from CPAP-sleeps better.  Needs replacement chinstrap.  Continuing auto 8-20.

## 2017-06-26 NOTE — Assessment & Plan Note (Signed)
No acute exacerbation.  He feels controlled.

## 2017-07-20 DIAGNOSIS — L820 Inflamed seborrheic keratosis: Secondary | ICD-10-CM | POA: Diagnosis not present

## 2017-07-20 DIAGNOSIS — L570 Actinic keratosis: Secondary | ICD-10-CM | POA: Diagnosis not present

## 2017-07-20 DIAGNOSIS — D485 Neoplasm of uncertain behavior of skin: Secondary | ICD-10-CM | POA: Diagnosis not present

## 2017-07-20 DIAGNOSIS — D692 Other nonthrombocytopenic purpura: Secondary | ICD-10-CM | POA: Diagnosis not present

## 2017-07-20 DIAGNOSIS — L821 Other seborrheic keratosis: Secondary | ICD-10-CM | POA: Diagnosis not present

## 2017-07-20 DIAGNOSIS — D044 Carcinoma in situ of skin of scalp and neck: Secondary | ICD-10-CM | POA: Diagnosis not present

## 2017-07-20 DIAGNOSIS — Z85828 Personal history of other malignant neoplasm of skin: Secondary | ICD-10-CM | POA: Diagnosis not present

## 2017-07-28 DIAGNOSIS — D044 Carcinoma in situ of skin of scalp and neck: Secondary | ICD-10-CM | POA: Diagnosis not present

## 2017-07-28 DIAGNOSIS — Z85828 Personal history of other malignant neoplasm of skin: Secondary | ICD-10-CM | POA: Diagnosis not present

## 2017-08-11 DIAGNOSIS — H353211 Exudative age-related macular degeneration, right eye, with active choroidal neovascularization: Secondary | ICD-10-CM | POA: Diagnosis not present

## 2017-09-05 ENCOUNTER — Encounter: Payer: Self-pay | Admitting: Emergency Medicine

## 2017-09-05 ENCOUNTER — Ambulatory Visit: Payer: Medicare HMO | Admitting: Emergency Medicine

## 2017-09-05 DIAGNOSIS — G4733 Obstructive sleep apnea (adult) (pediatric): Secondary | ICD-10-CM

## 2017-09-05 DIAGNOSIS — J301 Allergic rhinitis due to pollen: Secondary | ICD-10-CM

## 2017-09-05 DIAGNOSIS — J449 Chronic obstructive pulmonary disease, unspecified: Secondary | ICD-10-CM

## 2017-09-05 NOTE — Assessment & Plan Note (Signed)
Continue loratadine as orderd

## 2017-09-05 NOTE — Assessment & Plan Note (Signed)
No exacerbations in the last year which is a significant improvement.  Minimal daily symptoms.  Minimal albuterol use.  I think he is doing well.  Continue Trelegy as ordered.  Albuterol as needed.

## 2017-09-05 NOTE — Assessment & Plan Note (Signed)
Doing well with his CPAP.  He follows with Dr. Annamaria Boots.  His company is [employer].

## 2017-09-05 NOTE — Patient Instructions (Signed)
Please continue trelegy once daily as you have been taking it.  Remember to rinse and gargle after use this medication. Keep albuterol available to use 2 puffs if needed for chest tightness, shortness of breath, wheezing. Continue usual CPAP every night and follow with Dr. Annamaria Boots as planned. Follow with Dr Lamonte Sakai in 12 months or sooner if you have any problems
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
## 2017-09-06 ENCOUNTER — Other Ambulatory Visit: Payer: Self-pay

## 2017-09-08 ENCOUNTER — Other Ambulatory Visit: Payer: Self-pay | Admitting: Cardiology

## 2017-09-14 ENCOUNTER — Other Ambulatory Visit (INDEPENDENT_AMBULATORY_CARE_PROVIDER_SITE_OTHER): Payer: Medicare HMO

## 2017-09-14 DIAGNOSIS — K51519 Left sided colitis with unspecified complications: Secondary | ICD-10-CM | POA: Diagnosis not present

## 2017-09-14 LAB — CBC WITH DIFFERENTIAL/PLATELET
BASOS ABS: 0.1 10*3/uL (ref 0.0–0.1)
Basophils Relative: 1.2 % (ref 0.0–3.0)
EOS ABS: 0.2 10*3/uL (ref 0.0–0.7)
EOS PCT: 4.3 % (ref 0.0–5.0)
HCT: 43.8 % (ref 39.0–52.0)
Hemoglobin: 14.7 g/dL (ref 13.0–17.0)
Lymphocytes Relative: 12.2 % (ref 12.0–46.0)
Lymphs Abs: 0.6 10*3/uL — ABNORMAL LOW (ref 0.7–4.0)
MCHC: 33.4 g/dL (ref 30.0–36.0)
MCV: 99.3 fl (ref 78.0–100.0)
MONO ABS: 0.7 10*3/uL (ref 0.1–1.0)
Monocytes Relative: 14.5 % — ABNORMAL HIGH (ref 3.0–12.0)
Neutro Abs: 3.1 10*3/uL (ref 1.4–7.7)
Neutrophils Relative %: 67.8 % (ref 43.0–77.0)
Platelets: 167 10*3/uL (ref 150.0–400.0)
RBC: 4.41 Mil/uL (ref 4.22–5.81)
RDW: 15 % (ref 11.5–15.5)
WBC: 4.5 10*3/uL (ref 4.0–10.5)

## 2017-09-14 LAB — COMPREHENSIVE METABOLIC PANEL
ALBUMIN: 3.9 g/dL (ref 3.5–5.2)
ALK PHOS: 37 U/L — AB (ref 39–117)
ALT: 22 U/L (ref 0–53)
AST: 14 U/L (ref 0–37)
BUN: 24 mg/dL — AB (ref 6–23)
CO2: 30 mEq/L (ref 19–32)
Calcium: 9.1 mg/dL (ref 8.4–10.5)
Chloride: 105 mEq/L (ref 96–112)
Creatinine, Ser: 0.88 mg/dL (ref 0.40–1.50)
GFR: 90.87 mL/min (ref >60.00)
Glucose, Bld: 94 mg/dL (ref 70–99)
POTASSIUM: 3.8 meq/L (ref 3.5–5.1)
SODIUM: 141 meq/L (ref 135–145)
TOTAL PROTEIN: 6.6 g/dL (ref 6.0–8.3)
Total Bilirubin: 0.5 mg/dL (ref 0.2–1.2)

## 2017-09-15 ENCOUNTER — Other Ambulatory Visit (HOSPITAL_COMMUNITY): Payer: Self-pay | Admitting: Internal Medicine

## 2017-09-25 ENCOUNTER — Other Ambulatory Visit: Payer: Self-pay | Admitting: Internal Medicine

## 2017-10-06 DIAGNOSIS — H353212 Exudative age-related macular degeneration, right eye, with inactive choroidal neovascularization: Secondary | ICD-10-CM | POA: Diagnosis not present

## 2017-10-12 DIAGNOSIS — H52203 Unspecified astigmatism, bilateral: Secondary | ICD-10-CM | POA: Diagnosis not present

## 2017-10-12 DIAGNOSIS — H353212 Exudative age-related macular degeneration, right eye, with inactive choroidal neovascularization: Secondary | ICD-10-CM | POA: Diagnosis not present

## 2017-10-12 DIAGNOSIS — H2513 Age-related nuclear cataract, bilateral: Secondary | ICD-10-CM | POA: Diagnosis not present

## 2017-10-14 DIAGNOSIS — H2513 Age-related nuclear cataract, bilateral: Secondary | ICD-10-CM | POA: Diagnosis not present

## 2017-10-14 DIAGNOSIS — H353212 Exudative age-related macular degeneration, right eye, with inactive choroidal neovascularization: Secondary | ICD-10-CM | POA: Diagnosis not present

## 2017-10-18 DIAGNOSIS — Z6833 Body mass index (BMI) 33.0-33.9, adult: Secondary | ICD-10-CM | POA: Diagnosis not present

## 2017-10-18 DIAGNOSIS — N529 Male erectile dysfunction, unspecified: Secondary | ICD-10-CM | POA: Diagnosis not present

## 2017-10-18 DIAGNOSIS — I10 Essential (primary) hypertension: Secondary | ICD-10-CM | POA: Diagnosis not present

## 2017-10-18 DIAGNOSIS — G4730 Sleep apnea, unspecified: Secondary | ICD-10-CM | POA: Diagnosis not present

## 2017-10-18 DIAGNOSIS — R69 Illness, unspecified: Secondary | ICD-10-CM | POA: Diagnosis not present

## 2017-10-18 DIAGNOSIS — E785 Hyperlipidemia, unspecified: Secondary | ICD-10-CM | POA: Diagnosis not present

## 2017-10-18 DIAGNOSIS — Z809 Family history of malignant neoplasm, unspecified: Secondary | ICD-10-CM | POA: Diagnosis not present

## 2017-10-18 DIAGNOSIS — E669 Obesity, unspecified: Secondary | ICD-10-CM | POA: Diagnosis not present

## 2017-10-18 DIAGNOSIS — I4891 Unspecified atrial fibrillation: Secondary | ICD-10-CM | POA: Diagnosis not present

## 2017-10-18 DIAGNOSIS — K5190 Ulcerative colitis, unspecified, without complications: Secondary | ICD-10-CM | POA: Diagnosis not present

## 2017-11-04 ENCOUNTER — Telehealth: Payer: Self-pay | Admitting: Emergency Medicine

## 2017-11-04 ENCOUNTER — Other Ambulatory Visit: Payer: Self-pay | Admitting: Emergency Medicine

## 2017-11-04 MED ORDER — FLUTICASONE-UMECLIDIN-VILANT 100-62.5-25 MCG/INH IN AEPB: 1.0000 | INHALATION_SPRAY | Freq: Every day | RESPIRATORY_TRACT | 5 refills | Status: DC

## 2017-11-04 NOTE — Telephone Encounter (Signed)
Spoke with pt. He is requesting a prescription for Trelegy. Rx has been sent in. Nothing further was needed.

## 2017-11-10 ENCOUNTER — Other Ambulatory Visit (HOSPITAL_COMMUNITY): Payer: Self-pay | Admitting: Internal Medicine

## 2017-11-18 DIAGNOSIS — H353212 Exudative age-related macular degeneration, right eye, with inactive choroidal neovascularization: Secondary | ICD-10-CM | POA: Diagnosis not present

## 2017-12-02 DIAGNOSIS — L570 Actinic keratosis: Secondary | ICD-10-CM | POA: Diagnosis not present

## 2017-12-02 DIAGNOSIS — D1801 Hemangioma of skin and subcutaneous tissue: Secondary | ICD-10-CM | POA: Diagnosis not present

## 2017-12-02 DIAGNOSIS — D225 Melanocytic nevi of trunk: Secondary | ICD-10-CM | POA: Diagnosis not present

## 2017-12-02 DIAGNOSIS — L814 Other melanin hyperpigmentation: Secondary | ICD-10-CM | POA: Diagnosis not present

## 2017-12-02 DIAGNOSIS — D485 Neoplasm of uncertain behavior of skin: Secondary | ICD-10-CM | POA: Diagnosis not present

## 2017-12-02 DIAGNOSIS — C44519 Basal cell carcinoma of skin of other part of trunk: Secondary | ICD-10-CM | POA: Diagnosis not present

## 2017-12-02 DIAGNOSIS — Z85828 Personal history of other malignant neoplasm of skin: Secondary | ICD-10-CM | POA: Diagnosis not present

## 2017-12-02 DIAGNOSIS — D044 Carcinoma in situ of skin of scalp and neck: Secondary | ICD-10-CM | POA: Diagnosis not present

## 2017-12-02 DIAGNOSIS — C4442 Squamous cell carcinoma of skin of scalp and neck: Secondary | ICD-10-CM | POA: Diagnosis not present

## 2017-12-02 DIAGNOSIS — D0461 Carcinoma in situ of skin of right upper limb, including shoulder: Secondary | ICD-10-CM | POA: Diagnosis not present

## 2017-12-02 DIAGNOSIS — D692 Other nonthrombocytopenic purpura: Secondary | ICD-10-CM | POA: Diagnosis not present

## 2017-12-02 DIAGNOSIS — L918 Other hypertrophic disorders of the skin: Secondary | ICD-10-CM | POA: Diagnosis not present

## 2017-12-02 DIAGNOSIS — L821 Other seborrheic keratosis: Secondary | ICD-10-CM | POA: Diagnosis not present

## 2017-12-15 DIAGNOSIS — H353212 Exudative age-related macular degeneration, right eye, with inactive choroidal neovascularization: Secondary | ICD-10-CM | POA: Diagnosis not present

## 2017-12-17 ENCOUNTER — Other Ambulatory Visit: Payer: Self-pay | Admitting: Internal Medicine

## 2017-12-19 DIAGNOSIS — G4733 Obstructive sleep apnea (adult) (pediatric): Secondary | ICD-10-CM | POA: Diagnosis not present

## 2017-12-21 DIAGNOSIS — Z125 Encounter for screening for malignant neoplasm of prostate: Secondary | ICD-10-CM | POA: Diagnosis not present

## 2017-12-21 DIAGNOSIS — R7301 Impaired fasting glucose: Secondary | ICD-10-CM | POA: Diagnosis not present

## 2017-12-21 DIAGNOSIS — I10 Essential (primary) hypertension: Secondary | ICD-10-CM | POA: Diagnosis not present

## 2017-12-21 DIAGNOSIS — R82998 Other abnormal findings in urine: Secondary | ICD-10-CM | POA: Diagnosis not present

## 2017-12-21 DIAGNOSIS — E7849 Other hyperlipidemia: Secondary | ICD-10-CM | POA: Diagnosis not present

## 2017-12-23 DIAGNOSIS — Z1212 Encounter for screening for malignant neoplasm of rectum: Secondary | ICD-10-CM | POA: Diagnosis not present

## 2017-12-23 LAB — IFOBT (OCCULT BLOOD): IMMUNOLOGICAL FECAL OCCULT BLOOD TEST: NEGATIVE

## 2017-12-28 DIAGNOSIS — G4733 Obstructive sleep apnea (adult) (pediatric): Secondary | ICD-10-CM | POA: Diagnosis not present

## 2017-12-28 DIAGNOSIS — R7301 Impaired fasting glucose: Secondary | ICD-10-CM | POA: Diagnosis not present

## 2017-12-28 DIAGNOSIS — I10 Essential (primary) hypertension: Secondary | ICD-10-CM | POA: Diagnosis not present

## 2017-12-28 DIAGNOSIS — R208 Other disturbances of skin sensation: Secondary | ICD-10-CM | POA: Diagnosis not present

## 2017-12-28 DIAGNOSIS — Z6833 Body mass index (BMI) 33.0-33.9, adult: Secondary | ICD-10-CM | POA: Diagnosis not present

## 2017-12-28 DIAGNOSIS — K5190 Ulcerative colitis, unspecified, without complications: Secondary | ICD-10-CM | POA: Diagnosis not present

## 2017-12-28 DIAGNOSIS — Z0000 Encounter for general adult medical examination without abnormal findings: Secondary | ICD-10-CM | POA: Diagnosis not present

## 2017-12-28 DIAGNOSIS — E8881 Metabolic syndrome: Secondary | ICD-10-CM | POA: Diagnosis not present

## 2017-12-28 DIAGNOSIS — J449 Chronic obstructive pulmonary disease, unspecified: Secondary | ICD-10-CM | POA: Diagnosis not present

## 2017-12-28 DIAGNOSIS — E7849 Other hyperlipidemia: Secondary | ICD-10-CM | POA: Diagnosis not present

## 2018-01-13 ENCOUNTER — Telehealth: Payer: Self-pay | Admitting: Internal Medicine

## 2018-01-13 MED ORDER — SULFASALAZINE 500 MG PO TBEC: DELAYED_RELEASE_TABLET | ORAL | 0 refills | Status: DC

## 2018-01-13 NOTE — Telephone Encounter (Signed)
Rx sent 

## 2018-01-18 DIAGNOSIS — H353212 Exudative age-related macular degeneration, right eye, with inactive choroidal neovascularization: Secondary | ICD-10-CM | POA: Diagnosis not present

## 2018-01-18 DIAGNOSIS — H2513 Age-related nuclear cataract, bilateral: Secondary | ICD-10-CM | POA: Diagnosis not present

## 2018-01-26 DIAGNOSIS — J449 Chronic obstructive pulmonary disease, unspecified: Secondary | ICD-10-CM | POA: Diagnosis not present

## 2018-01-26 DIAGNOSIS — R05 Cough: Secondary | ICD-10-CM | POA: Diagnosis not present

## 2018-01-26 DIAGNOSIS — J209 Acute bronchitis, unspecified: Secondary | ICD-10-CM | POA: Diagnosis not present

## 2018-01-26 DIAGNOSIS — Z6833 Body mass index (BMI) 33.0-33.9, adult: Secondary | ICD-10-CM | POA: Diagnosis not present

## 2018-02-06 ENCOUNTER — Other Ambulatory Visit: Payer: Self-pay | Admitting: Internal Medicine

## 2018-02-07 ENCOUNTER — Other Ambulatory Visit: Payer: Self-pay | Admitting: Internal Medicine

## 2018-02-08 DIAGNOSIS — J441 Chronic obstructive pulmonary disease with (acute) exacerbation: Secondary | ICD-10-CM | POA: Diagnosis not present

## 2018-02-08 DIAGNOSIS — J209 Acute bronchitis, unspecified: Secondary | ICD-10-CM | POA: Diagnosis not present

## 2018-02-08 DIAGNOSIS — Z6833 Body mass index (BMI) 33.0-33.9, adult: Secondary | ICD-10-CM | POA: Diagnosis not present

## 2018-02-08 DIAGNOSIS — H6121 Impacted cerumen, right ear: Secondary | ICD-10-CM | POA: Diagnosis not present

## 2018-02-09 ENCOUNTER — Other Ambulatory Visit: Payer: Self-pay

## 2018-02-09 ENCOUNTER — Encounter (HOSPITAL_COMMUNITY): Payer: Self-pay

## 2018-02-09 ENCOUNTER — Ambulatory Visit (HOSPITAL_COMMUNITY)
Admission: RE | Admit: 2018-02-09 | Discharge: 2018-02-09 | Disposition: A | Discharge status: Home / Self Care | Payer: Medicare HMO | Source: Ambulatory Visit | Attending: Cardiology | Admitting: Cardiology

## 2018-02-09 VITALS — BP 140/72 | HR 81 | Wt 243.5 lb

## 2018-02-09 DIAGNOSIS — I480 Paroxysmal atrial fibrillation: Secondary | ICD-10-CM | POA: Insufficient documentation

## 2018-02-09 DIAGNOSIS — E785 Hyperlipidemia, unspecified: Secondary | ICD-10-CM | POA: Diagnosis not present

## 2018-02-09 DIAGNOSIS — J449 Chronic obstructive pulmonary disease, unspecified: Secondary | ICD-10-CM | POA: Diagnosis not present

## 2018-02-09 DIAGNOSIS — R911 Solitary pulmonary nodule: Secondary | ICD-10-CM | POA: Diagnosis not present

## 2018-02-09 DIAGNOSIS — I4891 Unspecified atrial fibrillation: Secondary | ICD-10-CM

## 2018-02-09 DIAGNOSIS — I10 Essential (primary) hypertension: Secondary | ICD-10-CM | POA: Diagnosis not present

## 2018-02-09 DIAGNOSIS — G4733 Obstructive sleep apnea (adult) (pediatric): Secondary | ICD-10-CM | POA: Insufficient documentation

## 2018-02-09 DIAGNOSIS — Z79899 Other long term (current) drug therapy: Secondary | ICD-10-CM | POA: Insufficient documentation
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
## 2018-02-09 NOTE — Patient Instructions (Signed)
It was great to see you today! No medication changes are needed at this time.     Your physician recommends that you schedule a follow-up appointment in: 12 months with Dr Haroldine Laws   Do the following things EVERYDAY: 1) Weigh yourself in the morning before breakfast. Write it down and keep it in a log. 2) Take your medicines as prescribed 3) Eat low salt foods-Limit salt (sodium) to 2000 mg per day.  4) Stay as active as you can everyday 5) Limit all fluids for the day to less than 2 liters

## 2018-02-17 ENCOUNTER — Other Ambulatory Visit (HOSPITAL_COMMUNITY): Payer: Self-pay

## 2018-02-17 ENCOUNTER — Other Ambulatory Visit (HOSPITAL_COMMUNITY): Payer: Self-pay | Admitting: *Deleted

## 2018-02-17 MED ORDER — LOSARTAN POTASSIUM-HCTZ 100-25 MG PO TABS: 0.5000 | ORAL_TABLET | Freq: Every day | ORAL | 3 refills | Status: DC

## 2018-02-24 ENCOUNTER — Encounter: Payer: Self-pay | Admitting: Internal Medicine

## 2018-04-05 ENCOUNTER — Other Ambulatory Visit: Payer: Self-pay | Admitting: Internal Medicine

## 2018-04-05 DIAGNOSIS — L570 Actinic keratosis: Secondary | ICD-10-CM | POA: Diagnosis not present

## 2018-04-05 DIAGNOSIS — L820 Inflamed seborrheic keratosis: Secondary | ICD-10-CM | POA: Diagnosis not present

## 2018-04-05 DIAGNOSIS — D485 Neoplasm of uncertain behavior of skin: Secondary | ICD-10-CM | POA: Diagnosis not present

## 2018-04-05 DIAGNOSIS — D234 Other benign neoplasm of skin of scalp and neck: Secondary | ICD-10-CM | POA: Diagnosis not present

## 2018-04-05 DIAGNOSIS — Z85828 Personal history of other malignant neoplasm of skin: Secondary | ICD-10-CM | POA: Diagnosis not present

## 2018-04-05 DIAGNOSIS — F17201 Nicotine dependence, unspecified, in remission: Secondary | ICD-10-CM

## 2018-04-10 ENCOUNTER — Ambulatory Visit: Payer: Medicare HMO | Admitting: Internal Medicine

## 2018-04-10 ENCOUNTER — Encounter: Payer: Self-pay | Admitting: Internal Medicine

## 2018-04-10 VITALS — BP 120/70 | HR 82 | Ht 71.0 in | Wt 247.4 lb

## 2018-04-10 DIAGNOSIS — Z79899 Other long term (current) drug therapy: Secondary | ICD-10-CM | POA: Diagnosis not present

## 2018-04-10 DIAGNOSIS — K5150 Left sided colitis without complications: Secondary | ICD-10-CM | POA: Diagnosis not present

## 2018-04-10 MED ORDER — MERCAPTOPURINE 50 MG PO TABS: ORAL_TABLET | ORAL | 1 refills | Status: DC

## 2018-04-10 MED ORDER — FOLIC ACID 1 MG PO TABS: 1.0000 mg | ORAL_TABLET | Freq: Every day | ORAL | 2 refills | Status: DC

## 2018-04-10 MED ORDER — SUPREP BOWEL PREP KIT 17.5-3.13-1.6 GM/177ML PO SOLN: 1.0000 | ORAL | 0 refills | Status: DC

## 2018-04-10 MED ORDER — SULFASALAZINE 500 MG PO TBEC: DELAYED_RELEASE_TABLET | ORAL | 1 refills | Status: DC

## 2018-04-10 NOTE — Patient Instructions (Signed)
You have been scheduled for a colonoscopy. Please follow written instructions given to you at your visit today.  Please pick up your prep supplies at the pharmacy within the next 1-3 days. If you use inhalers (even only as needed), please bring them with you on the day of your procedure. Your physician has requested that you go to www.[email or web address] and enter the access code given to you at your visit today. This web site gives a general overview about your procedure. However, you should still follow specific instructions given to you by our office regarding your preparation for the procedure.  We have sent the following medications to your pharmacy for you to pick up at your convenience: Sulfasalazine Folate Mercaptopurine  If you are age 61 or older, your body mass index should be between 23-30. Your Body mass index is 34.51 kg/m. If this is out of the aforementioned range listed, please consider follow up with your Primary Care Provider.  If you are age 71 or younger, your body mass index should be between 19-25. Your Body mass index is 34.51 kg/m. If this is out of the aformentioned range listed, please consider follow up with your Primary Care Provider.

## 2018-04-10 NOTE — Progress Notes (Signed)
Subjective:    Patient ID: Ricardo Cooper, male    DOB: 10-Feb-1946, 72 y.o.   MRN: [id number]  HPI Ricardo Cooper is a 72 year old male with a history of ulcerative proctosigmoiditis diagnosed in 2008, history of adenomatous colon polyps, GERD, sleep apnea and COPD who is here for follow-up.  He was last seen on 03/14/2017.  He is here alone today.  He has been maintained on 6-MP 100 mg daily, sulfasalazine 1 g 2-3 times a day and folic acid.  He reports he is feeling great from a GI perspective.  Occasionally his stools will become too hard and he will use Colace or MiraLAX with success.  No bleeding thus no blood in his stool or melena.  Bowel movements occur roughly once per day.  No abdominal pain.  No tenesmus.  No upper GI or hepatobiliary complaint.  Last colonoscopy was just over 3 years ago with mild erythema in the rectum and sigmoid with mild to moderate diverticulosis in the left colon.  Surveillance biopsies showed chronic inactive proctocolitis without dysplasia.   Review of Systems As per HPI, otherwise negative  Current Medications, Allergies, Past Medical History, Past Surgical History, Family History and Social History were reviewed in [employer] record.     Objective:   Physical Exam BP 120/70   Pulse 82   Ht _0  (1.803 m)   Wt 247 lb 6.4 oz (112.2 kg)   SpO2 96%   BMI 34.51 kg/m  Constitutional: Well-developed and well-nourished. No distress. HEENT: Normocephalic and atraumatic.  Conjunctivae are normal.  No scleral icterus. Neck: Neck supple. Trachea midline. Cardiovascular: Normal rate, regular rhythm and intact distal pulses. No M/R/G Pulmonary/chest: Effort normal and breath sounds normal. No wheezing, rales or rhonchi. Abdominal: Soft, nontender, nondistended. Bowel sounds active throughout. There are no masses palpable. No hepatosplenomegaly. Extremities: no clubbing, cyanosis, or edema Neurological: Alert and oriented to  person place and time. Skin: Skin is warm and dry. Psychiatric: Normal mood and affect. Behavior is normal.  FOBT testing by primary care negative in August 2019  CBC    Component Value Date/Time   WBC 4.5 09/14/2017 1141   RBC 4.41 09/14/2017 1141   HGB 14.7 09/14/2017 1141   HCT 43.8 09/14/2017 1141   PLT 167.0 09/14/2017 1141   MCV 99.3 09/14/2017 1141   MCH 32.4 11/01/2009 0500   MCHC 33.4 09/14/2017 1141   RDW 15.0 09/14/2017 1141   LYMPHSABS 0.6 (L) 09/14/2017 1141   MONOABS 0.7 09/14/2017 1141   EOSABS 0.2 09/14/2017 1141   BASOSABS 0.1 09/14/2017 1141   CMP     Component Value Date/Time   NA 141 09/14/2017 1141   K 3.8 09/14/2017 1141   CL 105 09/14/2017 1141   CO2 30 09/14/2017 1141   GLUCOSE 94 09/14/2017 1141   BUN 24 (H) 09/14/2017 1141   CREATININE 0.88 09/14/2017 1141   CALCIUM 9.1 09/14/2017 1141   PROT 6.6 09/14/2017 1141   ALBUMIN 3.9 09/14/2017 1141   AST 14 09/14/2017 1141   ALT 22 09/14/2017 1141   ALKPHOS 37 (L) 09/14/2017 1141   BILITOT 0.5 09/14/2017 1141   GFRNONAA 70.38 03/23/2010 0847   GFRAA  11/01/2009 0500    >60        The eGFR has been calculated using the MDRD equation. This calculation has not been validated in all clinical situations. eGFR's persistently <60 mL/min signify possible Chronic Kidney Disease.     Assessment & Plan:  72 year old male with a history of ulcerative proctosigmoiditis diagnosed in 2008, history of adenomatous colon polyps, GERD, sleep apnea and COPD who is here for follow-up  1.  Ulcerative proctosigmoiditis, diagnosis 2008 --he is doing remarkably well on immunomodulator therapy and sulfasalazine.  He has maintained clinical remission now for several years.  Liver enzymes and blood counts are normal on current dose of 6-MP --Continue 6-MP 100 mg daily --Continue sulfasalazine 1 g 3 times daily --Continue folic acid supplementation --Surveillance colonoscopy indicated at this time.  We discussed the  risk, benefits and alternatives and he is agreeable and wishes to proceed. --Annual flu vaccine up-to-date  25 minutes spent with the patient today. Greater than 50% was spent in counseling and coordination of care with the patient

## 2018-04-13 ENCOUNTER — Ambulatory Visit
Admission: RE | Admit: 2018-04-13 | Discharge: 2018-04-13 | Disposition: A | Discharge status: Home / Self Care | Payer: Medicare HMO | Source: Ambulatory Visit | Attending: Internal Medicine | Admitting: Internal Medicine

## 2018-04-13 DIAGNOSIS — Z87891 Personal history of nicotine dependence: Secondary | ICD-10-CM | POA: Diagnosis not present

## 2018-04-13 DIAGNOSIS — F17201 Nicotine dependence, unspecified, in remission: Secondary | ICD-10-CM

## 2018-04-17 DIAGNOSIS — J209 Acute bronchitis, unspecified: Secondary | ICD-10-CM | POA: Diagnosis not present

## 2018-04-17 DIAGNOSIS — J984 Other disorders of lung: Secondary | ICD-10-CM | POA: Diagnosis not present

## 2018-04-17 DIAGNOSIS — Z6833 Body mass index (BMI) 33.0-33.9, adult: Secondary | ICD-10-CM | POA: Diagnosis not present

## 2018-04-17 DIAGNOSIS — J111 Influenza due to unidentified influenza virus with other respiratory manifestations: Secondary | ICD-10-CM | POA: Diagnosis not present

## 2018-04-24 ENCOUNTER — Encounter: Payer: Self-pay | Admitting: Internal Medicine

## 2018-05-03 DIAGNOSIS — H353212 Exudative age-related macular degeneration, right eye, with inactive choroidal neovascularization: Secondary | ICD-10-CM | POA: Diagnosis not present

## 2018-05-08 ENCOUNTER — Ambulatory Visit (AMBULATORY_SURGERY_CENTER): Payer: Medicare HMO | Admitting: Internal Medicine

## 2018-05-08 ENCOUNTER — Encounter: Payer: Self-pay | Admitting: Internal Medicine

## 2018-05-08 VITALS — BP 130/70 | HR 70 | Temp 97.8°F | Resp 14 | Ht 71.0 in | Wt 247.0 lb

## 2018-05-08 DIAGNOSIS — D129 Benign neoplasm of anus and anal canal: Secondary | ICD-10-CM

## 2018-05-08 DIAGNOSIS — K621 Rectal polyp: Secondary | ICD-10-CM

## 2018-05-08 DIAGNOSIS — Z86010 Personal history of colonic polyps: Secondary | ICD-10-CM

## 2018-05-08 DIAGNOSIS — K635 Polyp of colon: Secondary | ICD-10-CM

## 2018-05-08 DIAGNOSIS — D123 Benign neoplasm of transverse colon: Secondary | ICD-10-CM

## 2018-05-08 DIAGNOSIS — D128 Benign neoplasm of rectum: Secondary | ICD-10-CM

## 2018-05-08 DIAGNOSIS — K5190 Ulcerative colitis, unspecified, without complications: Secondary | ICD-10-CM | POA: Diagnosis not present

## 2018-05-08 DIAGNOSIS — K5180 Other ulcerative colitis without complications: Secondary | ICD-10-CM

## 2018-05-08 DIAGNOSIS — D120 Benign neoplasm of cecum: Secondary | ICD-10-CM | POA: Diagnosis not present

## 2018-05-08 DIAGNOSIS — K5150 Left sided colitis without complications: Secondary | ICD-10-CM

## 2018-05-08 DIAGNOSIS — D125 Benign neoplasm of sigmoid colon: Secondary | ICD-10-CM

## 2018-05-08 MED ORDER — SODIUM CHLORIDE 0.9 % IV SOLN: 500.0000 mL | Freq: Once | INTRAVENOUS | Status: DC

## 2018-05-08 NOTE — Op Note (Signed)
[facility] Patient Name: Cyree Chuong Procedure Date: 05/08/2018 2:06 PM MRN: [id number] Endoscopist: Jerene Bears , MD Age: 73 Referring MD:  Date of Birth: 1945-05-30 Gender: Male Account #: [id number] Procedure:                Colonoscopy Indications:              High risk colon cancer surveillance: Personal                            history of non-advanced adenoma, High risk colon                            cancer surveillance: Ulcerative proctosigmoiditis,                            Last colonoscopy 3 years ago Medicines:                Monitored Anesthesia Care Procedure:                Pre-Anesthesia Assessment:                           - Prior to the procedure, a History and Physical                            was performed, and patient medications and                            allergies were reviewed. The patient's tolerance of                            previous anesthesia was also reviewed. The risks                            and benefits of the procedure and the sedation                            options and risks were discussed with the patient.                            All questions were answered, and informed consent                            was obtained. Prior Anticoagulants: The patient has                            taken no previous anticoagulant or antiplatelet                            agents. ASA Grade Assessment: III - A patient with                            severe systemic disease. After reviewing the risks  and benefits, the patient was deemed in                            satisfactory condition to undergo the procedure.                           After obtaining informed consent, the colonoscope                            was passed under direct vision. Throughout the                            procedure, the patient's blood pressure, pulse, and                            oxygen saturations were monitored  continuously. The                            Colonoscope was introduced through the anus and                            advanced to the cecum, identified by appendiceal                            orifice and ileocecal valve. The colonoscopy was                            performed without difficulty. The patient tolerated                            the procedure well. The quality of the bowel                            preparation was good. The ileocecal valve,                            appendiceal orifice, and rectum were photographed. Scope In: 2:09:11 PM Scope Out: 2:43:35 PM Scope Withdrawal Time: 0 hours 29 minutes 19 seconds  Total Procedure Duration: 0 hours 34 minutes 24 seconds  Findings:                 The digital rectal exam was normal.                           A 4 mm polyp was found in the cecum. The polyp was                            sessile. The polyp was removed with a cold snare.                            Resection was complete, but the polyp tissue was                            not retrieved.  A 10 mm polyp was found in the transverse colon.                            The polyp was sessile. The polyp was removed with a                            hot snare. Resection and retrieval were complete.                           A 5 mm polyp was found in the sigmoid colon. The                            polyp was sessile. The polyp was removed with a                            cold snare. Resection and retrieval were complete.                           Multiple small and large-mouthed diverticula were                            found in the sigmoid colon and descending colon.                           Two sessile polyps were found in the rectum. The                            polyps were 4 to 5 mm in size. These polyps were                            removed with a cold snare. Resection and retrieval                            were complete.                            Normal mucosa was found in the entire colon. There                            is scarring evidence in the rectosigmoid colon, but                            no evidence for active colitis or proctitis. Four                            biopsies were taken every 10 cm with a cold forceps                            from the left colon for ulcerative colitis                            surveillance. These biopsy specimens  from the left                            colon were sent to Pathology.                           No additional abnormalities were found on                            retroflexion. Complications:            No immediate complications. Estimated Blood Loss:     Estimated blood loss was minimal. Impression:               - One 4 mm polyp in the cecum, removed with a cold                            snare. Complete resection. Polyp tissue not                            retrieved.                           - One 10 mm polyp in the transverse colon, removed                            with a hot snare. Resected and retrieved.                           - One 5 mm polyp in the sigmoid colon, removed with                            a cold snare. Resected and retrieved.                           - Diverticulosis in the sigmoid colon and in the                            descending colon.                           - Two 4 to 5 mm polyps in the rectum, removed with                            a cold snare. Resected and retrieved.                           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                            Biopsied. Recommendation:           - Patient has a contact number available for                            emergencies. The signs and symptoms of potential  delayed complications were discussed with the                            patient. Return to normal activities tomorrow.                            Written discharge  instructions were provided to the                            patient.                           - Resume previous diet.                           - Continue present medications.                           - Await pathology results.                           - Avoid NSAIDs for 3 weeks.                           - Repeat colonoscopy is recommended for                            surveillance. The colonoscopy date will be                            determined after pathology results from today's                            exam become available for review. Jerene Bears, MD 05/08/2018 2:53:15 PM This report has been signed electronically.

## 2018-05-08 NOTE — Progress Notes (Signed)
Called to room to assist during endoscopic procedure.  Patient ID and intended procedure confirmed with present staff. Received instructions for my participation in the procedure from the performing physician.  

## 2018-05-08 NOTE — Progress Notes (Signed)
A/ox3, pleased with MAC, report to RN 

## 2018-05-08 NOTE — Progress Notes (Signed)
No problems noted in the recovery room.  Per Dr. Hilarie Fredrickson ok to take tumeric but hold mobic 3 weeks. maw

## 2018-05-08 NOTE — Patient Instructions (Addendum)
YOU HAD AN ENDOSCOPIC PROCEDURE TODAY AT [facility]:   Refer to the procedure report that was given to you for any specific questions about what was found during the examination.  If the procedure report does not answer your questions, please call your gastroenterologist to clarify.  If you requested that your care partner not be given the details of your procedure findings, then the procedure report has been included in a sealed envelope for you to review at your convenience later.  YOU SHOULD EXPECT: Some feelings of bloating in the abdomen. Passage of more gas than usual.  Walking can help get rid of the air that was put into your GI tract during the procedure and reduce the bloating. If you had a lower endoscopy (such as a colonoscopy or flexible sigmoidoscopy) you may notice spotting of blood in your stool or on the toilet paper. If you underwent a bowel prep for your procedure, you may not have a normal bowel movement for a few days.  Please Note:  You might notice some irritation and congestion in your nose or some drainage.  This is from the oxygen used during your procedure.  There is no need for concern and it should clear up in a day or so.  SYMPTOMS TO REPORT IMMEDIATELY:   Following lower endoscopy (colonoscopy or flexible sigmoidoscopy):  Excessive amounts of blood in the stool  Significant tenderness or worsening of abdominal pains  Swelling of the abdomen that is new, acute  Fever of 100F or higher    For urgent or emergent issues, a gastroenterologist can be reached at any hour by calling [phone].   DIET:  We do recommend a small meal at first, but then you may proceed to your regular diet.  Drink plenty of fluids but you should avoid alcoholic beverages for 24 hours.  ACTIVITY:  You should plan to take it easy for the rest of today and you should NOT DRIVE or use heavy machinery until tomorrow (because of the sedation medicines used during the test).     FOLLOW UP: Our staff will call the number listed on your records the next business day following your procedure to check on you and address any questions or concerns that you may have regarding the information given to you following your procedure. If we do not reach you, we will leave a message.  However, if you are feeling well and you are not experiencing any problems, there is no need to return our call.  We will assume that you have returned to your regular daily activities without incident.  If any biopsies were taken you will be contacted by phone or by letter within the next 1-3 weeks.  Please call us at [phone] if you have not heard about the biopsies in 3 weeks.    SIGNATURES/CONFIDENTIALITY: You and/or your care partner have signed paperwork which will be entered into your electronic medical record.  These signatures attest to the fact that that the information above on your After Visit Summary has been reviewed and is understood.  Full responsibility of the confidentiality of this discharge information lies with you and/or your care-partner.    Handouts were given to your care partner on polyps and diverticulosis. Avoid NSAIDs for  3 weeks per Dr. Hilarie Fredrickson. (Mobic)   You may resume your other current medications today. Await biopsy results. Please call if any questions or concerns.

## 2018-05-09 ENCOUNTER — Telehealth: Payer: Self-pay

## 2018-05-09 NOTE — Telephone Encounter (Signed)
  Follow up Call-  Call back number 05/08/2018  Post procedure Call Back phone  # [phone]  Permission to leave phone message Yes  Some recent data might be hidden     Patient questions:  Do you have a fever, pain , or abdominal swelling? No. Pain Score  0 *  Have you tolerated food without any problems? Yes.    Have you been able to return to your normal activities? Yes.    Do you have any questions about your discharge instructions: Diet   No. Medications  No. Follow up visit  No.  Do you have questions or concerns about your Care? No.  Actions: * If pain score is 4 or above: No action needed, pain <4.

## 2018-05-15 ENCOUNTER — Encounter: Payer: Self-pay | Admitting: Internal Medicine

## 2018-06-07 ENCOUNTER — Encounter: Payer: Self-pay | Admitting: Internal Medicine

## 2018-06-07 ENCOUNTER — Ambulatory Visit: Payer: Medicare HMO | Admitting: Internal Medicine

## 2018-06-07 VITALS — BP 128/68 | HR 75 | Ht 71.0 in | Wt 246.8 lb

## 2018-06-07 DIAGNOSIS — G4733 Obstructive sleep apnea (adult) (pediatric): Secondary | ICD-10-CM

## 2018-06-07 DIAGNOSIS — I480 Paroxysmal atrial fibrillation: Secondary | ICD-10-CM | POA: Diagnosis not present

## 2018-06-07 DIAGNOSIS — R918 Other nonspecific abnormal finding of lung field: Secondary | ICD-10-CM | POA: Diagnosis not present

## 2018-06-07 DIAGNOSIS — J449 Chronic obstructive pulmonary disease, unspecified: Secondary | ICD-10-CM

## 2018-06-07 NOTE — Assessment & Plan Note (Signed)
He will follow-up with Dr. Ander Slade as planned for this.  Denies acute complications.

## 2018-06-07 NOTE — Patient Instructions (Addendum)
Order- DME [facility]- please refill mask of choice, supplies/ hoses/ filters, continue CPAP 8-15, humidifier, AirView/ Card  Please follow-up your screening CT results as planned, with Dr Brigitte Pulse  Please call us if we can help

## 2018-06-07 NOTE — Assessment & Plan Note (Signed)
Paroxysmal atrial fibrillation with no recent recurrence.  He is followed by cardiology on diltiazem.

## 2018-06-07 NOTE — Assessment & Plan Note (Signed)
These are acting inflammatory in this former smoker.  Nodules are being followed by his PCP, Dr. Brigitte Pulse who can ask our help if needed.

## 2018-06-07 NOTE — Assessment & Plan Note (Signed)
He continues to benefit from CPAP with improved sleep.  Download confirms excellent compliance and control. Plan-continue CPAP auto 5-15

## 2018-06-07 NOTE — Progress Notes (Signed)
HPI male former smoker followed for OSA/insomnia by Dr Annamaria Boots, and at this office/ Dr Lamonte Sakai for COPD GOLD complicated by history of chest trauma, also complicated by ulcerative colitis on immunosuppression, HBP, PAFib, small lung nodules  ( Dr Brigitte Pulse) NPSG 11/21/97- AHI 36/ hr, desaturation to 85%, body weight 215 lbs -------------------------------------------------------------------------------------------- 06/06/17- 73 year old male former smoker followed for OSA/insomnia by me, and at this office for COPD GOLD complicated by history of chest trauma( Dr Lamonte Sakai), also complicated by ulcerative colitis on immunosuppression, HBP, PAFib, small lung nodules (Dr Brigitte Pulse) CPAP auto 8-20/[facility] ---OSA; DME: [facility]. Pt wears CPAP nighlty and DL attached. Pt will need order for new supplies today.  He likes his Dreamware CPAP mask but needs refill on the chinstrap.  Pressure is comfortable. Download 100% compliance, AHI 1.1/hour.  Says he "cannot live without it"-cannot sleep without CPAP. Low-dose CT screening follow-up in December-benign pattern.  Also noted aortic atherosclerosis and emphysema.  06/07/2018- 73 year old male former smoker followed for OSA/insomnia by me, and at this office for COPD GOLD complicated by history of chest trauma( Dr Lamonte Sakai), also complicated by ulcerative colitis on immunosuppression, HBP, PAFib, small lung nodules (Dr Brigitte Pulse) CPAP auto 8-15/[facility] Download 100% compliance AHI 0.8/hour -----Obstructive sleep apnea - CPAP Body weight today 246 pounds He reports he is doing "fine" with his CPAP and does not want to sleep without it.  We discussed mask seal and his facial hair.  He is aware of tape used for keeping lips closed if chinstrap is insufficient. He says Dr Brigitte Pulse follows for the small lung nodules and f/u screening chest CT. He does not feel palpitations/A Fib and thinks this was mainly a problem after his extensive trauma several years ago. Low-dose CT screening follow-up-  authorizing Dr Brigitte Pulse- 04/13/18-  IMPRESSION: 1. Lung-RADS 4A, suspicious. Several new pulmonary nodules scattered in both lungs, more likely infectious/inflammatory. Follow up low-dose chest CT without contrast in 3 months (please use the following order, "CT CHEST LCS NODULE FOLLOW-UP W/O CM") is recommended. 2. Cholelithiasis. Aortic Atherosclerosis (ICD10-I70.0) and Emphysema   ROS-see HPI   + = positive Constitutional:   No-   weight loss, night sweats, fevers, chills, fatigue, lassitude. HEENT:   No-  headaches, difficulty swallowing, tooth/dental problems, sore throat,       No-  sneezing, itching, ear ache, nasal congestion, post nasal drip,  CV:  No-   chest pain, orthopnea, PND, swelling in lower extremities, anasarca,                                                        dizziness, palpitations Resp: + shortness of breath with exertion or at rest.              productive cough,  No non-productive cough,  No- coughing up of blood.                change in color of mucus.  No- wheezing.   Skin: No-   rash or lesions. GI:  No-   heartburn, indigestion, GU: . MS:  No-   joint pain or swelling.  . Neuro-     nothing unusual Psych:  No- change in mood or affect. No depression or anxiety.  No memory loss.  OBJ- Physical Exam General- Alert, Oriented, Affect-appropriate, Distress- none acute. Overweight, soft-spoken Skin- rash-none,  lesions- none, excoriation- none Lymphadenopathy- none Head- atraumatic            Eyes- Gross vision intact, PERRLA, conjunctivae and secretions clear            Ears- Hearing, canals-normal            Nose- Clear, no-Septal dev, mucus, polyps, erosion, perforation             Throat- Mallampati IV , mucosa clear , drainage- none, tonsils- atrophic Neck- flexible , trachea midline, no stridor , thyroid nl, carotid no bruit Chest - symmetrical excursion , unlabored           Heart/CV- RRR, no murmur , no gallop  , no rub, nl s1 s2                            - JVD- none , edema- none, stasis changes- none, varices- none           Lung- + diminished/ quiet, wheeze -none, cough-none , dullness-none, rub- none           Chest wall-  Abd-  Br/ Gen/ Rectal- Not done, not indicated Extrem- cyanosis- none, clubbing, none, atrophy- none, strength- nl Neuro- grossly intact to observation

## 2018-06-19 DIAGNOSIS — E785 Hyperlipidemia, unspecified: Secondary | ICD-10-CM | POA: Diagnosis not present

## 2018-06-19 DIAGNOSIS — G4730 Sleep apnea, unspecified: Secondary | ICD-10-CM | POA: Diagnosis not present

## 2018-06-19 DIAGNOSIS — N529 Male erectile dysfunction, unspecified: Secondary | ICD-10-CM | POA: Diagnosis not present

## 2018-06-19 DIAGNOSIS — I4891 Unspecified atrial fibrillation: Secondary | ICD-10-CM | POA: Diagnosis not present

## 2018-06-19 DIAGNOSIS — G4700 Insomnia, unspecified: Secondary | ICD-10-CM | POA: Diagnosis not present

## 2018-06-19 DIAGNOSIS — G8929 Other chronic pain: Secondary | ICD-10-CM | POA: Diagnosis not present

## 2018-06-19 DIAGNOSIS — E669 Obesity, unspecified: Secondary | ICD-10-CM | POA: Diagnosis not present

## 2018-06-19 DIAGNOSIS — J449 Chronic obstructive pulmonary disease, unspecified: Secondary | ICD-10-CM | POA: Diagnosis not present

## 2018-06-19 DIAGNOSIS — I10 Essential (primary) hypertension: Secondary | ICD-10-CM | POA: Diagnosis not present

## 2018-06-19 DIAGNOSIS — D649 Anemia, unspecified: Secondary | ICD-10-CM | POA: Diagnosis not present

## 2018-06-28 DIAGNOSIS — G4733 Obstructive sleep apnea (adult) (pediatric): Secondary | ICD-10-CM | POA: Diagnosis not present

## 2018-07-07 ENCOUNTER — Other Ambulatory Visit (HOSPITAL_COMMUNITY): Payer: Self-pay | Admitting: Internal Medicine

## 2018-07-11 ENCOUNTER — Other Ambulatory Visit: Payer: Self-pay | Admitting: Cardiology

## 2018-07-26 ENCOUNTER — Other Ambulatory Visit: Payer: Self-pay | Admitting: Internal Medicine

## 2018-09-19 DIAGNOSIS — C44219 Basal cell carcinoma of skin of left ear and external auricular canal: Secondary | ICD-10-CM | POA: Diagnosis not present

## 2018-09-19 DIAGNOSIS — D485 Neoplasm of uncertain behavior of skin: Secondary | ICD-10-CM | POA: Diagnosis not present

## 2018-09-19 DIAGNOSIS — C44622 Squamous cell carcinoma of skin of right upper limb, including shoulder: Secondary | ICD-10-CM | POA: Diagnosis not present

## 2018-09-19 DIAGNOSIS — Z85828 Personal history of other malignant neoplasm of skin: Secondary | ICD-10-CM | POA: Diagnosis not present

## 2018-09-19 DIAGNOSIS — L570 Actinic keratosis: Secondary | ICD-10-CM | POA: Diagnosis not present

## 2018-10-03 ENCOUNTER — Other Ambulatory Visit: Payer: Self-pay | Admitting: Internal Medicine

## 2018-10-06 ENCOUNTER — Other Ambulatory Visit: Payer: Self-pay | Admitting: Internal Medicine

## 2018-10-18 DIAGNOSIS — H5203 Hypermetropia, bilateral: Secondary | ICD-10-CM | POA: Diagnosis not present

## 2018-10-18 DIAGNOSIS — H2513 Age-related nuclear cataract, bilateral: Secondary | ICD-10-CM | POA: Diagnosis not present

## 2018-11-20 ENCOUNTER — Telehealth: Payer: Self-pay | Admitting: Emergency Medicine

## 2018-11-20 MED ORDER — TRELEGY ELLIPTA 100-62.5-25 MCG/INH IN AEPB: 1.0000 | INHALATION_SPRAY | Freq: Every day | RESPIRATORY_TRACT | 5 refills | Status: DC

## 2018-11-20 NOTE — Telephone Encounter (Signed)
Call made to patient, confirmed medication and pharmacy. Refill sent it. Nothing further is needed at this time.

## 2018-11-28 DIAGNOSIS — B078 Other viral warts: Secondary | ICD-10-CM | POA: Diagnosis not present

## 2018-11-28 DIAGNOSIS — H35371 Puckering of macula, right eye: Secondary | ICD-10-CM | POA: Diagnosis not present

## 2018-11-28 DIAGNOSIS — H353212 Exudative age-related macular degeneration, right eye, with inactive choroidal neovascularization: Secondary | ICD-10-CM | POA: Diagnosis not present

## 2018-11-28 DIAGNOSIS — Z85828 Personal history of other malignant neoplasm of skin: Secondary | ICD-10-CM | POA: Diagnosis not present

## 2018-11-28 DIAGNOSIS — L820 Inflamed seborrheic keratosis: Secondary | ICD-10-CM | POA: Diagnosis not present

## 2018-11-28 DIAGNOSIS — H2513 Age-related nuclear cataract, bilateral: Secondary | ICD-10-CM | POA: Diagnosis not present

## 2018-11-28 DIAGNOSIS — L570 Actinic keratosis: Secondary | ICD-10-CM | POA: Diagnosis not present

## 2018-11-28 DIAGNOSIS — D485 Neoplasm of uncertain behavior of skin: Secondary | ICD-10-CM | POA: Diagnosis not present

## 2018-12-25 ENCOUNTER — Other Ambulatory Visit: Payer: Self-pay | Admitting: Internal Medicine

## 2018-12-25 ENCOUNTER — Other Ambulatory Visit: Payer: Self-pay | Admitting: Cardiology

## 2018-12-25 ENCOUNTER — Other Ambulatory Visit (HOSPITAL_COMMUNITY): Payer: Self-pay | Admitting: Internal Medicine

## 2018-12-26 ENCOUNTER — Encounter: Payer: Self-pay | Admitting: Internal Medicine

## 2018-12-26 DIAGNOSIS — R82998 Other abnormal findings in urine: Secondary | ICD-10-CM | POA: Diagnosis not present

## 2018-12-26 DIAGNOSIS — E7849 Other hyperlipidemia: Secondary | ICD-10-CM | POA: Diagnosis not present

## 2018-12-26 DIAGNOSIS — L723 Sebaceous cyst: Secondary | ICD-10-CM | POA: Diagnosis not present

## 2018-12-26 DIAGNOSIS — Z23 Encounter for immunization: Secondary | ICD-10-CM | POA: Diagnosis not present

## 2018-12-26 DIAGNOSIS — I10 Essential (primary) hypertension: Secondary | ICD-10-CM | POA: Diagnosis not present

## 2018-12-26 DIAGNOSIS — Z125 Encounter for screening for malignant neoplasm of prostate: Secondary | ICD-10-CM | POA: Diagnosis not present

## 2018-12-26 DIAGNOSIS — R7301 Impaired fasting glucose: Secondary | ICD-10-CM | POA: Diagnosis not present

## 2018-12-26 DIAGNOSIS — Z1331 Encounter for screening for depression: Secondary | ICD-10-CM | POA: Diagnosis not present

## 2018-12-28 DIAGNOSIS — L03818 Cellulitis of other sites: Secondary | ICD-10-CM | POA: Diagnosis not present

## 2018-12-28 DIAGNOSIS — Z85828 Personal history of other malignant neoplasm of skin: Secondary | ICD-10-CM | POA: Diagnosis not present

## 2018-12-29 DIAGNOSIS — G4733 Obstructive sleep apnea (adult) (pediatric): Secondary | ICD-10-CM | POA: Diagnosis not present

## 2019-01-02 DIAGNOSIS — I714 Abdominal aortic aneurysm, without rupture: Secondary | ICD-10-CM | POA: Diagnosis not present

## 2019-01-02 DIAGNOSIS — I700 Atherosclerosis of aorta: Secondary | ICD-10-CM | POA: Diagnosis not present

## 2019-01-02 DIAGNOSIS — J449 Chronic obstructive pulmonary disease, unspecified: Secondary | ICD-10-CM | POA: Diagnosis not present

## 2019-01-02 DIAGNOSIS — E785 Hyperlipidemia, unspecified: Secondary | ICD-10-CM | POA: Diagnosis not present

## 2019-01-02 DIAGNOSIS — I10 Essential (primary) hypertension: Secondary | ICD-10-CM | POA: Diagnosis not present

## 2019-01-02 DIAGNOSIS — Z0000 Encounter for general adult medical examination without abnormal findings: Secondary | ICD-10-CM | POA: Diagnosis not present

## 2019-01-02 DIAGNOSIS — K5190 Ulcerative colitis, unspecified, without complications: Secondary | ICD-10-CM | POA: Diagnosis not present

## 2019-01-02 DIAGNOSIS — R7301 Impaired fasting glucose: Secondary | ICD-10-CM | POA: Diagnosis not present

## 2019-01-02 DIAGNOSIS — Z8679 Personal history of other diseases of the circulatory system: Secondary | ICD-10-CM | POA: Diagnosis not present

## 2019-01-02 DIAGNOSIS — G4733 Obstructive sleep apnea (adult) (pediatric): Secondary | ICD-10-CM | POA: Diagnosis not present

## 2019-01-05 ENCOUNTER — Other Ambulatory Visit: Payer: Self-pay | Admitting: Internal Medicine

## 2019-01-05 DIAGNOSIS — R911 Solitary pulmonary nodule: Secondary | ICD-10-CM

## 2019-01-07 ENCOUNTER — Other Ambulatory Visit: Payer: Self-pay | Admitting: Internal Medicine

## 2019-01-10 DIAGNOSIS — Z1212 Encounter for screening for malignant neoplasm of rectum: Secondary | ICD-10-CM | POA: Diagnosis not present

## 2019-01-11 ENCOUNTER — Other Ambulatory Visit: Payer: Self-pay | Admitting: Internal Medicine

## 2019-01-12 ENCOUNTER — Ambulatory Visit
Admission: RE | Admit: 2019-01-12 | Discharge: 2019-01-12 | Disposition: A | Discharge status: Home / Self Care | Payer: Medicare HMO | Source: Ambulatory Visit | Attending: Internal Medicine | Admitting: Internal Medicine

## 2019-01-12 ENCOUNTER — Other Ambulatory Visit: Payer: Self-pay

## 2019-01-12 DIAGNOSIS — R911 Solitary pulmonary nodule: Secondary | ICD-10-CM

## 2019-01-12 DIAGNOSIS — R918 Other nonspecific abnormal finding of lung field: Secondary | ICD-10-CM | POA: Diagnosis not present

## 2019-01-17 DIAGNOSIS — L821 Other seborrheic keratosis: Secondary | ICD-10-CM | POA: Diagnosis not present

## 2019-01-17 DIAGNOSIS — L570 Actinic keratosis: Secondary | ICD-10-CM | POA: Diagnosis not present

## 2019-01-17 DIAGNOSIS — D692 Other nonthrombocytopenic purpura: Secondary | ICD-10-CM | POA: Diagnosis not present

## 2019-01-17 DIAGNOSIS — Z85828 Personal history of other malignant neoplasm of skin: Secondary | ICD-10-CM | POA: Diagnosis not present

## 2019-01-17 DIAGNOSIS — D1801 Hemangioma of skin and subcutaneous tissue: Secondary | ICD-10-CM | POA: Diagnosis not present

## 2019-01-17 DIAGNOSIS — L820 Inflamed seborrheic keratosis: Secondary | ICD-10-CM | POA: Diagnosis not present

## 2019-01-17 DIAGNOSIS — D485 Neoplasm of uncertain behavior of skin: Secondary | ICD-10-CM | POA: Diagnosis not present

## 2019-01-17 DIAGNOSIS — L814 Other melanin hyperpigmentation: Secondary | ICD-10-CM | POA: Diagnosis not present

## 2019-02-20 ENCOUNTER — Other Ambulatory Visit: Payer: Self-pay | Admitting: Internal Medicine

## 2019-02-26 ENCOUNTER — Other Ambulatory Visit: Payer: Self-pay | Admitting: Internal Medicine

## 2019-03-26 ENCOUNTER — Other Ambulatory Visit: Payer: Self-pay | Admitting: Internal Medicine

## 2019-04-14 ENCOUNTER — Other Ambulatory Visit: Payer: Self-pay | Admitting: Internal Medicine

## 2019-05-09 ENCOUNTER — Ambulatory Visit: Payer: Medicare HMO | Admitting: Nurse Practitioner

## 2019-05-09 ENCOUNTER — Other Ambulatory Visit: Payer: Self-pay | Admitting: Emergency Medicine

## 2019-05-10 ENCOUNTER — Other Ambulatory Visit: Payer: Self-pay

## 2019-05-10 ENCOUNTER — Ambulatory Visit: Payer: Medicare HMO | Admitting: Emergency Medicine

## 2019-05-10 ENCOUNTER — Encounter: Payer: Self-pay | Admitting: Emergency Medicine

## 2019-05-10 DIAGNOSIS — G4733 Obstructive sleep apnea (adult) (pediatric): Secondary | ICD-10-CM | POA: Diagnosis not present

## 2019-05-10 DIAGNOSIS — J449 Chronic obstructive pulmonary disease, unspecified: Secondary | ICD-10-CM | POA: Diagnosis not present

## 2019-05-10 DIAGNOSIS — J301 Allergic rhinitis due to pollen: Secondary | ICD-10-CM

## 2019-05-10 MED ORDER — TRELEGY ELLIPTA 100-62.5-25 MCG/INH IN AEPB: 1.0000 | INHALATION_SPRAY | Freq: Every day | RESPIRATORY_TRACT | 12 refills | Status: DC

## 2019-05-10 NOTE — Assessment & Plan Note (Signed)
Continue your CPAP every night and follow with Dr. Annamaria Boots as planned.

## 2019-05-10 NOTE — Assessment & Plan Note (Signed)
No longer requiring flonase

## 2019-05-10 NOTE — Patient Instructions (Signed)
Please continue your Trelegy 1 inhalation once daily as you have been taking it.  Rinse and gargle after using. Keep albuterol available use 2 puffs if needed for shortness of breath, chest tightness, wheezing. Flu shot is up-to-date Please get your COVID-19 vaccine when possible.  The [facility] vaccine hotline is [phone].  Continue your CPAP every night and follow with Dr. Annamaria Boots as planned. Follow with Dr. Lamonte Sakai in 12 months or sooner if you have any problems.

## 2019-05-10 NOTE — Assessment & Plan Note (Signed)
Please continue your Trelegy 1 inhalation once daily as you have been taking it.  Rinse and gargle after using. Keep albuterol available use 2 puffs if needed for shortness of breath, chest tightness, wheezing. Flu shot is up-to-date Please get your COVID-19 vaccine when possible.  The [facility] vaccine hotline is ([phone].  Follow with Dr. Lamonte Sakai in 12 months or sooner if you have any problems.

## 2019-05-10 NOTE — Addendum Note (Signed)
Addended by: Tery Sanfilippo R on: 05/10/2019 10:17 AM   Modules accepted: Orders

## 2019-05-10 NOTE — Progress Notes (Signed)
  HPI:   ROV 05/10/19 --follow-up visit for 74 year old man former smoker (pack years) with history of ulcerative colitis, hypertension, obstructive sleep apnea on CPAP (Dr. Annamaria Boots), COPD with a positive bronchodilator response.  Currently managed on Trelegy. He is able to garden, yard work. He has to pace himself - able to walk indefinitely. No flares, bronchitis sx. No abx or pred. No cough, no mucous or wheeze. He is compliant with CPAP - questions whether his machine is old enough to be replaced.  Off flonase.    No flowsheet data found.  O:  Vitals:   05/10/19 0952  BP: 134/64  Pulse: 75  Temp: 97.9 F (36.6 C)  TempSrc: Temporal  SpO2: 95%  Weight: 248 lb 6.4 oz (112.7 kg)  Height: 6' (1.829 m)    Gen: Pleasant, overwt, in no distress,  normal affect  ENT: No lesions,  mouth clear,  oropharynx clear, no postnasal drip  Neck: No JVD, no stridor  Lungs: No use of accessory muscles, no rhonchi, no wheezes   Cardiovascular: RRR, heart sounds normal, no murmur or gallops, no peripheral edema  Musculoskeletal: No deformities, no cyanosis or clubbing  Neuro: alert, non focal  Skin: Warm, no lesions or rashes   CXR 08/23/2012  Chronic changes   COPD GOLD II Please continue your Trelegy 1 inhalation once daily as you have been taking it.  Rinse and gargle after using. Keep albuterol available use 2 puffs if needed for shortness of breath, chest tightness, wheezing. Flu shot is up-to-date Please get your COVID-19 vaccine when possible.  The [facility] vaccine hotline is [phone].  Follow with Dr. Lamonte Sakai in 12 months or sooner if you have any problems.   Allergic rhinitis No longer requiring flonase  Obstructive sleep apnea Continue your CPAP every night and follow with Dr. Annamaria Boots as planned.  Baltazar Apo, MD, PhD 05/10/2019, 10:13 AM [facility] [phone] or if no answer [phone]

## 2019-05-11 ENCOUNTER — Other Ambulatory Visit: Payer: Self-pay | Admitting: Internal Medicine

## 2019-05-16 ENCOUNTER — Other Ambulatory Visit (INDEPENDENT_AMBULATORY_CARE_PROVIDER_SITE_OTHER): Payer: Medicare HMO

## 2019-05-16 ENCOUNTER — Ambulatory Visit: Payer: Medicare HMO | Admitting: Physician Assistant

## 2019-05-16 ENCOUNTER — Encounter: Payer: Self-pay | Admitting: Physician Assistant

## 2019-05-16 VITALS — BP 142/62 | HR 84 | Temp 98.2°F | Ht 71.0 in | Wt 250.6 lb

## 2019-05-16 DIAGNOSIS — K5150 Left sided colitis without complications: Secondary | ICD-10-CM

## 2019-05-16 LAB — CBC WITH DIFFERENTIAL/PLATELET
Basophils Absolute: 0.1 10*3/uL (ref 0.0–0.1)
Basophils Relative: 1.3 % (ref 0.0–3.0)
Eosinophils Absolute: 0.1 10*3/uL (ref 0.0–0.7)
Eosinophils Relative: 2.3 % (ref 0.0–5.0)
HCT: 43.4 % (ref 39.0–52.0)
Hemoglobin: 14.4 g/dL (ref 13.0–17.0)
Lymphocytes Relative: 14 % (ref 12.0–46.0)
Lymphs Abs: 0.7 10*3/uL (ref 0.7–4.0)
MCHC: 33.3 g/dL (ref 30.0–36.0)
MCV: 101.2 fl — ABNORMAL HIGH (ref 78.0–100.0)
Monocytes Absolute: 0.7 10*3/uL (ref 0.1–1.0)
Monocytes Relative: 13.1 % — ABNORMAL HIGH (ref 3.0–12.0)
Neutro Abs: 3.6 10*3/uL (ref 1.4–7.7)
Neutrophils Relative %: 69.3 % (ref 43.0–77.0)
Platelets: 153 10*3/uL (ref 150.0–400.0)
RBC: 4.29 Mil/uL (ref 4.22–5.81)
RDW: 14.6 % (ref 11.5–15.5)
WBC: 5.2 10*3/uL (ref 4.0–10.5)

## 2019-05-16 LAB — COMPREHENSIVE METABOLIC PANEL
ALT: 31 U/L (ref 0–53)
AST: 19 U/L (ref 0–37)
Albumin: 4.2 g/dL (ref 3.5–5.2)
Alkaline Phosphatase: 40 U/L (ref 39–117)
BUN: 18 mg/dL (ref 6–23)
CO2: 31 mEq/L (ref 19–32)
Calcium: 9.3 mg/dL (ref 8.4–10.5)
Chloride: 103 mEq/L (ref 96–112)
Creatinine, Ser: 0.97 mg/dL (ref 0.40–1.50)
GFR: 76.05 mL/min (ref >60.00)
Glucose, Bld: 98 mg/dL (ref 70–99)
Potassium: 3.7 mEq/L (ref 3.5–5.1)
Sodium: 140 mEq/L (ref 135–145)
Total Bilirubin: 0.5 mg/dL (ref 0.2–1.2)
Total Protein: 7 g/dL (ref 6.0–8.3)

## 2019-05-16 MED ORDER — AMOXICILLIN-POT CLAVULANATE 875-125 MG PO TABS: 1.0000 | ORAL_TABLET | Freq: Two times a day (BID) | ORAL | 0 refills | Status: DC

## 2019-05-16 MED ORDER — MERCAPTOPURINE 50 MG PO TABS: 100.0000 mg | ORAL_TABLET | Freq: Every day | ORAL | 1 refills | Status: DC

## 2019-05-16 MED ORDER — FOLIC ACID 1 MG PO TABS: 1.0000 mg | ORAL_TABLET | Freq: Every day | ORAL | 2 refills | Status: DC

## 2019-05-16 MED ORDER — SULFASALAZINE 500 MG PO TBEC: DELAYED_RELEASE_TABLET | ORAL | 2 refills | Status: DC

## 2019-05-16 NOTE — Progress Notes (Signed)
Subjective:    Patient ID: Ricardo Cooper, male    DOB: Mar 30, 1946, 74 y.o.   MRN: [id number]  HPI [name] is a pleasant 74 year old white male, known to Dr. Hilarie Fredrickson with history of left-sided ulcerative colitis longstanding who comes in today for routine follow-up.  He also has history of hypertension, atrial fibrillation, COPD, sleep apnea, hyperlipidemia, adenomatous colon polyps and diverticulosis. He was last seen in January 2020 when he had colonoscopy.  He was found to have 5 polyps, the largest 10 mm.  Path showed 2 of the polyps to be tubular adenomas and 3 were hyperplastic.  He had normal-appearing colonic mucosa throughout and multiple diverticuli in the descending and sigmoid colon. He has been maintained on Purinethol 100 mg p.o. daily, Azulfidine 500 mg 2 p.o. 3 times daily and folic acid 1 mg daily. He says he has been doing well over the past year without any active symptoms.  He denies any problems with abdominal pain, rectal bleeding or changes in bowel habits. He does mention that he had onset of some left lower quadrant pain/discomfort over this past weekend and wondered if he may have some mild diverticulitis.  This is not been associated with any fever or chills.  Appetite has been good, no change in discomfort with p.o. intake.  No change in bowel habits or bleeding.  He says he prefers not to take antibiotics if he does not have to.  Review of Systems Pertinent positive and negative review of systems were noted in the above HPI section.  All other review of systems was otherwise negative.  Outpatient Encounter Medications as of 05/16/2019  Medication Sig  . acetaminophen (TYLENOL) 500 MG tablet Take 500 mg by mouth every 6 (six) hours as needed for mild pain.  Marland Kitchen albuterol (PROVENTIL HFA) 108 (90 BASE) MCG/ACT inhaler Inhale 2 puffs into the lungs every 6 (six) hours as needed.    . Coenzyme Q10 100 MG TABS Take 100 mg by mouth daily.   Marland Kitchen diltiazem (CARDIZEM CD) 180 MG 24  hr capsule TAKE 1 CAPSULE BY MOUTH EVERY DAY  . ezetimibe (ZETIA) 10 MG tablet Take 10 mg by mouth daily.  . fish oil-omega-3 fatty acids 1000 MG capsule Take 2 g by mouth daily.    . Fluticasone-Umeclidin-Vilant (TRELEGY ELLIPTA) 100-62.5-25 MCG/INH AEPB Inhale 1 puff into the lungs daily.  . folic acid (FOLVITE) 1 MG tablet Take 1 tablet (1 mg total) by mouth daily.  Marland Kitchen losartan-hydrochlorothiazide (HYZAAR) 50-12.5 MG tablet TAKE 1 TABLET BY MOUTH EVERY DAY  . Melatonin 5 MG TABS Take 10 mg by mouth at bedtime.   . mercaptopurine (PURINETHOL) 50 MG tablet Take 2 tablets (100 mg total) by mouth daily. Give on an empty stomach 1 hour before or 2 hours after meals. Caution: Chemotherapy.  . pravastatin (PRAVACHOL) 20 MG tablet Take 20 mg by mouth at bedtime.  . sulfaSALAzine (AZULFIDINE) 500 MG EC tablet Take 2 tablets by mouth three times daily.  . vitamin B-12 (CYANOCOBALAMIN) 250 MCG tablet Take 1,000 mcg by mouth daily.   . vitamin C (ASCORBIC ACID) 250 MG tablet Take 500 mg by mouth daily.  . [DISCONTINUED] folic acid (FOLVITE) 1 MG tablet TAKE 1 TABLET BY MOUTH EVERY DAY  . [DISCONTINUED] mercaptopurine (PURINETHOL) 50 MG tablet TAKE 2 TABLETS BY MOUTH EVERY DAY  . [DISCONTINUED] sulfaSALAzine (AZULFIDINE) 500 MG EC tablet TAKE 2 TABLETS BY MOUTH IN THE MORNING AND 2 TABS AT LUNCH. AND 2 TABS AT DINNER.  . [  DISCONTINUED] Turmeric (QC TUMERIC COMPLEX) 500 MG CAPS Take 1,600 mg by mouth.  Marland Kitchen amoxicillin-clavulanate (AUGMENTIN) 875-125 MG tablet Take 1 tablet by mouth 2 (two) times daily.  . [DISCONTINUED] CORTIFOAM 90 MG rectal foam   . [DISCONTINUED] diltiazem (DILACOR XR) 180 MG 24 hr capsule Take 1 capsule (180 mg total) by mouth daily.  . [DISCONTINUED] meloxicam (MOBIC) 15 MG tablet Take 15 mg as needed by mouth.    No facility-administered encounter medications on file as of 05/16/2019.   Allergies  Allergen Reactions  . Ibuprofen Other (See Comments)    REACTION: causes colitis flare  Pt was told not to take Ibuprofen d/t reaction   Patient Active Problem List   Diagnosis Date Noted  . Multiple lung nodules 06/07/2018  . Influenza with respiratory manifestation 06/06/2015  . Ulcerative colitis, left sided ([place]) 05/03/2011  . BRONCHITIS, ACUTE 04/22/2010  . PURE HYPERCHOLESTEROLEMIA 03/23/2010  . PRECORDIAL PAIN 03/23/2010  . CHEST TIGHTNESS-PRESSURE-OTHER 03/18/2010  . DYSPNEA 11/19/2008  . HYPERLIPIDEMIA 02/01/2008  . HYPERTENSION 02/01/2008  . ATRIAL FIBRILLATION 02/01/2008  . Allergic rhinitis 02/01/2008  . COPD GOLD II 02/01/2008  . Obstructive sleep apnea 02/01/2008   Social History   Socioeconomic History  . Marital status: Married    Spouse name: Not on file  . Number of children: 2  . Years of education: Not on file  . Highest education level: Not on file  Occupational History  . Occupation: Physiological scientist    Comment: Right [employer]  Tobacco Use  . Smoking status: Former Smoker    Packs/day: 1.00    Years: 35.00    Pack years: 35.00    Types: Cigarettes    Quit date: 04/26/2001    Years since quitting: 18.0  . Smokeless tobacco: Former Systems developer    Types: Chew    Quit date: 06/25/1991  . Tobacco comment: quit in 2005  Substance and Sexual Activity  . Alcohol use: Yes    Alcohol/week: 3.0 - 4.0 standard drinks    Types: 3 - 4 Cans of beer per week  . Drug use: No  . Sexual activity: Not on file  Other Topics Concern  . Not on file  Social History Narrative  . Not on file   Social Determinants of Health   Financial Resource Strain:   . Difficulty of Paying Living Expenses: Not on file  Food Insecurity:   . Worried About Charity fundraiser in the Last Year: Not on file  . Ran Out of Food in the Last Year: Not on file  Transportation Needs:   . Lack of Transportation (Medical): Not on file  . Lack of Transportation (Non-Medical): Not on file  Physical Activity:   . Days of Exercise per Week: Not on file  . Minutes of Exercise per  Session: Not on file  Stress:   . Feeling of Stress : Not on file  Social Connections:   . Frequency of Communication with Friends and Family: Not on file  . Frequency of Social Gatherings with Friends and Family: Not on file  . Attends Religious Services: Not on file  . Active Member of Clubs or Organizations: Not on file  . Attends Archivist Meetings: Not on file  . Marital Status: Not on file  Intimate Partner Violence:   . Fear of Current or Ex-Partner: Not on file  . Emotionally Abused: Not on file  . Physically Abused: Not on file  . Sexually Abused: Not on file  Mr. Eisemann family history includes Allergies in his mother; Asthma in his sister; Emphysema in his father and maternal grandfather; Heart disease in his father and maternal grandfather; Stroke in his maternal grandfather.      Objective:    Vitals:   05/16/19 1407  BP: (!) 142/62  Pulse: 84  Temp: 98.2 F (36.8 C)    Physical Exam Well-developed well-nourished older white male in no acute distress.  Weight, 250 BMI 34.9 HEENT; nontraumatic normocephalic, EOMI, PER R LA, sclera anicteric. Oropharynx; not examined/mask/Covid Neck; supple, no JVD Cardiovascular; regular rate and rhythm with S1-S2, no murmur rub or gallop Pulmonary; Clear bilaterally Abdomen; soft, he has some minimal tenderness deep in the left lower quadrant no guarding, nondistended, no palpable mass or hepatosplenomegaly, bowel sounds are active Rectal; not done today Skin; benign exam, no jaundice rash or appreciable lesions Extremities; no clubbing cyanosis or edema skin warm and dry Neuro/Psych; alert and oriented x4, grossly nonfocal mood and affect appropriate       Assessment & Plan:   #66 74 year old white male with left-sided ulcerative colitis, in remission on current regimen. #2 history of adenomatous colon polyps-last colonoscopy January 2020, due for follow-up January 2023 #3    3 to 4-day history of mild  left lower quadrant discomfort, actually improved today-rule out mild diverticulitis  Plan; CBC with differential, c-Met Will refill sulfasalazine 500 mg, 2 p.o. 3 times daily x1 year Refill Purinethol 100 mg (takes 2-50 mg tablets) daily x1 year Refill folic acid 1 mg p.o. daily Will send a prescription for Augmentin 875 mg p.o. twice daily x10 days.  Patient will observe his symptoms over the next 48 hours.  If he continues to have resolution, no antibiotic, if symptoms are persisting or worsen he will fill the prescription and complete a 10-day course. Indicated for follow-up colonoscopy 2023. Plan office follow-up with Dr. Donnella Sham in 1 year or sooner as needed  Jakiah Goree Genia Harold PA-C 05/16/2019   Cc: Marton Redwood, MD

## 2019-05-16 NOTE — Patient Instructions (Addendum)
If you are age 73 or older, your body mass index should be between 23-30. Your Body mass index is 34.95 kg/m. If this is out of the aforementioned range listed, please consider follow up with your Primary Care Provider.   We have sent the following medications to your pharmacy for you to pick up at your convenience: Augmentin, Folic Acid, Mercaptopurine, and Sulfasalazine    Your provider has requested that you go to the basement level for lab work before leaving today. Press "B" on the elevator. The lab is located at the first door on the left as you exit the elevator.  Thank you for choosing me and [facility].  Amy Esterwood-PA

## 2019-05-17 NOTE — Progress Notes (Signed)
Addendum: Reviewed and agree with assessment and management plan. Halston Kintz M, MD  

## 2019-05-24 ENCOUNTER — Ambulatory Visit: Payer: Self-pay

## 2019-06-01 ENCOUNTER — Ambulatory Visit: Payer: Self-pay

## 2019-06-04 ENCOUNTER — Ambulatory Visit: Payer: Medicare HMO

## 2019-06-12 ENCOUNTER — Ambulatory Visit: Payer: Medicare HMO

## 2019-06-12 ENCOUNTER — Other Ambulatory Visit: Payer: Self-pay

## 2019-06-12 ENCOUNTER — Encounter: Payer: Self-pay | Admitting: Internal Medicine

## 2019-06-12 ENCOUNTER — Ambulatory Visit: Payer: Medicare HMO | Admitting: Internal Medicine

## 2019-06-12 DIAGNOSIS — G4733 Obstructive sleep apnea (adult) (pediatric): Secondary | ICD-10-CM

## 2019-06-12 DIAGNOSIS — J449 Chronic obstructive pulmonary disease, unspecified: Secondary | ICD-10-CM

## 2019-06-12 DIAGNOSIS — H2513 Age-related nuclear cataract, bilateral: Secondary | ICD-10-CM | POA: Diagnosis not present

## 2019-06-12 DIAGNOSIS — R918 Other nonspecific abnormal finding of lung field: Secondary | ICD-10-CM

## 2019-06-12 DIAGNOSIS — H353212 Exudative age-related macular degeneration, right eye, with inactive choroidal neovascularization: Secondary | ICD-10-CM | POA: Diagnosis not present

## 2019-06-12 DIAGNOSIS — H43823 Vitreomacular adhesion, bilateral: Secondary | ICD-10-CM | POA: Diagnosis not present

## 2019-06-12 NOTE — Progress Notes (Signed)
HPI male former smoker followed for OSA/insomnia by Dr Annamaria Boots, and at this office/ Dr Lamonte Sakai for COPD GOLD complicated by history of chest trauma, also complicated by ulcerative colitis on immunosuppression, HBP, PAFib, small lung nodules  ( Dr Brigitte Pulse) NPSG 11/21/97- AHI 36/ hr, desaturation to 85%, body weight 215 lbs --------------------------------------------------------------------------------------------  06/07/2018- 74 year old male former smoker followed for OSA/insomnia by me, and at this office for COPD GOLD complicated by history of chest trauma( Dr Lamonte Sakai), also complicated by ulcerative colitis on immunosuppression, HBP, PAFib, small lung nodules (Dr Brigitte Pulse) CPAP auto 8-15/[facility] Download 100% compliance AHI 0.8/hour -----Obstructive sleep apnea - CPAP Body weight today 246 pounds He reports he is doing "fine" with his CPAP and does not want to sleep without it.  We discussed mask seal and his facial hair.  He is aware of tape used for keeping lips closed if chinstrap is insufficient. He says Dr Brigitte Pulse follows for the small lung nodules and f/u screening chest CT. He does not feel palpitations/A Fib and thinks this was mainly a problem after his extensive trauma several years ago. Low-dose CT screening follow-up- authorizing Dr Brigitte Pulse- 04/13/18-  IMPRESSION: 1. Lung-RADS 4A, suspicious. Several new pulmonary nodules scattered in both lungs, more likely infectious/inflammatory. Follow up low-dose chest CT without contrast in 3 months (please use the following order, "CT CHEST LCS NODULE FOLLOW-UP W/O CM") is recommended. 2. Cholelithiasis. Aortic Atherosclerosis (ICD10-I70.0) and Emphysema   06/12/19-  74 year old male former smoker( 35 pk yrs) followed for OSA/insomnia by me, and at this office for COPD GOLD complicated by history of chest trauma( Dr Lamonte Sakai), also complicated by ulcerative colitis on immunosuppression, HBP, PAFib, small lung nodules (Dr Brigitte Pulse) CPAP auto 8-15/[facility] Download  compliance 100%, AHI 0.6/ hr Body weight today - 250 lbs -----f/u OSA.Patient stated breathing is at his basline. Recent ov w Dr Lamonte Sakai. Continues to follow with Dr Lamonte Sakai for general pulmonary. albuterol hfa, neb albuterol, Trelegy. Little routine cough or wheeze. Does sneeze some- suggest Flonase.  Had flu vax and first covid vax. Comfortable with CPAP machine which is approaching 73 years old but still works ok. Reviewed download. CT chest 01/12/19- IMPRESSION: Prior tree-in-bud nodularity in the posterior right middle lobe has resolved, likely infectious/inflammatory. New 4 mm nodule in the left lung apex. Additional mild subpleural nodularity in the posterior right upper lobe, likely infectious/inflammatory. Consider follow-up CT chest in 6 months. Additional bilateral pulmonary nodules are grossly unchanged, including a dominant 7.5 mm nodule in the left upper lobe. Aortic Atherosclerosis (ICD10-I70.0) and Emphysema (ICD10-J43.9).  ROS-see HPI   + = positive Constitutional:   No-   weight loss, night sweats, fevers, chills, fatigue, lassitude. HEENT:   No-  headaches, difficulty swallowing, tooth/dental problems, sore throat,       No-  sneezing, itching, ear ache, nasal congestion, post nasal drip,  CV:  No-   chest pain, orthopnea, PND, swelling in lower extremities, anasarca,                                                        dizziness, palpitations Resp: + shortness of breath with exertion or at rest.              productive cough,  No non-productive cough,  No- coughing up of blood.  change in color of mucus.  No- wheezing.   Skin: No-   rash or lesions. GI:  No-   heartburn, indigestion, GU: . MS:  No-   joint pain or swelling.  . Neuro-     nothing unusual Psych:  No- change in mood or affect. No depression or anxiety.  No memory loss.  OBJ- Physical Exam General- Alert, Oriented, Affect-appropriate, Distress- none acute. +Overweight, soft-spoken Skin-  rash-none, lesions- none, excoriation- none Lymphadenopathy- none Head- atraumatic            Eyes- Gross vision intact, PERRLA, conjunctivae and secretions clear            Ears- Hearing, canals-normal            Nose- Clear, no-Septal dev, mucus, polyps, erosion, perforation             Throat- Mallampati IV , mucosa clear , drainage- none, tonsils- atrophic Neck- flexible , trachea midline, no stridor , thyroid nl, carotid no bruit Chest - symmetrical excursion , unlabored           Heart/CV- RRR, no murmur , no gallop  , no rub, nl s1 s2                           - JVD- none , edema- none, stasis changes- none, varices- none           Lung- + diffuse mild crackles, wheeze -none, cough-none , dullness-none, rub- none           Chest wall-  Abd-  Br/ Gen/ Rectal- Not done, not indicated Extrem- cyanosis- none, clubbing, none, atrophy- none, strength- nl Neuro- grossly intact to observation

## 2019-06-12 NOTE — Assessment & Plan Note (Signed)
Continuing surveillance with Dr Lamonte Sakai and Dr Brigitte Pulse.

## 2019-06-12 NOTE — Patient Instructions (Signed)
We can continue CPAP auto 8-15, mask of choice, humidifier, supplies, Air'view/ card  You can ask [employer] when you would be eligible to replace your current machine. Please cal [place] if we can help

## 2019-06-12 NOTE — Assessment & Plan Note (Signed)
He benefits from CPAP and download confirms good compliance and control Plan- continue auto 8-15. Can replace machine when eligible.

## 2019-06-12 NOTE — Assessment & Plan Note (Signed)
Encouraged to contact Dr Lamonte Sakai if he notes symptom change. I hear more crackle in lungs than I had noted last year.

## 2019-06-28 DIAGNOSIS — G4733 Obstructive sleep apnea (adult) (pediatric): Secondary | ICD-10-CM | POA: Diagnosis not present

## 2019-07-12 DIAGNOSIS — D485 Neoplasm of uncertain behavior of skin: Secondary | ICD-10-CM | POA: Diagnosis not present

## 2019-07-12 DIAGNOSIS — L570 Actinic keratosis: Secondary | ICD-10-CM | POA: Diagnosis not present

## 2019-07-12 DIAGNOSIS — L820 Inflamed seborrheic keratosis: Secondary | ICD-10-CM | POA: Diagnosis not present

## 2019-07-12 DIAGNOSIS — Z85828 Personal history of other malignant neoplasm of skin: Secondary | ICD-10-CM | POA: Diagnosis not present

## 2019-07-12 DIAGNOSIS — C44622 Squamous cell carcinoma of skin of right upper limb, including shoulder: Secondary | ICD-10-CM | POA: Diagnosis not present

## 2019-07-18 DIAGNOSIS — H269 Unspecified cataract: Secondary | ICD-10-CM | POA: Diagnosis not present

## 2019-07-18 DIAGNOSIS — I4891 Unspecified atrial fibrillation: Secondary | ICD-10-CM | POA: Diagnosis not present

## 2019-07-18 DIAGNOSIS — G4700 Insomnia, unspecified: Secondary | ICD-10-CM | POA: Diagnosis not present

## 2019-07-18 DIAGNOSIS — G4730 Sleep apnea, unspecified: Secondary | ICD-10-CM | POA: Diagnosis not present

## 2019-07-18 DIAGNOSIS — D6869 Other thrombophilia: Secondary | ICD-10-CM | POA: Diagnosis not present

## 2019-07-18 DIAGNOSIS — K5190 Ulcerative colitis, unspecified, without complications: Secondary | ICD-10-CM | POA: Diagnosis not present

## 2019-07-18 DIAGNOSIS — G8929 Other chronic pain: Secondary | ICD-10-CM | POA: Diagnosis not present

## 2019-07-18 DIAGNOSIS — Z008 Encounter for other general examination: Secondary | ICD-10-CM | POA: Diagnosis not present

## 2019-07-18 DIAGNOSIS — E785 Hyperlipidemia, unspecified: Secondary | ICD-10-CM | POA: Diagnosis not present

## 2019-07-18 DIAGNOSIS — J449 Chronic obstructive pulmonary disease, unspecified: Secondary | ICD-10-CM | POA: Diagnosis not present

## 2019-07-18 DIAGNOSIS — E669 Obesity, unspecified: Secondary | ICD-10-CM | POA: Diagnosis not present

## 2019-07-19 ENCOUNTER — Other Ambulatory Visit: Payer: Self-pay | Admitting: Internal Medicine

## 2019-07-19 DIAGNOSIS — R911 Solitary pulmonary nodule: Secondary | ICD-10-CM

## 2019-09-03 ENCOUNTER — Other Ambulatory Visit: Payer: Self-pay | Admitting: Physician Assistant

## 2019-09-11 DIAGNOSIS — H353212 Exudative age-related macular degeneration, right eye, with inactive choroidal neovascularization: Secondary | ICD-10-CM | POA: Diagnosis not present

## 2019-09-18 ENCOUNTER — Ambulatory Visit (INDEPENDENT_AMBULATORY_CARE_PROVIDER_SITE_OTHER): Payer: Medicare HMO | Admitting: Podiatry

## 2019-09-18 ENCOUNTER — Encounter: Payer: Self-pay | Admitting: Podiatry

## 2019-09-18 ENCOUNTER — Other Ambulatory Visit: Payer: Self-pay

## 2019-09-18 DIAGNOSIS — B351 Tinea unguium: Secondary | ICD-10-CM | POA: Diagnosis not present

## 2019-09-18 DIAGNOSIS — M79675 Pain in left toe(s): Secondary | ICD-10-CM

## 2019-09-18 DIAGNOSIS — M79674 Pain in right toe(s): Secondary | ICD-10-CM | POA: Diagnosis not present

## 2019-09-18 NOTE — Progress Notes (Signed)
This patient presents  to the office for evaluation and treatment of long thick painful nails .  This patient is unable to trim his own nails since the patient cannot reach his feet.  Patient says the nails are painful walking and wearing his shoes.  He returns for preventive foot care services.  General Appearance  Alert, conversant and in no acute stress.  Vascular  Dorsalis pedis and posterior tibial  pulses are palpable  bilaterally.  Capillary return is within normal limits  bilaterally. Temperature is within normal limits  bilaterally.  Neurologic  Senn-Weinstein monofilament wire test within normal limits  Right.  LOPS  Diminished left foot.. Muscle power within normal limits bilaterally.  Nails Thick disfigured discolored nails with subungual debris  Hallux nails  B/L.Marland Kitchen No evidence of bacterial infection or drainage bilaterally.  Orthopedic  No limitations of motion  feet .  No crepitus or effusions noted.  No bony pathology or digital deformities noted. Hallux limitus 1st MPJ  Right foot.  Skin  normotropic skin with no porokeratosis noted bilaterally.  No signs of infections or ulcers noted.     Onychomycosis  Pain in toes right foot  Pain in toes left foot  Debridement  of nails  1-5  B/L with a nail nipper.  Nails were then filed using a dremel tool with no incidents.    RTC 3 months    Gardiner Barefoot DPM

## 2019-09-21 ENCOUNTER — Other Ambulatory Visit: Payer: Self-pay | Admitting: Physician Assistant

## 2019-10-16 DIAGNOSIS — H353213 Exudative age-related macular degeneration, right eye, with inactive scar: Secondary | ICD-10-CM | POA: Diagnosis not present

## 2019-10-16 DIAGNOSIS — H2513 Age-related nuclear cataract, bilateral: Secondary | ICD-10-CM | POA: Diagnosis not present

## 2019-10-16 DIAGNOSIS — H52203 Unspecified astigmatism, bilateral: Secondary | ICD-10-CM | POA: Diagnosis not present

## 2019-10-18 ENCOUNTER — Ambulatory Visit (INDEPENDENT_AMBULATORY_CARE_PROVIDER_SITE_OTHER): Payer: Medicare HMO

## 2019-10-18 ENCOUNTER — Ambulatory Visit: Payer: Medicare HMO | Admitting: Podiatry

## 2019-10-18 ENCOUNTER — Encounter: Payer: Self-pay | Admitting: Podiatry

## 2019-10-18 ENCOUNTER — Other Ambulatory Visit: Payer: Self-pay

## 2019-10-18 DIAGNOSIS — C44622 Squamous cell carcinoma of skin of right upper limb, including shoulder: Secondary | ICD-10-CM | POA: Diagnosis not present

## 2019-10-18 DIAGNOSIS — M722 Plantar fascial fibromatosis: Secondary | ICD-10-CM | POA: Diagnosis not present

## 2019-10-18 DIAGNOSIS — M79671 Pain in right foot: Secondary | ICD-10-CM

## 2019-10-18 DIAGNOSIS — D485 Neoplasm of uncertain behavior of skin: Secondary | ICD-10-CM | POA: Diagnosis not present

## 2019-10-18 DIAGNOSIS — Z85828 Personal history of other malignant neoplasm of skin: Secondary | ICD-10-CM | POA: Diagnosis not present

## 2019-10-18 NOTE — Patient Instructions (Signed)

## 2019-10-18 NOTE — Progress Notes (Signed)
Subjective:  Patient ID: Ricardo Cooper, male    DOB: 07-Apr-1946,  MRN: [id number]  Chief Complaint  Patient presents with  . Plantar Fasciitis    right heel is hurting some     74 y.o. male presents with the above complaint.  Inferior heel pain present for about 1 month.  Last office visit was 09/18/2019 with Dr. Prudence Davidson, I was not present at that time.  He describes as a sore tenderness, refers to it as a "stone bruise".  He says he previously had had plantar fasciitis several years ago and this does not quite feel the same to him.  He took Mobic for about 3 days and this did help.   Review of Systems: Negative except as noted in the HPI. Denies N/V/F/Ch.  Past Medical History:  Diagnosis Date  . Allergic rhinitis   . Allergy   . Cataract    beginnings of cataracts   . COPD (chronic obstructive pulmonary disease) ([place])   . Diverticulosis   . GERD (gastroesophageal reflux disease)    no issues with CPAP-1999  . HTN (hypertension)   . Hyperlipidemia   . Obesity   . Obstructive sleep apnea on CPAP   . Paroxysmal atrial fibrillation ([place]) 2005   past trauma and infection  . Skin cancer    basel cell on nose,arm,leg  . Sleep apnea    with CPAP  . Ulcerative colitis   . Ulcerative colitis, left sided ([facility])     Current Outpatient Medications:  .  acetaminophen (TYLENOL) 500 MG tablet, Take 500 mg by mouth every 6 (six) hours as needed for mild pain., Disp: , Rfl:  .  albuterol (PROVENTIL HFA) 108 (90 BASE) MCG/ACT inhaler, Inhale 2 puffs into the lungs every 6 (six) hours as needed.  , Disp: , Rfl:  .  Coenzyme Q10 100 MG TABS, Take 100 mg by mouth daily. , Disp: , Rfl:  .  diltiazem (CARDIZEM CD) 180 MG 24 hr capsule, TAKE 1 CAPSULE BY MOUTH EVERY DAY, Disp: 90 capsule, Rfl: 3 .  ezetimibe (ZETIA) 10 MG tablet, Take 10 mg by mouth daily., Disp: , Rfl:  .  fish oil-omega-3 fatty acids 1000 MG capsule, Take 2 g by mouth daily.  , Disp: , Rfl:  .  fluorouracil (EFUDEX) 5 %  cream, APPLY TO AFFECTED AREAS TWICE A DAY X 3 WEEKS AS DIRECTED, Disp: , Rfl:  .  Fluticasone-Umeclidin-Vilant (TRELEGY ELLIPTA) 100-62.5-25 MCG/INH AEPB, Inhale 1 puff into the lungs daily., Disp: 60 each, Rfl: 12 .  folic acid (FOLVITE) 1 MG tablet, Take 1 tablet (1 mg total) by mouth daily., Disp: 90 tablet, Rfl: 2 .  losartan-hydrochlorothiazide (HYZAAR) 50-12.5 MG tablet, TAKE 1 TABLET BY MOUTH EVERY DAY, Disp: 90 tablet, Rfl: 3 .  Melatonin 5 MG TABS, Take 10 mg by mouth at bedtime. , Disp: , Rfl:  .  mercaptopurine (PURINETHOL) 50 MG tablet, Take 2 tablets (100 mg total) by mouth daily. Give on an empty stomach 1 hour before or 2 hours after meals. Caution: Chemotherapy., Disp: 180 tablet, Rfl: 1 .  pravastatin (PRAVACHOL) 20 MG tablet, Take 20 mg by mouth at bedtime., Disp: , Rfl: 4 .  sulfaSALAzine (AZULFIDINE) 500 MG EC tablet, TAKE 2 TABLETS BY MOUTH 3 TIMES A DAY, Disp: 180 tablet, Rfl: 2 .  vitamin B-12 (CYANOCOBALAMIN) 250 MCG tablet, Take 1,000 mcg by mouth daily. , Disp: , Rfl:  .  vitamin C (ASCORBIC ACID) 250 MG tablet, Take 500  mg by mouth daily., Disp: , Rfl:   Social History   Tobacco Use  Smoking Status Former Smoker  . Packs/day: 1.00  . Years: 35.00  . Pack years: 35.00  . Types: Cigarettes  . Quit date: 04/26/2001  . Years since quitting: 18.4  Smokeless Tobacco Former Systems developer  . Types: Chew  . Quit date: 06/25/1991  Tobacco Comment   quit in 2005    Allergies  Allergen Reactions  . Ibuprofen Other (See Comments)    REACTION: causes colitis flare Pt was told not to take Ibuprofen d/t reaction   Objective:  There were no vitals filed for this visit. There is no height or weight on file to calculate BMI. Constitutional Well developed. Well nourished.  Vascular Dorsalis pedis pulses palpable bilaterally. Posterior tibial pulses palpable bilaterally. Capillary refill normal to all digits.  No cyanosis or clubbing noted. Pedal hair growth normal.  Neurologic  Normal speech. Oriented to person, place, and time. Epicritic sensation to light touch grossly present bilaterally. Negative Tinel's sign right tarsal tunnel at the porta pedis, or over the lateral plantar nerve  Dermatologic Nails well groomed and normal in appearance. No open wounds. No skin lesions.  Orthopedic: Normal joint ROM without pain or crepitus bilaterally. No visible deformities. Tender to palpation at the calcaneal tuber right. No pain with calcaneal squeeze right. Ankle ROM full range of motion right. Silfverskiold Test: negative right.   Radiographs: Taken and reviewed. No acute fractures or dislocations. No evidence of stress fracture.  Plantar heel spur present. Posterior heel spur present.  Pes cavus foot type  Assessment:   1. Pain of right heel   2. Plantar fasciitis    Plan:  Patient was evaluated and treated and all questions answered.   Plantar Fasciitis, right - XR reviewed as above.  - Educated on icing and stretching. Instructions given.  -Today, on exam he only had minimal tenderness.  This is more intermittent and bothersome.  He did not feel like is required injection today and agree with this - DME: Night splint dispensed.  Plantar fascial brace dispensed - Pharmacologic management: Continue meloxicam. Educated on risks/benefits and proper taking of medication.  Lanae Crumbly, DPM 10/18/2019    Return in about 4 weeks (around 11/15/2019).

## 2019-11-15 ENCOUNTER — Other Ambulatory Visit: Payer: Self-pay

## 2019-11-15 ENCOUNTER — Ambulatory Visit: Payer: Medicare HMO | Admitting: Podiatry

## 2019-11-15 DIAGNOSIS — M722 Plantar fascial fibromatosis: Secondary | ICD-10-CM | POA: Diagnosis not present

## 2019-11-15 DIAGNOSIS — L988 Other specified disorders of the skin and subcutaneous tissue: Secondary | ICD-10-CM

## 2019-11-15 NOTE — Patient Instructions (Signed)

## 2019-11-19 NOTE — Progress Notes (Signed)
Subjective: 74 year old male presents the office today for follow-up evaluation of right heel pain, plantar fasciitis but he also has concerns of his left fifth toe it feels that there is a cracking sensation underneath the fifth toe.  Denies any open sores that he can see any drainage or swelling.  No redness.  In regards to the right side he has been using a brace which is been helping as well as icing and exercising.  Been taken the meloxicam and overall feeling better. Denies any systemic complaints such as fevers, chills, nausea, vomiting. No acute changes since last appointment, and no other complaints at this time.   Objective: AAO x3, NAD DP/PT pulses palpable bilaterally, CRT less than 3 seconds There is minimal tenderness palpation of plantar medial tubercle of the calcaneus at the insertion of plantar fascia on the right side.  Plantar fascial appears to be intact.  No pain to the Achilles tendon.  There is no pain with lateral compression of calcaneus. On the sulcus of the left hallux is a small amount of macerated tissue but there is no skin breakdown or open lesion.  There is no edema, erythema any drainage or pus. No open lesions or pre-ulcerative lesions.  No pain with calf compression, swelling, warmth, erythema  Assessment: Right heel pain, plantar fasciitis, left fifth toe maceration  Plan: -All treatment options discussed with the patient including all alternatives, risks, complications.  -In regards to the heel pain he is doing well.  Offered steroid injection but wishes to hold off on that today.  Continue stretching, icing, anti-inflammatories as needed as well as the bracing. -For the fifth toe cast on his pain was applied today.  Make sure dry thoroughly between the toes.  Monitoring skin breakdown or signs of infection. -Patient encouraged to call the office with any questions, concerns, change in symptoms.   Trula Slade DPM

## 2019-12-11 DIAGNOSIS — H353212 Exudative age-related macular degeneration, right eye, with inactive choroidal neovascularization: Secondary | ICD-10-CM | POA: Diagnosis not present

## 2019-12-11 DIAGNOSIS — H2513 Age-related nuclear cataract, bilateral: Secondary | ICD-10-CM | POA: Diagnosis not present

## 2019-12-11 DIAGNOSIS — H43823 Vitreomacular adhesion, bilateral: Secondary | ICD-10-CM | POA: Diagnosis not present

## 2019-12-18 ENCOUNTER — Other Ambulatory Visit: Payer: Self-pay | Admitting: Internal Medicine

## 2019-12-18 ENCOUNTER — Other Ambulatory Visit: Payer: Self-pay

## 2019-12-18 ENCOUNTER — Encounter: Payer: Self-pay | Admitting: Podiatry

## 2019-12-18 ENCOUNTER — Ambulatory Visit: Payer: Medicare HMO | Admitting: Podiatry

## 2019-12-18 DIAGNOSIS — B351 Tinea unguium: Secondary | ICD-10-CM

## 2019-12-18 DIAGNOSIS — M79675 Pain in left toe(s): Secondary | ICD-10-CM | POA: Diagnosis not present

## 2019-12-18 DIAGNOSIS — M79674 Pain in right toe(s): Secondary | ICD-10-CM | POA: Diagnosis not present

## 2019-12-18 NOTE — Progress Notes (Signed)
This patient presents  to the office for evaluation and treatment of long thick painful nails .  This patient is unable to trim his own nails since the patient cannot reach his feet.  Patient says the nails are painful walking and wearing his shoes.  He returns for preventive foot care services.  General Appearance  Alert, conversant and in no acute stress.  Vascular  Dorsalis pedis and posterior tibial  pulses are palpable  bilaterally.  Capillary return is within normal limits  bilaterally. Temperature is within normal limits  bilaterally.  Neurologic  Senn-Weinstein monofilament wire test within normal limits  Right.  LOPS  Diminished left foot.. Muscle power within normal limits bilaterally.  Nails Thick disfigured discolored nails with subungual debris  Hallux nails  B/L.Marland Kitchen No evidence of bacterial infection or drainage bilaterally.  Orthopedic  No limitations of motion  feet .  No crepitus or effusions noted.  No bony pathology or digital deformities noted. Hallux limitus 1st MPJ  Right foot.  Skin  normotropic skin with no porokeratosis noted bilaterally.  No signs of infections or ulcers noted.     Onychomycosis  Pain in toes right foot  Pain in toes left foot  Debridement  of nails  1-5  B/L with a nail nipper.  Nails were then filed using a dremel tool with no incidents.    RTC 3 months    Gardiner Barefoot DPM

## 2019-12-24 ENCOUNTER — Other Ambulatory Visit: Payer: Self-pay | Admitting: Internal Medicine

## 2019-12-25 ENCOUNTER — Other Ambulatory Visit: Payer: Self-pay | Admitting: Internal Medicine

## 2019-12-25 DIAGNOSIS — R911 Solitary pulmonary nodule: Secondary | ICD-10-CM

## 2019-12-27 ENCOUNTER — Other Ambulatory Visit: Payer: Self-pay

## 2019-12-27 ENCOUNTER — Ambulatory Visit
Admission: RE | Admit: 2019-12-27 | Discharge: 2019-12-27 | Disposition: A | Discharge status: Home / Self Care | Payer: Medicare HMO | Source: Ambulatory Visit | Attending: Internal Medicine | Admitting: Internal Medicine

## 2019-12-27 DIAGNOSIS — R911 Solitary pulmonary nodule: Secondary | ICD-10-CM

## 2019-12-27 DIAGNOSIS — I700 Atherosclerosis of aorta: Secondary | ICD-10-CM | POA: Diagnosis not present

## 2019-12-27 DIAGNOSIS — J9811 Atelectasis: Secondary | ICD-10-CM | POA: Diagnosis not present

## 2019-12-27 DIAGNOSIS — S2241XA Multiple fractures of ribs, right side, initial encounter for closed fracture: Secondary | ICD-10-CM | POA: Diagnosis not present

## 2019-12-27 DIAGNOSIS — I2510 Atherosclerotic heart disease of native coronary artery without angina pectoris: Secondary | ICD-10-CM | POA: Diagnosis not present

## 2020-01-01 DIAGNOSIS — R7301 Impaired fasting glucose: Secondary | ICD-10-CM | POA: Diagnosis not present

## 2020-01-01 DIAGNOSIS — Z125 Encounter for screening for malignant neoplasm of prostate: Secondary | ICD-10-CM | POA: Diagnosis not present

## 2020-01-01 DIAGNOSIS — G4733 Obstructive sleep apnea (adult) (pediatric): Secondary | ICD-10-CM | POA: Diagnosis not present

## 2020-01-01 DIAGNOSIS — E785 Hyperlipidemia, unspecified: Secondary | ICD-10-CM | POA: Diagnosis not present

## 2020-01-01 DIAGNOSIS — I10 Essential (primary) hypertension: Secondary | ICD-10-CM | POA: Diagnosis not present

## 2020-01-08 DIAGNOSIS — Z23 Encounter for immunization: Secondary | ICD-10-CM | POA: Diagnosis not present

## 2020-01-08 DIAGNOSIS — E8881 Metabolic syndrome: Secondary | ICD-10-CM | POA: Diagnosis not present

## 2020-01-08 DIAGNOSIS — I700 Atherosclerosis of aorta: Secondary | ICD-10-CM | POA: Diagnosis not present

## 2020-01-08 DIAGNOSIS — Z1331 Encounter for screening for depression: Secondary | ICD-10-CM | POA: Diagnosis not present

## 2020-01-08 DIAGNOSIS — D692 Other nonthrombocytopenic purpura: Secondary | ICD-10-CM | POA: Diagnosis not present

## 2020-01-08 DIAGNOSIS — I2510 Atherosclerotic heart disease of native coronary artery without angina pectoris: Secondary | ICD-10-CM | POA: Diagnosis not present

## 2020-01-08 DIAGNOSIS — E291 Testicular hypofunction: Secondary | ICD-10-CM | POA: Diagnosis not present

## 2020-01-08 DIAGNOSIS — E785 Hyperlipidemia, unspecified: Secondary | ICD-10-CM | POA: Diagnosis not present

## 2020-01-08 DIAGNOSIS — I714 Abdominal aortic aneurysm, without rupture: Secondary | ICD-10-CM | POA: Diagnosis not present

## 2020-01-08 DIAGNOSIS — Z1339 Encounter for screening examination for other mental health and behavioral disorders: Secondary | ICD-10-CM | POA: Diagnosis not present

## 2020-01-08 DIAGNOSIS — J449 Chronic obstructive pulmonary disease, unspecified: Secondary | ICD-10-CM | POA: Diagnosis not present

## 2020-01-08 DIAGNOSIS — R82998 Other abnormal findings in urine: Secondary | ICD-10-CM | POA: Diagnosis not present

## 2020-01-08 DIAGNOSIS — I10 Essential (primary) hypertension: Secondary | ICD-10-CM | POA: Diagnosis not present

## 2020-01-08 DIAGNOSIS — Z0000 Encounter for general adult medical examination without abnormal findings: Secondary | ICD-10-CM | POA: Diagnosis not present

## 2020-01-09 ENCOUNTER — Other Ambulatory Visit: Payer: Self-pay | Admitting: Internal Medicine

## 2020-01-14 ENCOUNTER — Ambulatory Visit: Payer: Medicare HMO | Admitting: Podiatry

## 2020-01-14 ENCOUNTER — Other Ambulatory Visit: Payer: Self-pay

## 2020-01-14 DIAGNOSIS — M722 Plantar fascial fibromatosis: Secondary | ICD-10-CM | POA: Diagnosis not present

## 2020-01-14 NOTE — Patient Instructions (Signed)

## 2020-01-18 DIAGNOSIS — M722 Plantar fascial fibromatosis: Secondary | ICD-10-CM | POA: Insufficient documentation

## 2020-01-18 NOTE — Progress Notes (Signed)
Subjective: 74 year old male presents the office today for concerns of right heel pain.  States that he is still having discomfort to the bottom of his heel.  Been stretching icing as well.  He denies any recent injury or trauma any changes since I last saw him. Denies any systemic complaints such as fevers, chills, nausea, vomiting. No acute changes since last appointment, and no other complaints at this time.   Objective: AAO x3, NAD DP/PT pulses palpable bilaterally, CRT less than 3 seconds There is continuation of tenderness on plantar medial tubercle of the calcaneus with insertion of plantar fascial right side.  Plantar fascial appears to be intact.  There is no pain with lateral compression of calcaneus or to the Achilles tendon.  Negative Tinel sign.  MMT 5/5. No pain with calf compression, swelling, warmth, erythema  Assessment: Right heel pain, plantar fasciitis  Plan: -All treatment options discussed with the patient including all alternatives, risks, complications.  -Steroid injection performed.  See procedure note below.  Continue stretching, icing exercises daily discussed shoe modifications and orthotics -Patient encouraged to call the office with any questions, concerns, change in symptoms.   Procedure: Injection Tendon/Ligament Discussed alternatives, risks, complications and verbal consent was obtained.  Location: Right plantar fascia at the glabrous junction; medial approach. Skin Prep: Alcohol  Injectate: 0.5cc 0.5% marcaine plain, 0.5 cc 2% lidocaine plain and, 1 cc kenalog 10. Disposition: Patient tolerated procedure well. Injection site dressed with a band-aid.  Post-injection care was discussed and return precautions discussed.   Return in about 4 weeks (around 02/11/2020) for plantar fasciitis .  Trula Slade DPM

## 2020-02-04 ENCOUNTER — Other Ambulatory Visit: Payer: Self-pay | Admitting: Internal Medicine

## 2020-02-12 ENCOUNTER — Ambulatory Visit: Payer: Medicare HMO | Admitting: Podiatry

## 2020-02-29 ENCOUNTER — Other Ambulatory Visit: Payer: Self-pay | Admitting: Physician Assistant

## 2020-03-03 ENCOUNTER — Telehealth (HOSPITAL_COMMUNITY): Payer: Self-pay | Admitting: Internal Medicine

## 2020-03-03 ENCOUNTER — Other Ambulatory Visit: Payer: Self-pay | Admitting: Internal Medicine

## 2020-03-05 DIAGNOSIS — D485 Neoplasm of uncertain behavior of skin: Secondary | ICD-10-CM | POA: Diagnosis not present

## 2020-03-05 DIAGNOSIS — L821 Other seborrheic keratosis: Secondary | ICD-10-CM | POA: Diagnosis not present

## 2020-03-05 DIAGNOSIS — C44622 Squamous cell carcinoma of skin of right upper limb, including shoulder: Secondary | ICD-10-CM | POA: Diagnosis not present

## 2020-03-05 DIAGNOSIS — Z85828 Personal history of other malignant neoplasm of skin: Secondary | ICD-10-CM | POA: Diagnosis not present

## 2020-03-10 DIAGNOSIS — H353211 Exudative age-related macular degeneration, right eye, with active choroidal neovascularization: Secondary | ICD-10-CM | POA: Diagnosis not present

## 2020-03-10 DIAGNOSIS — H2513 Age-related nuclear cataract, bilateral: Secondary | ICD-10-CM | POA: Diagnosis not present

## 2020-03-10 DIAGNOSIS — H43823 Vitreomacular adhesion, bilateral: Secondary | ICD-10-CM | POA: Diagnosis not present

## 2020-03-11 ENCOUNTER — Other Ambulatory Visit: Payer: Self-pay | Admitting: Physician Assistant

## 2020-03-11 DIAGNOSIS — H353211 Exudative age-related macular degeneration, right eye, with active choroidal neovascularization: Secondary | ICD-10-CM | POA: Diagnosis not present

## 2020-03-12 NOTE — Telephone Encounter (Signed)
error 

## 2020-03-18 ENCOUNTER — Ambulatory Visit: Payer: Medicare HMO | Admitting: Podiatry

## 2020-04-05 ENCOUNTER — Other Ambulatory Visit: Payer: Self-pay | Admitting: Internal Medicine

## 2020-04-11 DIAGNOSIS — H43823 Vitreomacular adhesion, bilateral: Secondary | ICD-10-CM | POA: Diagnosis not present

## 2020-04-11 DIAGNOSIS — H353211 Exudative age-related macular degeneration, right eye, with active choroidal neovascularization: Secondary | ICD-10-CM | POA: Diagnosis not present

## 2020-04-11 DIAGNOSIS — H2513 Age-related nuclear cataract, bilateral: Secondary | ICD-10-CM | POA: Diagnosis not present

## 2020-04-14 ENCOUNTER — Telehealth: Payer: Self-pay | Admitting: Physician Assistant

## 2020-04-14 MED ORDER — MESALAMINE 1.2 G PO TBEC: DELAYED_RELEASE_TABLET | ORAL | 11 refills | Status: DC

## 2020-04-14 NOTE — Telephone Encounter (Signed)
Inbound call from patient requesting a call back.  States he was previously [facility]  [facility] then [facility] to [facility] because [facility].  Now [facility] is telling patient medication is on back order.  He wants to know if there is an alternate medication he can take or what should he do.  Please advise.

## 2020-04-14 NOTE — Telephone Encounter (Signed)
Lialda 1.2gm, 2 po BID sent [facility] [place]. Pt has been informed.

## 2020-04-14 NOTE — Telephone Encounter (Signed)
Yes, let's switch to Lialda  1.2 gm, 2 po BID   #120/11-

## 2020-04-15 ENCOUNTER — Encounter (HOSPITAL_COMMUNITY): Payer: Self-pay | Admitting: Internal Medicine

## 2020-04-15 ENCOUNTER — Other Ambulatory Visit: Payer: Self-pay | Admitting: Internal Medicine

## 2020-04-15 ENCOUNTER — Ambulatory Visit (HOSPITAL_COMMUNITY)
Admission: RE | Admit: 2020-04-15 | Discharge: 2020-04-15 | Disposition: A | Discharge status: Home / Self Care | Payer: Medicare HMO | Source: Ambulatory Visit | Attending: Internal Medicine | Admitting: Internal Medicine

## 2020-04-15 ENCOUNTER — Other Ambulatory Visit: Payer: Self-pay

## 2020-04-15 VITALS — BP 160/88 | HR 73 | Wt 252.2 lb

## 2020-04-15 DIAGNOSIS — I10 Essential (primary) hypertension: Secondary | ICD-10-CM | POA: Diagnosis not present

## 2020-04-15 DIAGNOSIS — Z6835 Body mass index (BMI) 35.0-35.9, adult: Secondary | ICD-10-CM | POA: Insufficient documentation

## 2020-04-15 DIAGNOSIS — I2510 Atherosclerotic heart disease of native coronary artery without angina pectoris: Secondary | ICD-10-CM

## 2020-04-15 DIAGNOSIS — R911 Solitary pulmonary nodule: Secondary | ICD-10-CM | POA: Diagnosis not present

## 2020-04-15 DIAGNOSIS — C44519 Basal cell carcinoma of skin of other part of trunk: Secondary | ICD-10-CM | POA: Diagnosis not present

## 2020-04-15 DIAGNOSIS — I480 Paroxysmal atrial fibrillation: Secondary | ICD-10-CM | POA: Diagnosis not present

## 2020-04-15 DIAGNOSIS — J449 Chronic obstructive pulmonary disease, unspecified: Secondary | ICD-10-CM | POA: Diagnosis not present

## 2020-04-15 DIAGNOSIS — R0602 Shortness of breath: Secondary | ICD-10-CM | POA: Insufficient documentation

## 2020-04-15 DIAGNOSIS — E669 Obesity, unspecified: Secondary | ICD-10-CM | POA: Diagnosis not present

## 2020-04-15 DIAGNOSIS — G4733 Obstructive sleep apnea (adult) (pediatric): Secondary | ICD-10-CM | POA: Insufficient documentation

## 2020-04-15 DIAGNOSIS — E785 Hyperlipidemia, unspecified: Secondary | ICD-10-CM | POA: Insufficient documentation

## 2020-04-15 DIAGNOSIS — Z79899 Other long term (current) drug therapy: Secondary | ICD-10-CM | POA: Diagnosis not present

## 2020-04-15 DIAGNOSIS — L821 Other seborrheic keratosis: Secondary | ICD-10-CM | POA: Diagnosis not present

## 2020-04-15 DIAGNOSIS — K5190 Ulcerative colitis, unspecified, without complications: Secondary | ICD-10-CM | POA: Diagnosis not present

## 2020-04-15 DIAGNOSIS — Z85828 Personal history of other malignant neoplasm of skin: Secondary | ICD-10-CM | POA: Diagnosis not present

## 2020-04-15 DIAGNOSIS — Z7983 Long term (current) use of bisphosphonates: Secondary | ICD-10-CM | POA: Insufficient documentation

## 2020-04-15 DIAGNOSIS — D485 Neoplasm of uncertain behavior of skin: Secondary | ICD-10-CM | POA: Diagnosis not present

## 2020-04-15 DIAGNOSIS — D045 Carcinoma in situ of skin of trunk: Secondary | ICD-10-CM | POA: Diagnosis not present

## 2020-04-15 DIAGNOSIS — L570 Actinic keratosis: Secondary | ICD-10-CM | POA: Diagnosis not present

## 2020-04-15 DIAGNOSIS — D1801 Hemangioma of skin and subcutaneous tissue: Secondary | ICD-10-CM | POA: Diagnosis not present

## 2020-04-15 HISTORY — DX: Heart failure, unspecified: I50.9

## 2020-04-15 NOTE — Progress Notes (Signed)
ReDS Vest / Clip - 04/15/20 1300      ReDS Vest / Clip   Station Marker D    Ruler Value 36    ReDS Value Range Low volume    ReDS Actual Value 34

## 2020-04-15 NOTE — Progress Notes (Signed)
PCP:  Dr. Lang Snow HF cardiologist: Dr. Haroldine Laws Pulmonary MD: Dr. Lamonte Sakai Sleep Medicine MD: Dr. Annamaria Boots  ADVANCED HF CLINIC NOTE  HPI: Ricardo Cooper is a very pleasant 74 year old male who is a father of [name]. He has a history of obstructive sleep apnea on CPAP, obesity, hypertension, COPD, ulcerative colitis and a history of traumatic accident in 2005 with a burst fracture of L1 complicated by sternal wound infection and atrial fibrillation, which has not recurred.Previous myoview normal. Returns for routine f/u.   Had ETT 1/12. Walked 7:30. Normal ECG. Spirometry 12/14  FEV1 2.27 (62%) FVC 3.44 (72%)  FEF25%-75% (40%) Calcium scoring CT 6/15: Calcium score 0  Follows with Dr. Annamaria Boots for OSA and Dr. Lamonte Sakai for Pulmonary.   Seen in clinic 4/18, had not seen him for almost 3 years. Gets SOB with mild activity. No change. No CP, edema, orthopnea or PND. No palpitations. Lipids followed by Dr. Brigitte Pulse. Had trouble with crestor and generic atorva. Now on pravastatin.   Seen in clinic 9/19 for follow up. BP medications refilled and patient was doing well.   Here today for follow up with his wife. He is feeling fine, breathing is about at baseline. Gets SOB walking up stairs or an incline. Able to do yard work without much limitation. Sees Dr. Lamonte Sakai for COPD. Wears cpap every night, followed by Dr. Annamaria Boots. No increasing SOB, swelling, CP, palpitations, PND/orthopnea. BPs at home ~95-621 systolic.     ROS: All systems negative except as listed in HPI, PMH and Problem List.  Past Medical History:  Diagnosis Date  . Allergic rhinitis   . Allergy   . Cataract    beginnings of cataracts   . CHF (congestive heart failure) ([place])   . COPD (chronic obstructive pulmonary disease) ([place])   . Diverticulosis   . GERD (gastroesophageal reflux disease)    no issues with CPAP-1999  . HTN (hypertension)   . Hyperlipidemia   . Obesity   . Obstructive sleep apnea on CPAP   . Paroxysmal atrial  fibrillation ([place]) 2005   past trauma and infection  . Skin cancer    basel cell on nose,arm,leg  . Sleep apnea    with CPAP  . Ulcerative colitis   . Ulcerative colitis, left sided [facility])     Current Outpatient Medications  Medication Sig Dispense Refill  . acetaminophen (TYLENOL) 500 MG tablet Take 500 mg by mouth every 6 (six) hours as needed for mild pain.    Marland Kitchen albuterol (VENTOLIN HFA) 108 (90 Base) MCG/ACT inhaler Inhale 2 puffs into the lungs every 6 (six) hours as needed.    . Coenzyme Q10 100 MG TABS Take 100 mg by mouth daily.     Marland Kitchen diltiazem (CARDIZEM CD) 180 MG 24 hr capsule Take 1 capsule (180 mg total) by mouth daily. Must keep appointment for refills 90 capsule 0  . ezetimibe (ZETIA) 10 MG tablet Take 10 mg by mouth daily.    . fish oil-omega-3 fatty acids 1000 MG capsule Take 2 g by mouth daily.    . Fluticasone-Umeclidin-Vilant (TRELEGY ELLIPTA) 100-62.5-25 MCG/INH AEPB Inhale 1 puff into the lungs daily. 60 each 12  . folic acid (FOLVITE) 1 MG tablet Take 1 tablet (1 mg total) by mouth daily. 90 tablet 2  . losartan-hydrochlorothiazide (HYZAAR) 50-12.5 MG tablet TAKE 1 TABLET BY MOUTH DAILY. MUST BE SEEN IN OFFICE FOR FURTHER REFILLS 30 tablet 0  . Melatonin 5 MG TABS Take 10 mg by mouth at  bedtime.     . mercaptopurine (PURINETHOL) 50 MG tablet TAKE 2 TABLETS (100 MG TOTAL) BY MOUTH DAILY. GIVE ON AN EMPTY STOMACH 1 HOUR BEFORE OR 2 HOURS AFTER MEALS. CAUTION: CHEMOTHERAPY. 180 tablet 0  . mesalamine (LIALDA) 1.2 g EC tablet Take 2 PO BID 120 tablet 11  . NIACINAMIDE PO Take 1 capsule by mouth 2 (two) times daily.    . pravastatin (PRAVACHOL) 20 MG tablet Take 20 mg by mouth at bedtime.  4  . vitamin B-12 (CYANOCOBALAMIN) 250 MCG tablet Take 1,000 mcg by mouth daily.    . vitamin C (ASCORBIC ACID) 250 MG tablet Take 500 mg by mouth daily.     No current facility-administered medications for this encounter.   Vitals:   04/15/20 1234  BP: (!) 160/88  Pulse: 73  SpO2:  95%  Weight: 114.4 kg (252 lb 3.2 oz)   Wt Readings from Last 3 Encounters:  04/15/20 114.4 kg (252 lb 3.2 oz)  06/12/19 113.8 kg (250 lb 12.8 oz)  05/16/19 113.7 kg (250 lb 9.6 oz)    PHYSICAL EXAM General:  Well appearing. No resp difficulty. HEENT: normal Neck: supple. no JVD. Carotids 2+ bilat; no bruits. No lymphadenopathy or thryomegaly appreciated. Cor: PMI nondisplaced. Regular rate & rhythm. Very brief SEM RSB, S2 ok. Lungs: clear, diminished in bases Abdomen: obese, soft, nontender, nondistended. No hepatosplenomegaly. No bruits or masses. Good bowel sounds. Extremities: no cyanosis, clubbing, rash. Trace left pedal edema (baseline). Neuro: alert & oriented x 3, cranial nerves grossly intact. moves all 4 extremities w/o difficulty. Affect pleasant.  ReDs: 34% ECG: NSR 79 with occasional PACs, iRBBB (personally reviewed)   ASSESSMENT & PLAN:  1. HTN - Elevated today, but just came from derm who biopsied skin lesions. - Continue diltiazem 180 mg daily. - Continue losartan-hctz 50-12.5 mg daily. - Continue to check BP at home (goal SBP <130).  2. Hyperlipidemia  - Continue pravastatin 20 mg daily + Zetia 10 mg daily. - Lipid checked & followed by Dr. Brigitte Pulse.   3. Cardiac risk prevention  - calcium score 0 in 6/15. Continue RF management . - CT chest 9/21 showed small calcium deposits. Stable.  4. Pulmonary nodule - followed by Dr.Byrum. Stable by CT 9/21.   5. PAF - Isolated incident in post-trauma setting. Has not recurred.  - SR on ECG today, no complaints of palpitations.  6. Obesity Body mass index is 35.17 kg/m.  - Encouraged [employer]. - Stay active.   Glori Bickers, MD 2:14 PM

## 2020-04-15 NOTE — Patient Instructions (Addendum)
Please call our office in December 2022 to schedule follow up appointment  If you have any questions or concerns before your next appointment please send [place] a message through [place] or call our office at [phone].    TO LEAVE A MESSAGE FOR THE NURSE SELECT OPTION 2, PLEASE LEAVE A MESSAGE INCLUDING: . YOUR NAME . DATE OF BIRTH . CALL BACK NUMBER . REASON FOR CALL**this is important as we prioritize the call backs  YOU WILL RECEIVE A CALL BACK THE SAME DAY AS LONG AS YOU CALL BEFORE 4:00 PM

## 2020-04-16 NOTE — Telephone Encounter (Signed)
Returned call to pt- he states that pharmacy did have rx, but had to order it. Rx has been filled.

## 2020-04-16 NOTE — Telephone Encounter (Signed)
Pt is requesting a call back from a nurse, [facility] the [place].

## 2020-04-21 ENCOUNTER — Other Ambulatory Visit: Payer: Self-pay | Admitting: Internal Medicine

## 2020-04-21 ENCOUNTER — Other Ambulatory Visit: Payer: Self-pay | Admitting: Physician Assistant

## 2020-05-01 DIAGNOSIS — I4891 Unspecified atrial fibrillation: Secondary | ICD-10-CM | POA: Diagnosis not present

## 2020-05-01 DIAGNOSIS — K5190 Ulcerative colitis, unspecified, without complications: Secondary | ICD-10-CM | POA: Diagnosis not present

## 2020-05-01 DIAGNOSIS — E785 Hyperlipidemia, unspecified: Secondary | ICD-10-CM | POA: Diagnosis not present

## 2020-05-01 DIAGNOSIS — M1990 Unspecified osteoarthritis, unspecified site: Secondary | ICD-10-CM | POA: Diagnosis not present

## 2020-05-01 DIAGNOSIS — J439 Emphysema, unspecified: Secondary | ICD-10-CM | POA: Diagnosis not present

## 2020-05-01 DIAGNOSIS — E669 Obesity, unspecified: Secondary | ICD-10-CM | POA: Diagnosis not present

## 2020-05-01 DIAGNOSIS — D6869 Other thrombophilia: Secondary | ICD-10-CM | POA: Diagnosis not present

## 2020-05-01 DIAGNOSIS — G4733 Obstructive sleep apnea (adult) (pediatric): Secondary | ICD-10-CM | POA: Diagnosis not present

## 2020-05-01 DIAGNOSIS — I10 Essential (primary) hypertension: Secondary | ICD-10-CM | POA: Diagnosis not present

## 2020-05-01 DIAGNOSIS — N400 Enlarged prostate without lower urinary tract symptoms: Secondary | ICD-10-CM | POA: Diagnosis not present

## 2020-05-13 ENCOUNTER — Other Ambulatory Visit: Payer: Self-pay | Admitting: Internal Medicine

## 2020-05-13 DIAGNOSIS — H353211 Exudative age-related macular degeneration, right eye, with active choroidal neovascularization: Secondary | ICD-10-CM | POA: Diagnosis not present

## 2020-05-13 DIAGNOSIS — H2513 Age-related nuclear cataract, bilateral: Secondary | ICD-10-CM | POA: Diagnosis not present

## 2020-05-13 DIAGNOSIS — H43823 Vitreomacular adhesion, bilateral: Secondary | ICD-10-CM | POA: Diagnosis not present

## 2020-05-13 DIAGNOSIS — H35362 Drusen (degenerative) of macula, left eye: Secondary | ICD-10-CM | POA: Diagnosis not present

## 2020-05-17 ENCOUNTER — Other Ambulatory Visit: Payer: Self-pay | Admitting: Emergency Medicine

## 2020-05-17 DIAGNOSIS — J449 Chronic obstructive pulmonary disease, unspecified: Secondary | ICD-10-CM

## 2020-05-25 IMAGING — CT CT CHEST LUNG CANCER SCREENING LOW DOSE W/O CM
1 of 3 series · 10 of 41 positions shown, 13 images · non-contrast
Comparison: 04/07/2017 screening chest CT.

CLINICAL DATA: 72-year-old asymptomatic male former smoker with 53
pack-year smoking history.

EXAM:
CT CHEST WITHOUT CONTRAST LOW-DOSE FOR LUNG CANCER SCREENING
TECHNIQUE: Multidetector CT imaging of the chest was performed following the
standard protocol without IV contrast.

[ct lung segmentation data · axial · 0.81mm/px · z∈[-406,-406]mm · 10 of 393 frames shown]
[frame 1/393  mediastinal]
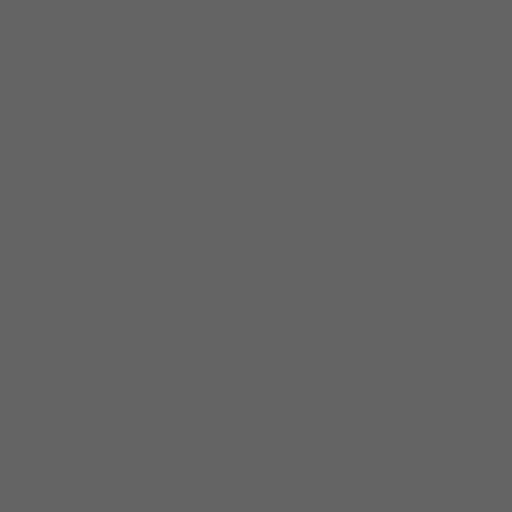
[frame 1/393  lung]
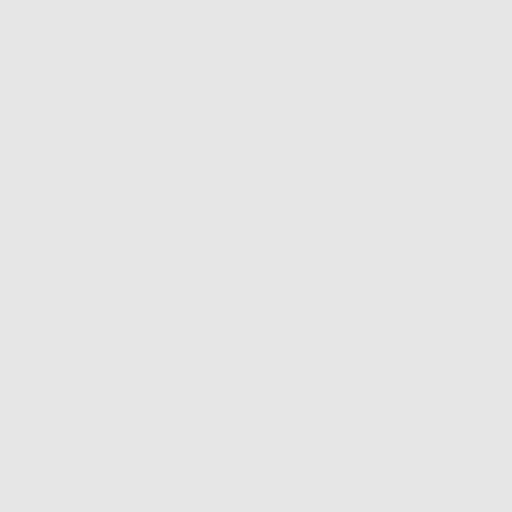
[frame 44/393  lung]
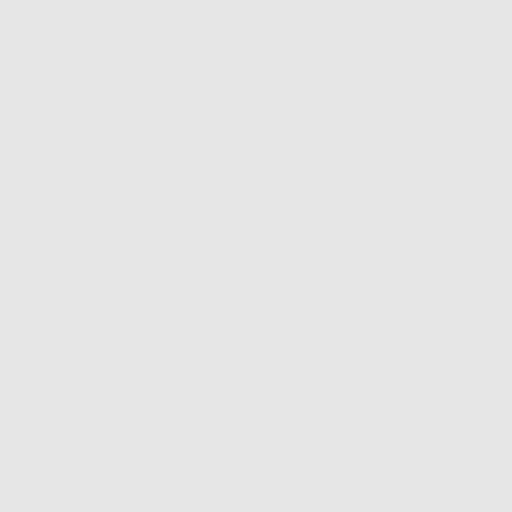
[frame 88/393  lung]
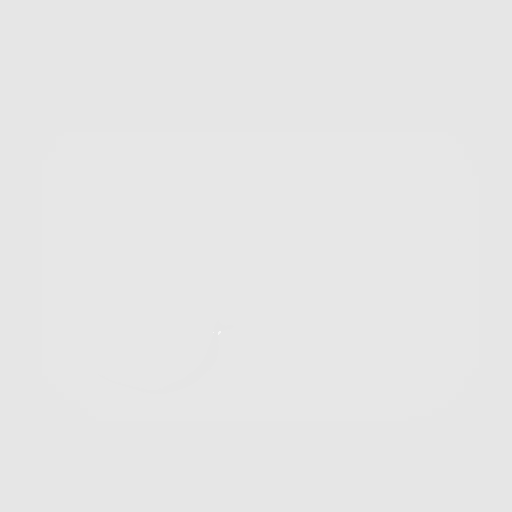
[frame 131/393  lung]
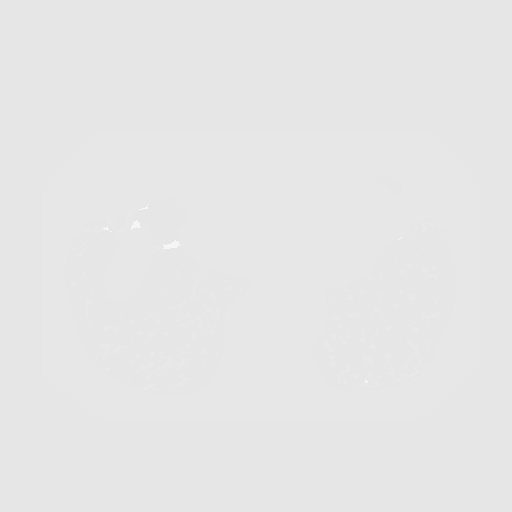
[frame 175/393  mediastinal]
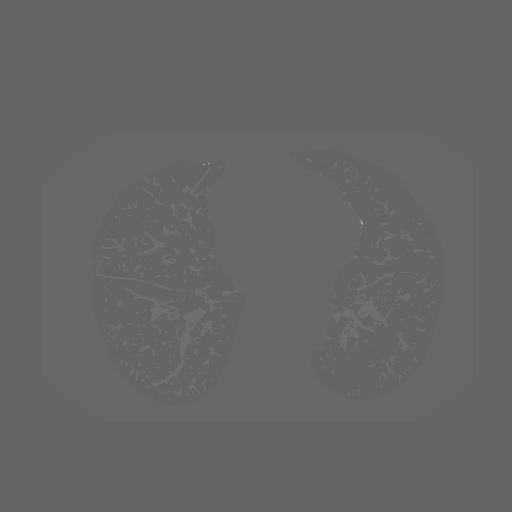
[frame 175/393  lung]
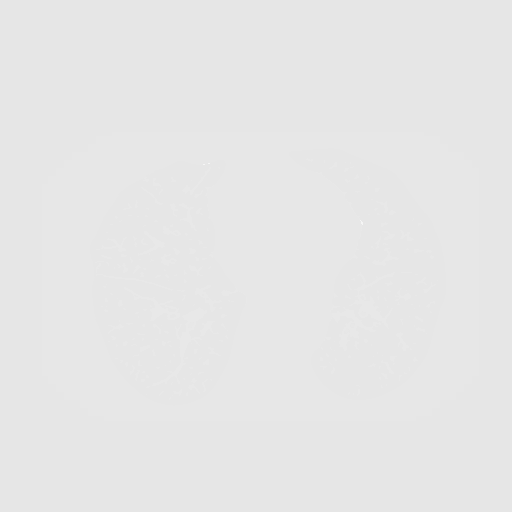
[frame 218/393  lung]
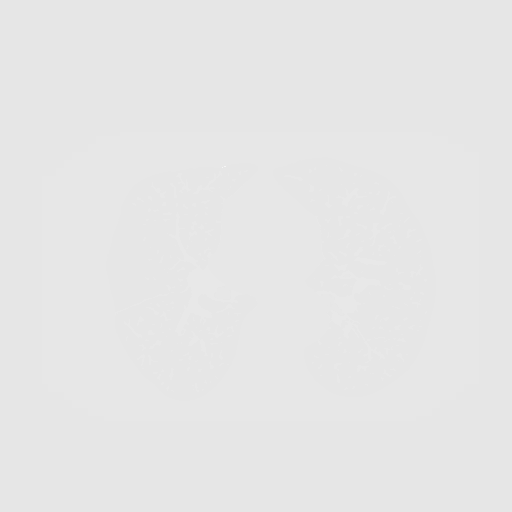
[frame 262/393  lung]
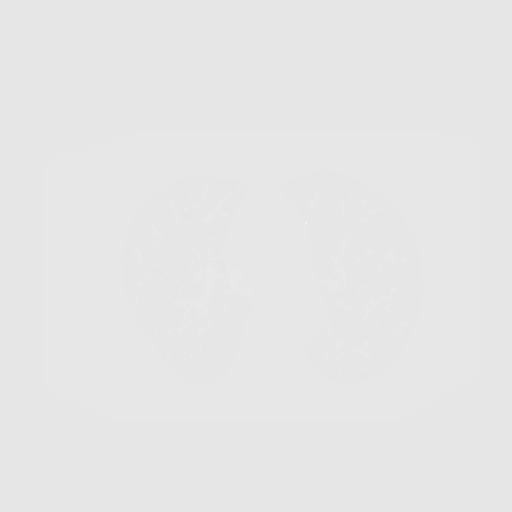
[frame 305/393  lung]
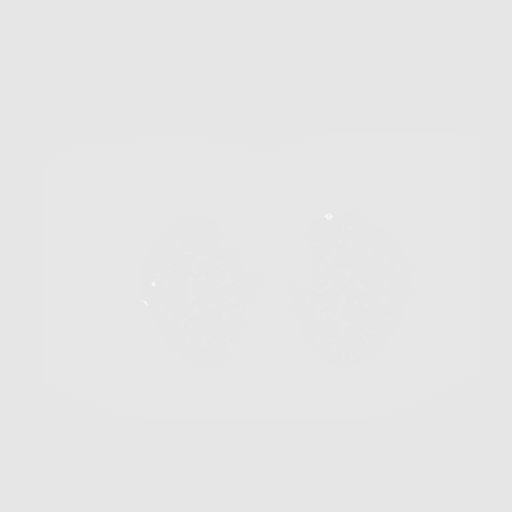
[frame 349/393  mediastinal]
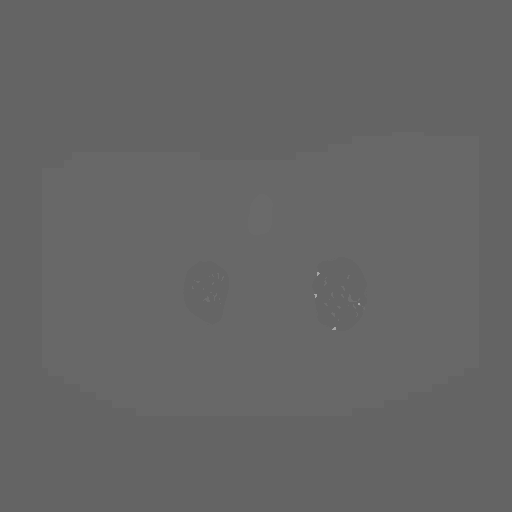
[frame 349/393  lung]
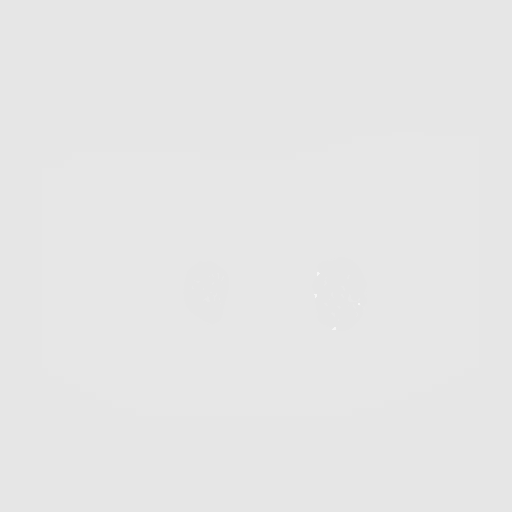
[frame 393/393  lung]
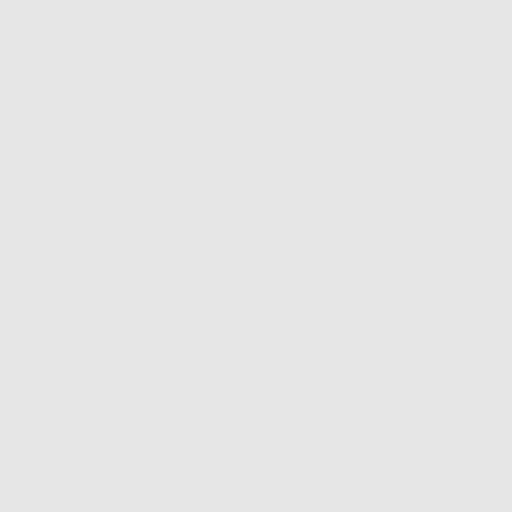

[10 of 41 positions shown; findings below may reference images not displayed]

FINDINGS: Cardiovascular: Normal heart size. No significant pericardial
effusion/thickening. Atherosclerotic nonaneurysmal thoracic aorta.
Normal caliber pulmonary arteries.

Mediastinum/Nodes: No discrete thyroid nodules. Unremarkable
esophagus. No pathologically enlarged axillary, mediastinal or hilar
lymph nodes, noting limited sensitivity for the detection of hilar
adenopathy on this noncontrast study.

Lungs/Pleura: No pneumothorax. No pleural effusion. Mild
centrilobular emphysema. No acute consolidative airspace disease or
lung masses. No significant growth of any of the previously
visualized numerous scattered pulmonary nodules in both lungs.
Several new solid pulmonary nodules scattered in both lungs, largest
7.5 mm in volume derived mean diameter in the right middle lobe
(series 3/image 209).

Upper abdomen: Cholelithiasis.  Stable left parapelvic renal cysts.

Musculoskeletal: No aggressive appearing focal osseous lesions.
Healed deformities are again noted within multiple posterolateral
right ribs. Postsurgical changes from left spinal fusion T12-L2.
Marked thoracic spondylosis.
IMPRESSION: 1. Lung-RADS 4A, suspicious. Several new pulmonary nodules scattered
in both lungs, more likely infectious/inflammatory. Follow up
low-dose chest CT without contrast in 3 months (please use the
following order, "CT CHEST LCS NODULE FOLLOW-UP W/O CM") is
recommended.
2. Cholelithiasis.

Aortic Atherosclerosis (48S6N-XG4.4) and Emphysema (48S6N-9GR.S).

These results will be called to the ordering clinician or
representative by the Radiologist Assistant, and communication
documented in the PACS or zVision Dashboard.

## 2020-06-05 ENCOUNTER — Other Ambulatory Visit: Payer: Self-pay | Admitting: Internal Medicine

## 2020-06-25 DIAGNOSIS — H43823 Vitreomacular adhesion, bilateral: Secondary | ICD-10-CM | POA: Diagnosis not present

## 2020-06-25 DIAGNOSIS — H353211 Exudative age-related macular degeneration, right eye, with active choroidal neovascularization: Secondary | ICD-10-CM | POA: Diagnosis not present

## 2020-06-25 DIAGNOSIS — H2513 Age-related nuclear cataract, bilateral: Secondary | ICD-10-CM | POA: Diagnosis not present

## 2020-06-25 DIAGNOSIS — H35362 Drusen (degenerative) of macula, left eye: Secondary | ICD-10-CM | POA: Diagnosis not present

## 2020-07-01 DIAGNOSIS — G4733 Obstructive sleep apnea (adult) (pediatric): Secondary | ICD-10-CM | POA: Diagnosis not present

## 2020-07-02 ENCOUNTER — Other Ambulatory Visit: Payer: Self-pay | Admitting: Internal Medicine

## 2020-07-14 ENCOUNTER — Telehealth: Payer: Self-pay | Admitting: Physician Assistant

## 2020-07-14 NOTE — Telephone Encounter (Signed)
Inbound call from patient requesting to switch from mesalamine back to sulfasalazine.  Please advise.

## 2020-07-16 NOTE — Telephone Encounter (Addendum)
Per Dr.Pyrtle okay for pt to switch back to Sulfasalazine. Will need to know which pharmacy pt would like rx to sent. I have called and left detailed message for pt.

## 2020-07-17 ENCOUNTER — Other Ambulatory Visit: Payer: Self-pay | Admitting: Physician Assistant

## 2020-07-17 MED ORDER — SULFASALAZINE 500 MG PO TABS: ORAL_TABLET | ORAL | 2 refills | Status: DC

## 2020-07-17 NOTE — Telephone Encounter (Signed)
Rx for Sulfasalazine 524m 2 tabs TID [facility] [address]. Pt has been informed.

## 2020-07-24 DIAGNOSIS — E785 Hyperlipidemia, unspecified: Secondary | ICD-10-CM | POA: Diagnosis not present

## 2020-07-24 DIAGNOSIS — K5190 Ulcerative colitis, unspecified, without complications: Secondary | ICD-10-CM | POA: Diagnosis not present

## 2020-07-24 DIAGNOSIS — I10 Essential (primary) hypertension: Secondary | ICD-10-CM | POA: Diagnosis not present

## 2020-07-24 DIAGNOSIS — J449 Chronic obstructive pulmonary disease, unspecified: Secondary | ICD-10-CM | POA: Diagnosis not present

## 2020-07-24 DIAGNOSIS — N400 Enlarged prostate without lower urinary tract symptoms: Secondary | ICD-10-CM | POA: Diagnosis not present

## 2020-07-25 DIAGNOSIS — H35362 Drusen (degenerative) of macula, left eye: Secondary | ICD-10-CM | POA: Diagnosis not present

## 2020-07-25 DIAGNOSIS — H353211 Exudative age-related macular degeneration, right eye, with active choroidal neovascularization: Secondary | ICD-10-CM | POA: Diagnosis not present

## 2020-07-25 DIAGNOSIS — H2513 Age-related nuclear cataract, bilateral: Secondary | ICD-10-CM | POA: Diagnosis not present

## 2020-07-25 DIAGNOSIS — H43823 Vitreomacular adhesion, bilateral: Secondary | ICD-10-CM | POA: Diagnosis not present

## 2020-08-11 DIAGNOSIS — L570 Actinic keratosis: Secondary | ICD-10-CM | POA: Diagnosis not present

## 2020-08-11 DIAGNOSIS — D1801 Hemangioma of skin and subcutaneous tissue: Secondary | ICD-10-CM | POA: Diagnosis not present

## 2020-08-11 DIAGNOSIS — D045 Carcinoma in situ of skin of trunk: Secondary | ICD-10-CM | POA: Diagnosis not present

## 2020-08-11 DIAGNOSIS — Z85828 Personal history of other malignant neoplasm of skin: Secondary | ICD-10-CM | POA: Diagnosis not present

## 2020-08-11 DIAGNOSIS — L821 Other seborrheic keratosis: Secondary | ICD-10-CM | POA: Diagnosis not present

## 2020-08-11 DIAGNOSIS — B078 Other viral warts: Secondary | ICD-10-CM | POA: Diagnosis not present

## 2020-08-11 DIAGNOSIS — L910 Hypertrophic scar: Secondary | ICD-10-CM | POA: Diagnosis not present

## 2020-08-11 DIAGNOSIS — D485 Neoplasm of uncertain behavior of skin: Secondary | ICD-10-CM | POA: Diagnosis not present

## 2020-08-13 ENCOUNTER — Other Ambulatory Visit (HOSPITAL_COMMUNITY): Payer: Self-pay | Admitting: *Deleted

## 2020-08-13 ENCOUNTER — Telehealth: Payer: Self-pay | Admitting: Physician Assistant

## 2020-08-13 MED ORDER — MERCAPTOPURINE 50 MG PO TABS: 100.0000 mg | ORAL_TABLET | Freq: Every day | ORAL | 1 refills | Status: DC

## 2020-08-13 MED ORDER — DILTIAZEM HCL ER COATED BEADS 180 MG PO CP24: 180.0000 mg | ORAL_CAPSULE | Freq: Every day | ORAL | 3 refills | Status: DC

## 2020-08-13 MED ORDER — SULFASALAZINE 500 MG PO TABS: ORAL_TABLET | ORAL | 1 refills | Status: DC

## 2020-08-13 MED ORDER — LOSARTAN POTASSIUM-HCTZ 50-12.5 MG PO TABS: 1.0000 | ORAL_TABLET | Freq: Every day | ORAL | 11 refills | Status: DC

## 2020-08-13 NOTE — Telephone Encounter (Signed)
Prescriptions have been sent to [facility]. Pt has been informed.

## 2020-08-13 NOTE — Telephone Encounter (Signed)
[facility] following up about the scripts being sent to [facility].

## 2020-08-22 DIAGNOSIS — H2513 Age-related nuclear cataract, bilateral: Secondary | ICD-10-CM | POA: Diagnosis not present

## 2020-08-22 DIAGNOSIS — H353211 Exudative age-related macular degeneration, right eye, with active choroidal neovascularization: Secondary | ICD-10-CM | POA: Diagnosis not present

## 2020-08-22 DIAGNOSIS — H35362 Drusen (degenerative) of macula, left eye: Secondary | ICD-10-CM | POA: Diagnosis not present

## 2020-08-23 DIAGNOSIS — E785 Hyperlipidemia, unspecified: Secondary | ICD-10-CM | POA: Diagnosis not present

## 2020-08-23 DIAGNOSIS — J449 Chronic obstructive pulmonary disease, unspecified: Secondary | ICD-10-CM | POA: Diagnosis not present

## 2020-08-23 DIAGNOSIS — I10 Essential (primary) hypertension: Secondary | ICD-10-CM | POA: Diagnosis not present

## 2020-08-23 DIAGNOSIS — I2510 Atherosclerotic heart disease of native coronary artery without angina pectoris: Secondary | ICD-10-CM | POA: Diagnosis not present

## 2020-10-04 ENCOUNTER — Other Ambulatory Visit: Payer: Self-pay | Admitting: Physician Assistant

## 2020-10-10 DIAGNOSIS — H2513 Age-related nuclear cataract, bilateral: Secondary | ICD-10-CM | POA: Diagnosis not present

## 2020-10-10 DIAGNOSIS — H43823 Vitreomacular adhesion, bilateral: Secondary | ICD-10-CM | POA: Diagnosis not present

## 2020-10-10 DIAGNOSIS — H35362 Drusen (degenerative) of macula, left eye: Secondary | ICD-10-CM | POA: Diagnosis not present

## 2020-10-10 DIAGNOSIS — H353211 Exudative age-related macular degeneration, right eye, with active choroidal neovascularization: Secondary | ICD-10-CM | POA: Diagnosis not present

## 2020-10-12 ENCOUNTER — Other Ambulatory Visit: Payer: Self-pay | Admitting: Physician Assistant

## 2020-10-13 ENCOUNTER — Other Ambulatory Visit: Payer: Self-pay | Admitting: Physician Assistant

## 2020-10-21 DIAGNOSIS — H524 Presbyopia: Secondary | ICD-10-CM | POA: Diagnosis not present

## 2020-10-21 DIAGNOSIS — H2513 Age-related nuclear cataract, bilateral: Secondary | ICD-10-CM | POA: Diagnosis not present

## 2020-10-21 DIAGNOSIS — H5203 Hypermetropia, bilateral: Secondary | ICD-10-CM | POA: Diagnosis not present

## 2020-11-07 DIAGNOSIS — H43823 Vitreomacular adhesion, bilateral: Secondary | ICD-10-CM | POA: Diagnosis not present

## 2020-11-07 DIAGNOSIS — H353211 Exudative age-related macular degeneration, right eye, with active choroidal neovascularization: Secondary | ICD-10-CM | POA: Diagnosis not present

## 2020-11-07 DIAGNOSIS — H2513 Age-related nuclear cataract, bilateral: Secondary | ICD-10-CM | POA: Diagnosis not present

## 2020-11-07 DIAGNOSIS — H35362 Drusen (degenerative) of macula, left eye: Secondary | ICD-10-CM | POA: Diagnosis not present

## 2020-11-19 ENCOUNTER — Telehealth: Payer: Self-pay | Admitting: Physician Assistant

## 2020-11-19 MED ORDER — FOLIC ACID 1 MG PO TABS: 1.0000 mg | ORAL_TABLET | Freq: Every day | ORAL | 0 refills | Status: DC

## 2020-11-19 NOTE — Telephone Encounter (Signed)
Folic Acid to be sent to [facility]

## 2020-11-19 NOTE — Telephone Encounter (Signed)
Refill has been sent in

## 2020-11-23 DIAGNOSIS — E785 Hyperlipidemia, unspecified: Secondary | ICD-10-CM | POA: Diagnosis not present

## 2020-11-23 DIAGNOSIS — J449 Chronic obstructive pulmonary disease, unspecified: Secondary | ICD-10-CM | POA: Diagnosis not present

## 2020-11-23 DIAGNOSIS — I10 Essential (primary) hypertension: Secondary | ICD-10-CM | POA: Diagnosis not present

## 2020-11-23 DIAGNOSIS — I2510 Atherosclerotic heart disease of native coronary artery without angina pectoris: Secondary | ICD-10-CM | POA: Diagnosis not present

## 2020-11-24 DIAGNOSIS — Z85828 Personal history of other malignant neoplasm of skin: Secondary | ICD-10-CM | POA: Diagnosis not present

## 2020-11-24 DIAGNOSIS — D1801 Hemangioma of skin and subcutaneous tissue: Secondary | ICD-10-CM | POA: Diagnosis not present

## 2020-11-24 DIAGNOSIS — L820 Inflamed seborrheic keratosis: Secondary | ICD-10-CM | POA: Diagnosis not present

## 2020-11-24 DIAGNOSIS — L821 Other seborrheic keratosis: Secondary | ICD-10-CM | POA: Diagnosis not present

## 2020-11-24 DIAGNOSIS — C44619 Basal cell carcinoma of skin of left upper limb, including shoulder: Secondary | ICD-10-CM | POA: Diagnosis not present

## 2020-12-05 DIAGNOSIS — H353121 Nonexudative age-related macular degeneration, left eye, early dry stage: Secondary | ICD-10-CM | POA: Diagnosis not present

## 2020-12-05 DIAGNOSIS — H353211 Exudative age-related macular degeneration, right eye, with active choroidal neovascularization: Secondary | ICD-10-CM | POA: Diagnosis not present

## 2020-12-05 DIAGNOSIS — H43823 Vitreomacular adhesion, bilateral: Secondary | ICD-10-CM | POA: Diagnosis not present

## 2020-12-24 ENCOUNTER — Other Ambulatory Visit: Payer: Self-pay | Admitting: Physician Assistant

## 2020-12-24 ENCOUNTER — Other Ambulatory Visit (HOSPITAL_COMMUNITY): Payer: Self-pay | Admitting: Internal Medicine

## 2020-12-24 DIAGNOSIS — E785 Hyperlipidemia, unspecified: Secondary | ICD-10-CM | POA: Diagnosis not present

## 2020-12-24 DIAGNOSIS — I10 Essential (primary) hypertension: Secondary | ICD-10-CM | POA: Diagnosis not present

## 2020-12-24 DIAGNOSIS — I2510 Atherosclerotic heart disease of native coronary artery without angina pectoris: Secondary | ICD-10-CM | POA: Diagnosis not present

## 2020-12-24 DIAGNOSIS — J449 Chronic obstructive pulmonary disease, unspecified: Secondary | ICD-10-CM | POA: Diagnosis not present

## 2020-12-26 ENCOUNTER — Other Ambulatory Visit (HOSPITAL_COMMUNITY): Payer: Self-pay | Admitting: *Deleted

## 2020-12-26 MED ORDER — DILTIAZEM HCL ER COATED BEADS 180 MG PO CP24: 180.0000 mg | ORAL_CAPSULE | Freq: Every day | ORAL | 3 refills | Status: DC

## 2021-01-02 DIAGNOSIS — H2513 Age-related nuclear cataract, bilateral: Secondary | ICD-10-CM | POA: Diagnosis not present

## 2021-01-02 DIAGNOSIS — H353121 Nonexudative age-related macular degeneration, left eye, early dry stage: Secondary | ICD-10-CM | POA: Diagnosis not present

## 2021-01-02 DIAGNOSIS — H43823 Vitreomacular adhesion, bilateral: Secondary | ICD-10-CM | POA: Diagnosis not present

## 2021-01-02 DIAGNOSIS — G4733 Obstructive sleep apnea (adult) (pediatric): Secondary | ICD-10-CM | POA: Diagnosis not present

## 2021-01-02 DIAGNOSIS — H353211 Exudative age-related macular degeneration, right eye, with active choroidal neovascularization: Secondary | ICD-10-CM | POA: Diagnosis not present

## 2021-01-07 DIAGNOSIS — E785 Hyperlipidemia, unspecified: Secondary | ICD-10-CM | POA: Diagnosis not present

## 2021-01-07 DIAGNOSIS — R7301 Impaired fasting glucose: Secondary | ICD-10-CM | POA: Diagnosis not present

## 2021-01-07 DIAGNOSIS — I10 Essential (primary) hypertension: Secondary | ICD-10-CM | POA: Diagnosis not present

## 2021-01-07 DIAGNOSIS — Z125 Encounter for screening for malignant neoplasm of prostate: Secondary | ICD-10-CM | POA: Diagnosis not present

## 2021-01-07 DIAGNOSIS — E291 Testicular hypofunction: Secondary | ICD-10-CM | POA: Diagnosis not present

## 2021-01-14 DIAGNOSIS — E669 Obesity, unspecified: Secondary | ICD-10-CM | POA: Diagnosis not present

## 2021-01-14 DIAGNOSIS — K5190 Ulcerative colitis, unspecified, without complications: Secondary | ICD-10-CM | POA: Diagnosis not present

## 2021-01-14 DIAGNOSIS — D692 Other nonthrombocytopenic purpura: Secondary | ICD-10-CM | POA: Diagnosis not present

## 2021-01-14 DIAGNOSIS — R7301 Impaired fasting glucose: Secondary | ICD-10-CM | POA: Diagnosis not present

## 2021-01-14 DIAGNOSIS — R82998 Other abnormal findings in urine: Secondary | ICD-10-CM | POA: Diagnosis not present

## 2021-01-14 DIAGNOSIS — Z1331 Encounter for screening for depression: Secondary | ICD-10-CM | POA: Diagnosis not present

## 2021-01-14 DIAGNOSIS — Z23 Encounter for immunization: Secondary | ICD-10-CM | POA: Diagnosis not present

## 2021-01-14 DIAGNOSIS — Z0000 Encounter for general adult medical examination without abnormal findings: Secondary | ICD-10-CM | POA: Diagnosis not present

## 2021-01-14 DIAGNOSIS — Z1389 Encounter for screening for other disorder: Secondary | ICD-10-CM | POA: Diagnosis not present

## 2021-01-14 DIAGNOSIS — I2510 Atherosclerotic heart disease of native coronary artery without angina pectoris: Secondary | ICD-10-CM | POA: Diagnosis not present

## 2021-01-14 DIAGNOSIS — E785 Hyperlipidemia, unspecified: Secondary | ICD-10-CM | POA: Diagnosis not present

## 2021-01-14 DIAGNOSIS — I10 Essential (primary) hypertension: Secondary | ICD-10-CM | POA: Diagnosis not present

## 2021-01-14 DIAGNOSIS — I700 Atherosclerosis of aorta: Secondary | ICD-10-CM | POA: Diagnosis not present

## 2021-01-14 DIAGNOSIS — J449 Chronic obstructive pulmonary disease, unspecified: Secondary | ICD-10-CM | POA: Diagnosis not present

## 2021-01-15 ENCOUNTER — Other Ambulatory Visit: Payer: Self-pay | Admitting: Internal Medicine

## 2021-01-15 DIAGNOSIS — I714 Abdominal aortic aneurysm, without rupture, unspecified: Secondary | ICD-10-CM

## 2021-01-19 ENCOUNTER — Other Ambulatory Visit: Payer: Self-pay | Admitting: Internal Medicine

## 2021-01-21 ENCOUNTER — Ambulatory Visit
Admission: RE | Admit: 2021-01-21 | Discharge: 2021-01-21 | Disposition: A | Discharge status: Home / Self Care | Payer: Medicare HMO | Source: Ambulatory Visit | Attending: Internal Medicine | Admitting: Internal Medicine

## 2021-01-21 DIAGNOSIS — I700 Atherosclerosis of aorta: Secondary | ICD-10-CM | POA: Diagnosis not present

## 2021-01-21 DIAGNOSIS — I714 Abdominal aortic aneurysm, without rupture, unspecified: Secondary | ICD-10-CM

## 2021-01-21 DIAGNOSIS — Z8679 Personal history of other diseases of the circulatory system: Secondary | ICD-10-CM | POA: Diagnosis not present

## 2021-01-23 DIAGNOSIS — E785 Hyperlipidemia, unspecified: Secondary | ICD-10-CM | POA: Diagnosis not present

## 2021-01-23 DIAGNOSIS — I10 Essential (primary) hypertension: Secondary | ICD-10-CM | POA: Diagnosis not present

## 2021-01-23 DIAGNOSIS — I2510 Atherosclerotic heart disease of native coronary artery without angina pectoris: Secondary | ICD-10-CM | POA: Diagnosis not present

## 2021-01-23 DIAGNOSIS — J449 Chronic obstructive pulmonary disease, unspecified: Secondary | ICD-10-CM | POA: Diagnosis not present

## 2021-02-06 DIAGNOSIS — H353211 Exudative age-related macular degeneration, right eye, with active choroidal neovascularization: Secondary | ICD-10-CM | POA: Diagnosis not present

## 2021-02-06 DIAGNOSIS — H43823 Vitreomacular adhesion, bilateral: Secondary | ICD-10-CM | POA: Diagnosis not present

## 2021-02-06 DIAGNOSIS — H353121 Nonexudative age-related macular degeneration, left eye, early dry stage: Secondary | ICD-10-CM | POA: Diagnosis not present

## 2021-02-06 DIAGNOSIS — H2513 Age-related nuclear cataract, bilateral: Secondary | ICD-10-CM | POA: Diagnosis not present

## 2021-02-17 ENCOUNTER — Encounter: Payer: Self-pay | Admitting: Gastroenterology

## 2021-02-17 ENCOUNTER — Ambulatory Visit: Payer: Medicare HMO | Admitting: Gastroenterology

## 2021-02-17 VITALS — BP 120/60 | HR 88 | Ht 70.25 in | Wt 247.4 lb

## 2021-02-17 DIAGNOSIS — K51919 Ulcerative colitis, unspecified with unspecified complications: Secondary | ICD-10-CM | POA: Diagnosis not present

## 2021-02-17 MED ORDER — FOLIC ACID 1 MG PO TABS: 1.0000 mg | ORAL_TABLET | Freq: Every morning | ORAL | 3 refills | Status: DC

## 2021-02-17 MED ORDER — SULFASALAZINE 500 MG PO TABS: ORAL_TABLET | ORAL | 3 refills | Status: DC

## 2021-02-17 MED ORDER — MERCAPTOPURINE 50 MG PO TABS
100.0000 mg | ORAL_TABLET | Freq: Every day | ORAL | 1 refills | Status: DC
Start: 2021-02-17 — End: 2021-09-03

## 2021-02-17 NOTE — Patient Instructions (Addendum)
We have sent the following medications to your pharmacy for you to pick up at your convenience: Sulfasalazine, Purinethol, Folic Acid.  If you are age 74 or older, your body mass index should be between 23-30. Your Body mass index is 35.24 kg/m. If this is out of the aforementioned range listed, please consider follow up with your Primary Care Provider.  If you are age 76 or younger, your body mass index should be between 19-25. Your Body mass index is 35.24 kg/m. If this is out of the aformentioned range listed, please consider follow up with your Primary Care Provider.   ________________________________________________________  The [place] [facility] [facility] providers would like to encourage you to use [facility] to communicate with providers for non-urgent requests or questions.  Due to long hold times on the telephone, sending your provider a message by [facility] may be a faster and more efficient way to get a response.  Please allow 48 business hours for a response.  Please remember that this is for non-urgent requests.  _______________________________________________________

## 2021-02-17 NOTE — Progress Notes (Signed)
02/17/2021 Ricardo Cooper [id number] Apr 01, 1946   HISTORY OF PRESENT ILLNESS: This is a 75 year old male who is a patient of Dr. Vena Rua and follows here for history of left-sided ulcerative colitis.  He comes in today for routine follow-up and need for refills on his medications.  He also has history of hypertension, atrial fibrillation, COPD, sleep apnea, hyperlipidemia, adenomatous colon polyps, diverticulosis.  He was last seen here in January 2021.  His last colonoscopy was in January 2020 at which time he was found to have 5 polyps, the largest 10 mm.  Pathology showed 2 of the polyps were tubular adenomas and 3 were hyperplastic.  He had normal appearing colonic mucosa throughout and multiple diverticuli in the descending and sigmoid colon.  He has been maintained on mercaptopurine 100 mg daily, sulfasalazine 2000 mg 3 times daily, and folic acid 1 mg daily.  He has been doing really well.  Says that he has been in remission.  He has anywhere from a bowel movement every other day to a bowel movement daily.  Rarely has 2 bowel movements a day.  No rectal bleeding.  CBC and CMP at his PCP's office on 01/14/2021 are within normal limits.   Past Medical History:  Diagnosis Date   Allergic rhinitis    Allergy    Cataract    beginnings of cataracts    CHF (congestive heart failure) ([facility])    COPD (chronic obstructive pulmonary disease) ([facility])    Diverticulosis    GERD (gastroesophageal reflux disease)    no issues with CPAP-1999   HTN (hypertension)    Hyperlipidemia    Obesity    Obstructive sleep apnea on CPAP    Paroxysmal atrial fibrillation ([place]) 2005   past trauma and infection   Skin cancer    basel cell on nose,arm,leg   Sleep apnea    with CPAP   Ulcerative colitis    Ulcerative colitis, left sided [facility])    Past Surgical History:  Procedure Laterality Date   BACK SURGERY  2005   BELPHAROPTOSIS REPAIR     bilateral   COLONOSCOPY  2013   Dr. Hilarie Fredrickson    FETAL  SURGERY FOR CONGENITAL HERNIA  2001   infection in chest that had to be cleaned out     Staph Infection   POLYPECTOMY     SKIN CANCER EXCISION     TONSILLECTOMY     tubular adenoma  8811   [place]      reports that he quit smoking about 19 years ago. His smoking use included cigarettes. He has a 35.00 pack-year smoking history. He quit smokeless tobacco use about 29 years ago.  His smokeless tobacco use included chew. He reports current alcohol use of about 3.0 - 4.0 standard drinks per week. He reports that he does not use drugs. family history includes Allergies in his mother; Asthma in his sister; Emphysema in his father and maternal grandfather; Heart disease in his father and maternal grandfather; Stroke in his maternal grandfather. Allergies  Allergen Reactions   Ibuprofen Other (See Comments)    REACTION: causes colitis flare Pt was told not to take Ibuprofen d/t reaction      Outpatient Encounter Medications as of 02/17/2021  Medication Sig   acetaminophen (TYLENOL) 500 MG tablet Take 500 mg by mouth every 6 (six) hours as needed for mild pain.   albuterol (VENTOLIN HFA) 108 (90 Base) MCG/ACT inhaler Inhale 2 puffs  into the lungs every 6 (six) hours as needed.   diltiazem (CARDIZEM CD) 180 MG 24 hr capsule Take 1 capsule (180 mg total) by mouth daily. Please call for an office visit [phone]   ezetimibe (ZETIA) 10 MG tablet Take 10 mg by mouth daily.   fish oil-omega-3 fatty acids 1000 MG capsule Take 2 g by mouth daily.   folic acid (FOLVITE) 1 MG tablet Take 1 tablet (1 mg total) by mouth every morning. **PLEASE CONTACT OFFICE TO SCHEDULE APPOINTMENT   hydrochlorothiazide (HYDRODIURIL) 12.5 MG tablet Take 12.5 mg by mouth every morning.   losartan (COZAAR) 50 MG tablet Take 50 mg by mouth daily.   Melatonin 5 MG TABS Take 10 mg by mouth at bedtime.    meloxicam (MOBIC) 7.5 MG tablet Take 1 tablet by mouth as needed.   mercaptopurine  (PURINETHOL) 50 MG tablet Take 2 tablets (100 mg total) by mouth daily. Give on an empty stomach 1 hour before or 2 hours after meals. Caution: Chemotherapy.   Multiple Vitamins-Minerals (PRESERVISION AREDS 2) CAPS Take 1 tablet by mouth 2 (two) times daily.   NIACINAMIDE PO Take 1 capsule by mouth 2 (two) times daily.   pravastatin (PRAVACHOL) 20 MG tablet Take 20 mg by mouth at bedtime.   sulfaSALAzine (AZULFIDINE) 500 MG tablet TAKE 2 TABLETS IN MORNING, 2 TABS AT LUNCH, AND 2 TABLETS AT DINNER.   tadalafil (CIALIS) 5 MG tablet Take 5 mg by mouth at bedtime.   tobramycin (TOBREX) 0.3 % ophthalmic solution Place 1 drop into both eyes in the morning, at noon, in the evening, and at bedtime. Once a month   TRELEGY ELLIPTA 100-62.5-25 MCG/INH AEPB INHALE 1 PUFF BY MOUTH EVERY DAY   vitamin B-12 (CYANOCOBALAMIN) 250 MCG tablet Take 1,000 mcg by mouth daily.   vitamin C (ASCORBIC ACID) 250 MG tablet Take 500 mg by mouth daily.   Vitamin D, Cholecalciferol, 25 MCG (1000 UT) CAPS Take 1 capsule by mouth daily.   [DISCONTINUED] losartan-hydrochlorothiazide (HYZAAR) 50-12.5 MG tablet Take 1 tablet by mouth daily.   [DISCONTINUED] Coenzyme Q10 100 MG TABS Take 100 mg by mouth daily.    [DISCONTINUED] CORTIFOAM 90 MG rectal foam    [DISCONTINUED] diltiazem (DILACOR XR) 180 MG 24 hr capsule Take 1 capsule (180 mg total) by mouth daily.   No facility-administered encounter medications on file as of 02/17/2021.    REVIEW OF SYSTEMS  : All other systems reviewed and negative except where noted in the History of Present Illness.   PHYSICAL EXAM: BP 120/60 (BP Location: Left Arm, Patient Position: Sitting, Cuff Size: Normal)   Pulse 88   Ht 5' 10.25" (1.784 m) Comment: height measured without shoes  Wt 247 lb 6 oz (112.2 kg)   BMI 35.24 kg/m  General: Well developed white male in no acute distress Head: Normocephalic and atraumatic Eyes:  Sclerae anicteric, conjunctiva pink. Ears: Normal auditory  acuity Lungs: Clear throughout to auscultation; no W/R/R. Heart: Regular rate and rhythm; no M/R/G. Abdomen: Soft, non-distended.  BS present.  Non-tender.  Ventral hernia/diastasis recti noted. Musculoskeletal: Symmetrical with no gross deformities  Skin: No lesions on visible extremities Extremities: No edema  Neurological: Alert oriented x 4, grossly non-focal Psychological:  Alert and cooperative. Normal mood and affect  ASSESSMENT AND PLAN: *75 year old male with left-sided ulcerative colitis in remission on current regimen *History of adenomatous colon polyps with last colonoscopy January 2022, due for follow-up January 2023  -He will continue his sulfasalazine 1 g  3 times daily, mercaptopurine 100 mg daily, and folic acid 1 mg daily.  Prescriptions sent to his pharmacy with enough refills for 1 year. -He just had labs performed by his PCP and we were able to access those so he does not need new labs drawn today. -He will plan for colonoscopy when he gets his colonoscopy recall letter early next year   CC:  Ginger Organ., MD

## 2021-02-18 NOTE — Progress Notes (Signed)
Addendum: Reviewed and agree with assessment and management plan. Alyvia Derk M, MD  

## 2021-03-05 DIAGNOSIS — I2510 Atherosclerotic heart disease of native coronary artery without angina pectoris: Secondary | ICD-10-CM | POA: Diagnosis not present

## 2021-03-05 DIAGNOSIS — R051 Acute cough: Secondary | ICD-10-CM | POA: Diagnosis not present

## 2021-03-05 DIAGNOSIS — J441 Chronic obstructive pulmonary disease with (acute) exacerbation: Secondary | ICD-10-CM | POA: Diagnosis not present

## 2021-03-05 DIAGNOSIS — R69 Illness, unspecified: Secondary | ICD-10-CM | POA: Diagnosis not present

## 2021-03-06 DIAGNOSIS — R5383 Other fatigue: Secondary | ICD-10-CM | POA: Diagnosis not present

## 2021-03-06 DIAGNOSIS — R051 Acute cough: Secondary | ICD-10-CM | POA: Diagnosis not present

## 2021-03-06 DIAGNOSIS — R509 Fever, unspecified: Secondary | ICD-10-CM | POA: Diagnosis not present

## 2021-03-17 ENCOUNTER — Telehealth: Payer: Self-pay | Admitting: Emergency Medicine

## 2021-03-17 ENCOUNTER — Encounter: Payer: Self-pay | Admitting: Primary Care

## 2021-03-17 ENCOUNTER — Ambulatory Visit: Payer: Medicare HMO | Admitting: Primary Care

## 2021-03-17 ENCOUNTER — Other Ambulatory Visit: Payer: Self-pay | Admitting: Primary Care

## 2021-03-17 ENCOUNTER — Other Ambulatory Visit: Payer: Self-pay

## 2021-03-17 ENCOUNTER — Ambulatory Visit (INDEPENDENT_AMBULATORY_CARE_PROVIDER_SITE_OTHER): Payer: Medicare HMO

## 2021-03-17 ENCOUNTER — Ambulatory Visit: Payer: Medicare HMO

## 2021-03-17 VITALS — BP 120/60 | HR 94 | Temp 98.5°F | Ht 70.0 in | Wt 244.0 lb

## 2021-03-17 DIAGNOSIS — R0602 Shortness of breath: Secondary | ICD-10-CM | POA: Diagnosis not present

## 2021-03-17 DIAGNOSIS — J90 Pleural effusion, not elsewhere classified: Secondary | ICD-10-CM | POA: Diagnosis not present

## 2021-03-17 DIAGNOSIS — J189 Pneumonia, unspecified organism: Secondary | ICD-10-CM

## 2021-03-17 DIAGNOSIS — J449 Chronic obstructive pulmonary disease, unspecified: Secondary | ICD-10-CM

## 2021-03-17 DIAGNOSIS — S2231XA Fracture of one rib, right side, initial encounter for closed fracture: Secondary | ICD-10-CM | POA: Diagnosis not present

## 2021-03-17 MED ORDER — DM-GUAIFENESIN ER 30-600 MG PO TB12: 1.0000 | ORAL_TABLET | Freq: Two times a day (BID) | ORAL | 0 refills | Status: DC

## 2021-03-17 MED ORDER — PROMETHAZINE-CODEINE 6.25-10 MG/5ML PO SYRP: 5.0000 mL | ORAL_SOLUTION | Freq: Every evening | ORAL | 0 refills | Status: DC | PRN

## 2021-03-17 MED ORDER — LEVOFLOXACIN 500 MG PO TABS: 500.0000 mg | ORAL_TABLET | Freq: Every day | ORAL | 0 refills | Status: DC

## 2021-03-17 MED ORDER — ALBUTEROL SULFATE (2.5 MG/3ML) 0.083% IN NEBU: 2.5000 mg | INHALATION_SOLUTION | Freq: Four times a day (QID) | RESPIRATORY_TRACT | 1 refills | Status: DC | PRN

## 2021-03-17 NOTE — Telephone Encounter (Signed)
Called and spoke with pt and his wife gayle  Pt was seen on 11/10 by PCP and given zpak and 6 days of prednison.  He stated that his covid and flu tests were negative and they did do a cxr that day as well.   He was then given another 5 days of prednsone as he was still coughing.    Today he woke up with fever of 100.2, body aches, tired, fatigue, cough with clear sputum. He has been coughing for about 3 weeks.  He stated that he stopped the mucinex as this was making him cough more.  RB please advise. Thanks   Allergies  Allergen Reactions   Ibuprofen Other (See Comments)    REACTION: causes colitis flare Pt was told not to take Ibuprofen d/t reaction

## 2021-03-17 NOTE — Progress Notes (Signed)
_0  ID: Ricardo Cooper, male    DOB: Sep 11, 1945, 75 y.o.   MRN: [id number]  Chief Complaint  Patient presents with   Follow-up    Referring provider: Ginger Organ., MD  HPI: 75 year old male, former smoker quit in 2003. PMH significant for COPD GOLD II, OSA, allergic rhinitis, HTN, afib, UC, hyperlipidemia. Patient of Dr. Lamonte Sakai, last seen on 05/10/19. He also follows with Dr. Annamaria Boots for sleep apnea, last seen on   03/17/2021- Interim hx  Patient presents today for acute visit/CXR prior. Patient was seen on 11/10 by PCP and given zpack and two prednisone tapers for total 11 days. He woke up today with fever of 100.2 along with body aches and fatigue. He has had a cough with clear to light yellow sputum x 3 weeks. He stopped taking mucinex as it was making him cough more. He finished the second course of prednisone yesterday. He is complaint Trelegy 151mg one puff daily and albuterol 3-4 times a day.    Allergies  Allergen Reactions   Ibuprofen Other (See Comments)    REACTION: causes colitis flare Pt was told not to take Ibuprofen d/t reaction    Immunization History  Administered Date(s) Administered   Influenza Split 01/14/2011, 01/14/2012, 01/24/2013, 01/24/2014, 01/24/2017   Influenza Whole 12/25/2009   Influenza,inj,Quad PF,6+ Mos 12/28/2014   Influenza,inj,Quad PF,6-35 Mos 12/26/2018   Influenza-Unspecified 12/30/2015, 01/14/2018   PFIZER(Purple Top)SARS-COV-2 Vaccination 05/24/2019   Pneumococcal Conjugate-13 10/24/2013   Pneumococcal Polysaccharide-23 02/01/2008, 11/24/2012   Zoster Recombinat (Shingrix) 02/22/2018, 05/02/2018    Past Medical History:  Diagnosis Date   Allergic rhinitis    Allergy    Cataract    beginnings of cataracts    CHF (congestive heart failure) (HCC)    COPD (chronic obstructive pulmonary disease) (HCC)    Diverticulosis    GERD (gastroesophageal reflux disease)    no issues with CPAP-1999   HTN (hypertension)     Hyperlipidemia    Obesity    Obstructive sleep apnea on CPAP    [place]oxysmal atrial fibrillation ([place] 2005   past trauma and infection   Skin cancer    basel cell on nose,arm,leg   Sleep apnea    with CPAP   Ulcerative colitis    Ulcerative colitis, left sided (HCC)     Tobacco History: Social History   Tobacco Use  Smoking Status Former   Packs/day: 1.00   Years: 35.00   Pack years: 35.00   Types: Cigarettes   Quit date: 04/26/2001   Years since quitting: 19.9  Smokeless Tobacco Former   Types: Chew   Quit date: 06/25/1991  Tobacco Comments   quit in 2005   Counseling given: Not Answered Tobacco comments: quit in 2005   Outpatient Medications Prior to Visit  Medication Sig Dispense Refill   acetaminophen (TYLENOL) 500 MG tablet Take 500 mg by mouth every 6 (six) hours as needed for mild pain.     albuterol (VENTOLIN HFA) 108 (90 Base) MCG/ACT inhaler Inhale 2 puffs into the lungs every 6 (six) hours as needed.     diltiazem (CARDIZEM CD) 180 MG 24 hr capsule Take 1 capsule (180 mg total) by mouth daily. Please call for an office visit [phone] capsule 3   ezetimibe (ZETIA) 10 MG tablet Take 10 mg by mouth daily.     fish oil-omega-3 fatty acids 1000 MG capsule Take 2 g by mouth daily.     folic acid (FOLVITE) 1 MG tablet  Take 1 tablet (1 mg total) by mouth every morning. 90 tablet 3   hydrochlorothiazide (HYDRODIURIL) 12.5 MG tablet Take 12.5 mg by mouth every morning.     losartan (COZAAR) 50 MG tablet Take 50 mg by mouth daily.     Melatonin 5 MG TABS Take 10 mg by mouth at bedtime.      meloxicam (MOBIC) 7.5 MG tablet Take 1 tablet by mouth as needed.     mercaptopurine (PURINETHOL) 50 MG tablet Take 2 tablets (100 mg total) by mouth daily. Give on an empty stomach 1 hour before or 2 hours after meals. Caution: Chemotherapy. 180 tablet 1   Multiple Vitamins-Minerals (PRESERVISION AREDS 2) CAPS Take 1 tablet by mouth 2 (two) times daily.     NIACINAMIDE PO Take 1  capsule by mouth 2 (two) times daily.     pravastatin (PRAVACHOL) 20 MG tablet Take 20 mg by mouth at bedtime.  4   sulfaSALAzine (AZULFIDINE) 500 MG tablet TAKE 2 TABLETS IN MORNING, 2 TABS AT LUNCH, AND 2 TABLETS AT DINNER. 540 tablet 3   tadalafil (CIALIS) 5 MG tablet Take 5 mg by mouth at bedtime.     tobramycin (TOBREX) 0.3 % ophthalmic solution Place 1 drop into both eyes in the morning, at noon, in the evening, and at bedtime. Once a month     TRELEGY ELLIPTA 100-62.5-25 MCG/INH AEPB INHALE 1 PUFF BY MOUTH EVERY DAY 60 each 12   vitamin B-12 (CYANOCOBALAMIN) 250 MCG tablet Take 1,000 mcg by mouth daily.     vitamin C (ASCORBIC ACID) 250 MG tablet Take 500 mg by mouth daily.     Vitamin D, Cholecalciferol, 25 MCG (1000 UT) CAPS Take 1 capsule by mouth daily.     No facility-administered medications prior to visit.   Review of Systems  Review of Systems  Constitutional:  Positive for fever.  HENT: Negative.    Respiratory:  Positive for cough. Negative for chest tightness, shortness of breath and wheezing.     Physical Exam  BP 120/60 (BP Location: Left Arm, Patient Position: Sitting, Cuff Size: Normal)   Pulse 94   Temp 98.5 F (36.9 C) (Temporal)   Ht _0  (1.778 m)   Wt 244 lb (110.7 kg)   SpO2 94%   BMI 35.01 kg/m  Physical Exam Constitutional:      Appearance: Normal appearance.  HENT:     Head: Normocephalic and atraumatic.     Mouth/Throat:     Comments: Deferred d/t masking Cardiovascular:     Rate and Rhythm: Normal rate and regular rhythm.  Pulmonary:     Effort: Pulmonary effort is normal.     Breath sounds: Normal breath sounds.     Comments: Reactive cough. No overt rhonchi, slightly diminished left base  Musculoskeletal:        General: Normal range of motion.  Skin:    General: Skin is warm and dry.  Neurological:     General: No focal deficit present.     Mental Status: He is alert and oriented to person, place, and time. Mental status is at  baseline.  Psychiatric:        Mood and Affect: Mood normal.        Behavior: Behavior normal.        Thought Content: Thought content normal.        Judgment: Judgment normal.     Lab Results:  CBC    Component Value Date/Time   WBC 11.9 (  H) 03/17/2021 1631   RBC 4.19 (L) 03/17/2021 1631   HGB 14.1 03/17/2021 1631   HCT 43.8 03/17/2021 1631   PLT 192.0 03/17/2021 1631   MCV 104.3 (H) 03/17/2021 1631   MCH 32.4 11/01/2009 0500   MCHC 32.3 03/17/2021 1631   RDW 14.7 03/17/2021 1631   LYMPHSABS 0.7 03/17/2021 1631   MONOABS 1.6 (H) 03/17/2021 1631   EOSABS 0.1 03/17/2021 1631   BASOSABS 0.1 03/17/2021 1631    BMET    Component Value Date/Time   NA 137 03/17/2021 1631   K 3.5 03/17/2021 1631   CL 99 03/17/2021 1631   CO2 30 03/17/2021 1631   GLUCOSE 117 (H) 03/17/2021 1631   BUN 19 03/17/2021 1631   CREATININE 1.00 03/17/2021 1631   CALCIUM 8.9 03/17/2021 1631   GFRNONAA 70.38 03/23/2010 0847   GFRAA  11/01/2009 0500    >60        The eGFR has been calculated using the MDRD equation. This calculation has not been validated in all clinical situations. eGFR's persistently <60 mL/min signify possible Chronic Kidney Disease.    BNP No results found for: BNP  ProBNP No results found for: PROBNP  Imaging: DG Chest 2 View  Result Date: 03/17/2021 CLINICAL DATA:  Short of breath EXAM: CHEST - 2 VIEW COMPARISON:  04/23/2016 FINDINGS: Frontal and lateral views of the chest demonstrate a stable cardiac silhouette. Left lower lobe airspace disease identified, obscuring the left costophrenic angle. Small left effusion is suspected. No pneumothorax. Right chest is clear. Prior healed right rib fractures. No acute bony abnormalities. Postsurgical changes at the thoracolumbar junction. IMPRESSION: 1. Left lower lobe pneumonia, with small left parapneumonic effusion. Electronically Signed   By: Randa Ngo M.D.   On: 03/17/2021 15:21     Assessment & Plan:   CAP  (community acquired pneumonia) - Patient developed cough 3 week ago, originally treated by PCP on 03/05/21 with zpack and two rounds of prednisone. He developed fever yesterday 100.2 with associated body aches and fatigue. CXR  today showed left lower lobe pneumonia with small left parapneumonic effusion. Starting patient on Levaquin 560m daily x 7 days and advised he resume mucinex 6010mtwice daily. Sent in Hycodan cough syrup 43m53m 6 hours to take as needed for cough. Follow-up in 4-6 weeks with Dr. ByrLamonte Sakai BetEustaquio Maize- CXR prior / or sooner if not improving   COPD GOLD II - Continue Trelegy Ellipta 100m51mne puff daily  - Use Albuterol nebulizer every 8 hours for shortness of breath/cough  40 mins spent on case; > 50% face to face with patient   ElizMartyn Ehrich 03/18/2021

## 2021-03-17 NOTE — Telephone Encounter (Signed)
STAT CXR order placed

## 2021-03-17 NOTE — Telephone Encounter (Signed)
Spoke with pt and scheduled with Geraldo Pitter at 3:30 today for OV and CXR. Pt instructed to arrive at 3:15 to completed CXR. CXR order placed. Pt stated understanding. Nothing further needed at this time.   Routing Geraldo Pitter as [place]

## 2021-03-17 NOTE — Telephone Encounter (Signed)
Very well may have had flu - too late to test for that now.  He was treated for flaring sx with abx and pred. Not clear that this needs to be extended, would hold off for now.  He may need a repeat CXR since it has been 12 days >> would like for him to be seen w a CXR In meantime, Symptomatic relief >> OTC meds like tylenol cold and flu

## 2021-03-17 NOTE — Patient Instructions (Addendum)
CXR today showed left lower lobe pneumonia, with small left parapneumonic effusion  Recommendations: Start Levaquin today- take 1 tablet daily x 7 days  Resume Mucinex- DM 655m twice daily  Continue Trelegy Ellipta one puff daily  Use Albuterol nebulizer every 8 hours for shortness of breath/cough  Follow-up: 4-6 weeks with Dr. BLamonte Sakaior BEustaquio MaizeNP- CXR prior / or sooner if not improving     Community-Acquired Pneumonia, Adult Pneumonia is an infection of the lungs. It causes irritation and swelling in the airways of the lungs. Mucus and fluid may also build up inside the airways. This may cause coughing and trouble breathing. One type of pneumonia can happen while you are in a hospital. A different type can happen when you are not in a hospital (community-acquired pneumonia). What are the causes? This condition is caused by germs (viruses, bacteria, or fungi). Some types of germs can spread from person to person. Pneumonia is not thought to spread from person to person. What increases the risk? You are more likely to develop this condition if: You have a long-term (chronic) disease, such as: Disease of the lungs. This may be chronic obstructive pulmonary disease (COPD) or asthma. Heart failure. Cystic fibrosis. Diabetes. Kidney disease. Sickle cell disease. HIV. You have other health problems, such as: Your body's defense system (immune system) is weak. A condition that may cause you to breathe in fluids from your mouth and nose. You had your spleen taken out. You do not take good care of your teeth and mouth (poor dental hygiene). You use or have used tobacco products. You travel where the germs that cause this illness are common. You are near certain animals or the places they live. You are older than 74years of age. What are the signs or symptoms? Symptoms of this condition include: A cough. A fever. Sweating or chills. Chest pain, often when you breathe deeply or  cough. Breathing problems, such as: Fast breathing. Trouble breathing. Shortness of breath. Feeling tired (fatigued). Muscle aches. How is this treated? Treatment for this condition depends on many things, such as: The cause of your illness. Your medicines. Your other health problems. Most adults can be treated at home. Sometimes, treatment must happen in a hospital. Treatment may include medicines to kill germs. Medicines may depend on which germ caused your illness. Very bad pneumonia is rare. If you get it, you may: Have a machine to help you breathe. Have fluid taken away from around your lungs. Follow these instructions at home: Medicines Take over-the-counter and prescription medicines only as told by your doctor. Take cough medicine only if you are losing sleep. Cough medicine can keep your body from taking mucus away from your lungs. If you were prescribed an antibiotic medicine, take it as told by your doctor. Do not stop taking the antibiotic even if you start to feel better. Lifestyle   Do not drink alcohol. Do not use any products that contain nicotine or tobacco, such as cigarettes, e-cigarettes, and chewing tobacco. If you need help quitting, ask your doctor. Eat a healthy diet. This includes a lot of vegetables, fruits, whole grains, low-fat dairy products, and low-fat (lean) protein. General instructions  Rest a lot. Sleep for at least 8 hours each night. Sleep with your head and neck raised. Put a few pillows under your head or sleep in a reclining chair. Return to your normal activities as told by your doctor. Ask your doctor what activities are safe for you. Drink enough fluid  to keep your pee (urine) pale yellow. If your throat is sore, rinse your mouth often with salt water. To make salt water, dissolve -1 tsp (3-6 g) of salt in 1 cup (237 mL) of warm water. Keep all follow-up visits as told by your doctor. This is important. How is this prevented? You can  lower your risk of pneumonia by: Getting the pneumonia shot (vaccine). These shots have different types and schedules. Ask your doctor what works best for you. Think about getting this shot if: You are older than 74 years of age. You are 3-55 years of age and: You are being treated for cancer. You have long-term lung disease. You have other problems that affect your body's defense system. Ask your doctor if you have one of these. Getting your flu shot every year. Ask your doctor which type of shot is best for you. Going to the dentist as often as told. Washing your hands often with soap and water for at least 20 seconds. If you cannot use soap and water, use hand sanitizer. Contact a doctor if: You have a fever. You lose sleep because your cough medicine does not help. Get help right away if: You are short of breath and this gets worse. You have more chest pain. Your sickness gets worse. This is very serious if: You are an older adult. Your body's defense system is weak. You cough up blood. These symptoms may be an emergency. Do not wait to see if the symptoms will go away. Get medical help right away. Call your local emergency services (911 in the [place] Do not drive yourself to the hospital. Summary Pneumonia is an infection of the lungs. Community-acquired pneumonia affects people who have not been in the hospital. Certain germs can cause this infection. This condition may be treated with medicines that kill germs. For very bad pneumonia, you may need a hospital stay and treatment to help with breathing. This information is not intended to replace advice given to you by your health care provider. Make sure you discuss any questions you have with your health care provider. Document Revised: 01/23/2019 Document Reviewed: 01/23/2019 Elsevier Patient Education  [place].

## 2021-03-17 NOTE — Addendum Note (Signed)
Addended by: Gavin Potters R on: 03/17/2021 10:11 AM   Modules accepted: Orders

## 2021-03-17 NOTE — Telephone Encounter (Signed)
Make sure CXR is stat

## 2021-03-18 ENCOUNTER — Telehealth: Payer: Self-pay | Admitting: Adult Health

## 2021-03-18 DIAGNOSIS — J189 Pneumonia, unspecified organism: Secondary | ICD-10-CM | POA: Insufficient documentation

## 2021-03-18 LAB — CBC WITH DIFFERENTIAL/PLATELET
Basophils Absolute: 0.1 10*3/uL (ref 0.0–0.1)
Basophils Relative: 0.8 % (ref 0.0–3.0)
Eosinophils Absolute: 0.1 10*3/uL (ref 0.0–0.7)
Eosinophils Relative: 0.8 % (ref 0.0–5.0)
HCT: 43.8 % (ref 39.0–52.0)
Hemoglobin: 14.1 g/dL (ref 13.0–17.0)
Lymphocytes Relative: 5.5 % — ABNORMAL LOW (ref 12.0–46.0)
Lymphs Abs: 0.7 10*3/uL (ref 0.7–4.0)
MCHC: 32.3 g/dL (ref 30.0–36.0)
MCV: 104.3 fl — ABNORMAL HIGH (ref 78.0–100.0)
Monocytes Absolute: 1.6 10*3/uL — ABNORMAL HIGH (ref 0.1–1.0)
Monocytes Relative: 13.2 % — ABNORMAL HIGH (ref 3.0–12.0)
Neutro Abs: 9.5 10*3/uL — ABNORMAL HIGH (ref 1.4–7.7)
Neutrophils Relative %: 79.7 % — ABNORMAL HIGH (ref 43.0–77.0)
Platelets: 192 10*3/uL (ref 150.0–400.0)
RBC: 4.19 Mil/uL — ABNORMAL LOW (ref 4.22–5.81)
RDW: 14.7 % (ref 11.5–15.5)
WBC: 11.9 10*3/uL — ABNORMAL HIGH (ref 4.0–10.5)

## 2021-03-18 LAB — BASIC METABOLIC PANEL
BUN: 19 mg/dL (ref 6–23)
CO2: 30 mEq/L (ref 19–32)
Calcium: 8.9 mg/dL (ref 8.4–10.5)
Chloride: 99 mEq/L (ref 96–112)
Creatinine, Ser: 1 mg/dL (ref 0.40–1.50)
GFR: 74.42 mL/min (ref >60.00)
Glucose, Bld: 117 mg/dL — ABNORMAL HIGH (ref 70–99)
Potassium: 3.5 mEq/L (ref 3.5–5.1)
Sodium: 137 mEq/L (ref 135–145)

## 2021-03-18 MED ORDER — HYDROCODONE BIT-HOMATROP MBR 5-1.5 MG/5ML PO SOLN: 5.0000 mL | Freq: Four times a day (QID) | ORAL | 0 refills | Status: DC | PRN

## 2021-03-18 MED ORDER — BENZONATATE 200 MG PO CAPS: 200.0000 mg | ORAL_CAPSULE | Freq: Three times a day (TID) | ORAL | 1 refills | Status: DC | PRN

## 2021-03-18 NOTE — Telephone Encounter (Signed)
Sent in hycodan. Advised to switch to regular mucinex as needed to loosen congestion

## 2021-03-18 NOTE — Assessment & Plan Note (Signed)
-   Patient developed cough 3 week ago, originally treated by PCP on 03/05/21 with zpack and two rounds of prednisone. He developed fever yesterday 100.2 with associated body aches and fatigue. CXR  today showed left lower lobe pneumonia with small left parapneumonic effusion. Starting patient on Levaquin 529m daily x 7 days and advised he resume mucinex 6087mtwice daily. Sent in Hycodan cough syrup 47m67m 6 hours to take as needed for cough. Follow-up in 4-6 weeks with Dr. ByrLamonte Sakai BetEustaquio Maize- CXR prior / or sooner if not improving

## 2021-03-18 NOTE — Telephone Encounter (Signed)
Call from patient that codeine cough syrup not available at pharmacy -pharmacy said does not have codeine syrups.  Request a new rx

## 2021-03-18 NOTE — Assessment & Plan Note (Signed)
-   Continue Trelegy Ellipta 126mg one puff daily  - Use Albuterol nebulizer every 8 hours for shortness of breath/cough

## 2021-03-24 DIAGNOSIS — H353211 Exudative age-related macular degeneration, right eye, with active choroidal neovascularization: Secondary | ICD-10-CM | POA: Diagnosis not present

## 2021-03-24 DIAGNOSIS — H43823 Vitreomacular adhesion, bilateral: Secondary | ICD-10-CM | POA: Diagnosis not present

## 2021-03-24 DIAGNOSIS — H353121 Nonexudative age-related macular degeneration, left eye, early dry stage: Secondary | ICD-10-CM | POA: Diagnosis not present

## 2021-03-24 DIAGNOSIS — H2513 Age-related nuclear cataract, bilateral: Secondary | ICD-10-CM | POA: Diagnosis not present

## 2021-05-19 DIAGNOSIS — H353211 Exudative age-related macular degeneration, right eye, with active choroidal neovascularization: Secondary | ICD-10-CM | POA: Diagnosis not present

## 2021-05-19 DIAGNOSIS — H353121 Nonexudative age-related macular degeneration, left eye, early dry stage: Secondary | ICD-10-CM | POA: Diagnosis not present

## 2021-05-19 DIAGNOSIS — H43812 Vitreous degeneration, left eye: Secondary | ICD-10-CM | POA: Diagnosis not present

## 2021-05-19 DIAGNOSIS — H43823 Vitreomacular adhesion, bilateral: Secondary | ICD-10-CM | POA: Diagnosis not present

## 2021-06-15 ENCOUNTER — Other Ambulatory Visit (HOSPITAL_COMMUNITY): Payer: Self-pay

## 2021-06-15 MED ORDER — LOSARTAN POTASSIUM-HCTZ 50-12.5 MG PO TABS: 1.0000 | ORAL_TABLET | Freq: Every day | ORAL | 0 refills | Status: DC

## 2021-06-18 ENCOUNTER — Encounter (HOSPITAL_COMMUNITY): Payer: Self-pay | Admitting: Internal Medicine

## 2021-06-18 ENCOUNTER — Ambulatory Visit (HOSPITAL_COMMUNITY)
Admission: RE | Admit: 2021-06-18 | Discharge: 2021-06-18 | Disposition: A | Discharge status: Home / Self Care | Payer: Medicare HMO | Source: Ambulatory Visit | Attending: Internal Medicine | Admitting: Internal Medicine

## 2021-06-18 ENCOUNTER — Other Ambulatory Visit: Payer: Self-pay

## 2021-06-18 VITALS — BP 128/64 | HR 78 | Ht 70.0 in | Wt 236.0 lb

## 2021-06-18 DIAGNOSIS — J449 Chronic obstructive pulmonary disease, unspecified: Secondary | ICD-10-CM | POA: Diagnosis not present

## 2021-06-18 DIAGNOSIS — I491 Atrial premature depolarization: Secondary | ICD-10-CM | POA: Insufficient documentation

## 2021-06-18 DIAGNOSIS — I480 Paroxysmal atrial fibrillation: Secondary | ICD-10-CM | POA: Diagnosis not present

## 2021-06-18 DIAGNOSIS — E785 Hyperlipidemia, unspecified: Secondary | ICD-10-CM | POA: Diagnosis not present

## 2021-06-18 DIAGNOSIS — K5190 Ulcerative colitis, unspecified, without complications: Secondary | ICD-10-CM | POA: Insufficient documentation

## 2021-06-18 DIAGNOSIS — I10 Essential (primary) hypertension: Secondary | ICD-10-CM | POA: Diagnosis not present

## 2021-06-18 DIAGNOSIS — E782 Mixed hyperlipidemia: Secondary | ICD-10-CM | POA: Diagnosis not present

## 2021-06-18 DIAGNOSIS — G4733 Obstructive sleep apnea (adult) (pediatric): Secondary | ICD-10-CM | POA: Insufficient documentation

## 2021-06-18 DIAGNOSIS — J984 Other disorders of lung: Secondary | ICD-10-CM | POA: Insufficient documentation

## 2021-06-18 DIAGNOSIS — I4510 Unspecified right bundle-branch block: Secondary | ICD-10-CM | POA: Insufficient documentation

## 2021-06-18 DIAGNOSIS — R911 Solitary pulmonary nodule: Secondary | ICD-10-CM | POA: Diagnosis not present

## 2021-06-18 DIAGNOSIS — I444 Left anterior fascicular block: Secondary | ICD-10-CM | POA: Diagnosis not present

## 2021-06-18 DIAGNOSIS — I440 Atrioventricular block, first degree: Secondary | ICD-10-CM | POA: Insufficient documentation

## 2021-06-18 DIAGNOSIS — Z79899 Other long term (current) drug therapy: Secondary | ICD-10-CM | POA: Diagnosis not present

## 2021-06-18 DIAGNOSIS — I493 Ventricular premature depolarization: Secondary | ICD-10-CM | POA: Diagnosis not present

## 2021-06-18 DIAGNOSIS — Z8701 Personal history of pneumonia (recurrent): Secondary | ICD-10-CM | POA: Diagnosis not present

## 2021-06-18 DIAGNOSIS — E669 Obesity, unspecified: Secondary | ICD-10-CM | POA: Diagnosis not present

## 2021-06-18 DIAGNOSIS — M25472 Effusion, left ankle: Secondary | ICD-10-CM | POA: Insufficient documentation

## 2021-06-18 DIAGNOSIS — Z6833 Body mass index (BMI) 33.0-33.9, adult: Secondary | ICD-10-CM | POA: Insufficient documentation

## 2021-06-18 DIAGNOSIS — Z9989 Dependence on other enabling machines and devices: Secondary | ICD-10-CM | POA: Diagnosis not present
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
## 2021-06-18 NOTE — Patient Instructions (Signed)
Thank you for your visit today.  There has been no changes to your medications.Your physician recommends that you schedule a follow-up appointment in: 1 year (February 2024)  ** please call the office in December 2023 to arrange your follow up appointment.  If you have any questions or concerns before your next appointment please send [place] a message through [place] or call our office at [phone].    TO LEAVE A MESSAGE FOR THE NURSE SELECT OPTION 2, PLEASE LEAVE A MESSAGE INCLUDING: YOUR NAME DATE OF BIRTH CALL BACK NUMBER REASON FOR CALL**this is important as we prioritize the call backs  YOU WILL RECEIVE A CALL BACK THE SAME DAY AS LONG AS YOU CALL BEFORE 4:00 PM  At the [facility], you and your health needs are our priority. As part of our continuing mission to provide you with exceptional heart care, we have created designated Provider Care Teams. These Care Teams include your primary Cardiologist (physician) and Advanced Practice Providers (APPs- Physician Assistants and Nurse Practitioners) who all work together to provide you with the care you need, when you need it.   You may see any of the following providers on your designated Care Team at your next follow up: Dr Glori Bickers Dr Haynes Kerns, NP Lyda Jester, [place] [facility] [place], [place] Audry Riles, PharmD   Please be sure to bring in all your medications bottles to every appointment.

## 2021-06-18 NOTE — Addendum Note (Signed)
Encounter addended by: Jerl Mina, RN on: 06/18/2021 12:01 PM  Actions taken: Order list changed, Clinical Note Signed

## 2021-06-22 ENCOUNTER — Telehealth: Payer: Self-pay | Admitting: Internal Medicine

## 2021-06-22 ENCOUNTER — Telehealth (HOSPITAL_COMMUNITY): Payer: Self-pay | Admitting: *Deleted

## 2021-06-22 DIAGNOSIS — G4733 Obstructive sleep apnea (adult) (pediatric): Secondary | ICD-10-CM

## 2021-06-22 NOTE — Telephone Encounter (Signed)
Orders placed for new cpap supplies. Nothing further needed.

## 2021-06-22 NOTE — Telephone Encounter (Signed)
Message sent to [facility] per Dr.Bensimhon refer to PharmD Clinic for GLP-1RA Body mass index is 33.86 kg/m.

## 2021-06-23 DIAGNOSIS — I2510 Atherosclerotic heart disease of native coronary artery without angina pectoris: Secondary | ICD-10-CM | POA: Diagnosis not present

## 2021-06-23 DIAGNOSIS — I10 Essential (primary) hypertension: Secondary | ICD-10-CM | POA: Diagnosis not present

## 2021-06-23 DIAGNOSIS — J449 Chronic obstructive pulmonary disease, unspecified: Secondary | ICD-10-CM | POA: Diagnosis not present

## 2021-06-23 DIAGNOSIS — E785 Hyperlipidemia, unspecified: Secondary | ICD-10-CM | POA: Diagnosis not present

## 2021-06-26 ENCOUNTER — Telehealth: Payer: Self-pay | Admitting: Pharmacist

## 2021-06-26 ENCOUNTER — Encounter: Payer: Self-pay | Admitting: Pharmacist

## 2021-06-26 MED ORDER — OZEMPIC (0.25 OR 0.5 MG/DOSE) 2 MG/1.5ML ~~LOC~~ SOPN: PEN_INJECTOR | SUBCUTANEOUS | 1 refills | Status: DC

## 2021-06-26 NOTE — Telephone Encounter (Signed)
Pt referred to PharmD by Dr Haroldine Laws to initiate [id number] therapy for weight loss. He has Medicare insurance so Wegovy/Saxenda are not covered. His insurance does cover Ozempic on formulary. If his plan changes in the future to one that requires a prior authorization, he will be denied as he does not have DM and this is off label use of Ozempic for weight loss. Anticipated copay would be $45/month. ? ?Called pt to discuss. He may be interested in starting therapy pending copay. Rx sent in to pharmacy, confirmed copay is $47/1 month supply. ? ?Called pt back. He is interested in starting therapy. Denies personal/family hx of thyroid cancer. Appt with PharmD has been scheduled, he'll bring Ozempic with him to appt. ?

## 2021-07-01 DIAGNOSIS — D6869 Other thrombophilia: Secondary | ICD-10-CM | POA: Diagnosis not present

## 2021-07-01 DIAGNOSIS — N529 Male erectile dysfunction, unspecified: Secondary | ICD-10-CM | POA: Diagnosis not present

## 2021-07-01 DIAGNOSIS — D649 Anemia, unspecified: Secondary | ICD-10-CM | POA: Diagnosis not present

## 2021-07-01 DIAGNOSIS — Z6834 Body mass index (BMI) 34.0-34.9, adult: Secondary | ICD-10-CM | POA: Diagnosis not present

## 2021-07-01 DIAGNOSIS — E785 Hyperlipidemia, unspecified: Secondary | ICD-10-CM | POA: Diagnosis not present

## 2021-07-01 DIAGNOSIS — E669 Obesity, unspecified: Secondary | ICD-10-CM | POA: Diagnosis not present

## 2021-07-01 DIAGNOSIS — I110 Hypertensive heart disease with heart failure: Secondary | ICD-10-CM | POA: Diagnosis not present

## 2021-07-01 DIAGNOSIS — J439 Emphysema, unspecified: Secondary | ICD-10-CM | POA: Diagnosis not present

## 2021-07-01 DIAGNOSIS — K5190 Ulcerative colitis, unspecified, without complications: Secondary | ICD-10-CM | POA: Diagnosis not present

## 2021-07-01 DIAGNOSIS — I509 Heart failure, unspecified: Secondary | ICD-10-CM | POA: Diagnosis not present

## 2021-07-01 DIAGNOSIS — I2510 Atherosclerotic heart disease of native coronary artery without angina pectoris: Secondary | ICD-10-CM | POA: Diagnosis not present

## 2021-07-01 DIAGNOSIS — I4891 Unspecified atrial fibrillation: Secondary | ICD-10-CM | POA: Diagnosis not present

## 2021-07-02 ENCOUNTER — Ambulatory Visit: Payer: Medicare HMO | Admitting: Pharmacist

## 2021-07-02 ENCOUNTER — Other Ambulatory Visit: Payer: Self-pay

## 2021-07-02 DIAGNOSIS — E669 Obesity, unspecified: Secondary | ICD-10-CM | POA: Diagnosis not present

## 2021-07-02 NOTE — Progress Notes (Signed)
Patient ID: Ricardo Cooper                 DOB: February 18, 1946                    MRN: [id number] ? ? ? ? ?HPI: ?Ricardo Cooper is a 76 y.o. male patient referred to pharmacy clinic by Dr Haroldine Laws to initiate weight loss therapy with GLP1-RA. PMH is significant for obesity, OSA on CPAP, HTN, HLD, COPD, UC, and traumatic accident in 2005 with a burst fracture of L1 complicated by sternal wound infection and atrial fibrillation, which has not recurred. CAC 09/2013 was 0. Most recent BMI 34. ? ?Pt has Medicare insurance so Wegovy/Saxenda are not covered. His insurance does cover Ozempic on formulary. If his plan changes in the future to one that requires a prior authorization, he will be denied as he does not have DM and this is off label use of Ozempic for weight loss. Copay is $47/month. No personal/family hx of thyroid cancer. ? ?Pt has not tried other medications for weight loss in the past. Does have UC that has been in remission for about 5 years. Reports A1c has increased in the past few years and is now in the diabetic range. Cannot see these labs from his PCP office in [place], [place] or [facility]. Uses [facility] [address] pharmacies primarily. ? ?Diet: Eating out more lately. If he eats a late breakfast, will skip lunch. Does like beef. Drinks diet soda. ? ?Exercise: SOB with mild activity, gardening and yardwork ? ?Family History: Father with heart disease and emphysema. Mother with allergies. Sister with asthma. Maternal grandfather with emphysema, heart disease and stroke. ? ?Social History: Former smoker 1 PPD for 35 years, quit in 2003. 3-4 drinks per week, denies drug use. ? ?Labs: 12/26/18: A1c 5.3% ? ?Wt Readings from Last 1 Encounters:  ?06/18/21 236 lb (107 kg)  ? ? ?BP Readings from Last 1 Encounters:  ?06/18/21 128/64  ? ?Pulse Readings from Last 1 Encounters:  ?06/18/21 78  ? ? ?   ?Component Value Date/Time  ? CHOL 147 09/23/2010 0854  ? TRIG 140.0 09/23/2010 0854  ? HDL 60.10  09/23/2010 0854  ? CHOLHDL 2 09/23/2010 0854  ? VLDL 28.0 09/23/2010 0854  ? [place] 59 09/23/2010 0854  ? LDLDIRECT 129.3 03/15/2007 0804  ? ? ?Past Medical History:  ?Diagnosis Date  ? Allergic rhinitis   ? Allergy   ? Cataract   ? beginnings of cataracts   ? CHF (congestive heart failure) ([place])   ? COPD (chronic obstructive pulmonary disease) ([place])   ? Diverticulosis   ? GERD (gastroesophageal reflux disease)   ? no issues with CPAP-1999  ? HTN (hypertension)   ? Hyperlipidemia   ? Obesity   ? Obstructive sleep apnea on CPAP   ? Paroxysmal atrial fibrillation ([place]) 2005  ? past trauma and infection  ? Skin cancer   ? basel cell on nose,arm,leg  ? Sleep apnea   ? with CPAP  ? Ulcerative colitis   ? Ulcerative colitis, left sided ([place])   ? ? ?Current Outpatient Medications on File Prior to Visit  ?Medication Sig Dispense Refill  ? acetaminophen (TYLENOL) 500 MG tablet Take 500 mg by mouth every 6 (six) hours as needed for mild pain.    ? albuterol (PROVENTIL) (2.5 MG/3ML) 0.083% nebulizer solution Take 3 mLs (2.5 mg total) by nebulization every 6 (six) hours as needed for wheezing or  shortness of breath. 75 mL 1  ? albuterol (VENTOLIN HFA) 108 (90 Base) MCG/ACT inhaler Inhale 2 puffs into the lungs every 6 (six) hours as needed.    ? benzonatate (TESSALON) 200 MG capsule Take 1 capsule (200 mg total) by mouth 3 (three) times daily as needed for cough. 30 capsule 1  ? dextromethorphan-guaiFENesin (MUCINEX DM) 30-600 MG 12hr tablet Take 1 tablet by mouth 2 (two) times daily. 15 tablet 0  ? diltiazem (CARDIZEM CD) 180 MG 24 hr capsule Take 1 capsule (180 mg total) by mouth daily. Please call for an office visit [phone] 90 capsule 3  ? ezetimibe (ZETIA) 10 MG tablet Take 10 mg by mouth daily.    ? fish oil-omega-3 fatty acids 1000 MG capsule Take 2 g by mouth daily.    ? folic acid (FOLVITE) 1 MG tablet Take 1 tablet (1 mg total) by mouth every morning. 90 tablet 3  ? hydrochlorothiazide (HYDRODIURIL) 12.5 MG  tablet Take 12.5 mg by mouth daily.    ? HYDROcodone bit-homatropine (HYCODAN) 5-1.5 MG/5ML syrup Take 5 mLs by mouth every 6 (six) hours as needed for cough. (Patient not taking: Reported on 06/18/2021) 240 mL 0  ? levofloxacin (LEVAQUIN) 500 MG tablet Take 1 tablet (500 mg total) by mouth daily. (Patient not taking: Reported on 06/18/2021) 7 tablet 0  ? losartan (COZAAR) 50 MG tablet Take 50 mg by mouth daily.    ? Melatonin 5 MG TABS Take 10 mg by mouth at bedtime.     ? meloxicam (MOBIC) 7.5 MG tablet Take 1 tablet by mouth as needed.    ? mercaptopurine (PURINETHOL) 50 MG tablet Take 2 tablets (100 mg total) by mouth daily. Give on an empty stomach 1 hour before or 2 hours after meals. Caution: Chemotherapy. 180 tablet 1  ? Multiple Vitamins-Minerals (PRESERVISION AREDS 2) CAPS Take 1 tablet by mouth 2 (two) times daily. (Patient not taking: Reported on 06/18/2021)    ? NIACINAMIDE PO Take 1 capsule by mouth 2 (two) times daily.    ? pravastatin (PRAVACHOL) 20 MG tablet Take 20 mg by mouth at bedtime.  4  ? Semaglutide,0.25 or 0.5MG/DOS, (OZEMPIC, 0.25 OR 0.5 MG/DOSE,) 2 MG/1.5ML SOPN Inject 0.52m subcutaneously once weekly for 4 weeks, then increase to 0.528msubcutaneously once weekly. 1.5 mL 1  ? sulfaSALAzine (AZULFIDINE) 500 MG tablet TAKE 2 TABLETS IN MORNING, 2 TABS AT LUNCH, AND 2 TABLETS AT DINNER. 540 tablet 3  ? tadalafil (CIALIS) 5 MG tablet Take 5 mg by mouth at bedtime.    ? tobramycin (TOBREX) 0.3 % ophthalmic solution Place 1 drop into both eyes in the morning, at noon, in the evening, and at bedtime. Once a month    ? TRELEGY ELLIPTA 100-62.5-25 MCG/INH AEPB INHALE 1 PUFF BY MOUTH EVERY DAY 60 each 12  ? vitamin B-12 (CYANOCOBALAMIN) 250 MCG tablet Take 1,000 mcg by mouth daily.    ? vitamin C (ASCORBIC ACID) 250 MG tablet Take 500 mg by mouth daily.    ? Vitamin D, Cholecalciferol, 25 MCG (1000 UT) CAPS Take 1 capsule by mouth daily.    ? [DISCONTINUED] CORTIFOAM 90 MG rectal foam     ?  [DISCONTINUED] diltiazem (DILACOR XR) 180 MG 24 hr capsule Take 1 capsule (180 mg total) by mouth daily. 30 capsule 6  ? ?No current facility-administered medications on file prior to visit.  ? ? ?Allergies  ?Allergen Reactions  ? Ibuprofen Other (See Comments)  ?  REACTION: causes  colitis flare ?Pt was told not to take Ibuprofen d/t reaction  ? ? ? ?Assessment/Plan: ? ?1. Weight loss - Patient has not met goal of at least 5% of body weight loss with comprehensive lifestyle modifications alone in the past 3-6 months. Pharmacotherapy is appropriate to pursue as augmentation. Will start Ozempic 0.58m once weekly. ? ?Advised patient on common side effects including nausea, diarrhea, dyspepsia, decreased appetite, and fatigue. Counseled patient on reducing meal size and how to titrate medication to minimize side effects. Counseled patient to call if intolerable side effects. Patient will adhere to dietary modifications, discussed increasing physical activity.  ? ?Injection technique reviewed at today's visit. Pt prefers to give first dose next week once he returns from vacation. He has given himself injections in the past. ? ?Titration Plan:  ?Will plan to follow the titration plan as below, pending patient is tolerating each dose before increasing to the next. Can slow titration if needed for tolerability.  ?  ?-Month 1: Inject Ozempic 0.252mSQ once weekly x 4 weeks ?-Month 2: Inject Ozempic 0.66m69mQ once weekly x 4 weeks ?-Month 3: Inject Ozempic 1mg34m once weekly x 4 weeks ?-Month 4+: Inject Ozempic 2mg 566monce weekly  ? ?Follow up in 1 month via telephone for dose titration. ? ?Basel Defalco E. Naiomi Musto, PharmD, BCACP, CPP ?[id number]en[id number]: [phone]: [phone]/2023 12:08 PM ? ?

## 2021-07-02 NOTE — Patient Instructions (Signed)
Ozempic Counseling Points This medication reduces your appetite and may make you feel fuller longer.  Stop eating when your body tells you that you are full. This will likely happen sooner than you are used to. Store your medication in the fridge until you are ready to use it. Inject your medication in the fatty tissue of your lower abdominal area (2 inches away from belly button) or upper outer thigh. Rotate injection sites. Each pen will last you about 1 month (the first month it will last a few weeks longer). Use a different needle with each weekly injection. Common side effects include: nausea, diarrhea/constipation, and heartburn, and are more likely to occur if you overeat.  Dosing schedule: - Month 1: Inject Ozempic 0.55m subcutaneously once weekly for 4 weeks - Month 2: Inject Ozempic 0.527msubcutaneously once weekly for 4 weeks - Month 3: Inject Ozempic 57m5mubcutaneously once weekly for 4 weeks - Month 4: Inject Ozempic 2mg29mbcutaneously once weekly  Tips for living a healthier life     Building a Healthy and Balanced Diet Make most of your meal vegetables and fruits -  of your plate. Aim for color and variety, and remember that potatoes dont count as vegetables on the Healthy Eating Plate because of their negative impact on blood sugar.  Go for whole grains -  of your plate. Whole and intact grains--whole wheat, barley, wheat berries, quinoa, oats, brown rice, and foods made with them, such as whole wheat pasta--have a milder effect on blood sugar and insulin than white bread, white rice, and other refined grains.  Protein power -  of your plate. Fish, poultry, beans, and nuts are all healthy, versatile protein sources--they can be mixed into salads, and pair well with vegetables on a plate. Limit red meat, and avoid processed meats such as bacon and sausage.  Healthy plant oils - in moderation. Choose healthy vegetable oils like olive, canola, soy, corn, sunflower,  peanut, and others, and avoid partially hydrogenated oils, which contain unhealthy trans fats. Remember that low-fat does not mean healthy.  Drink water, coffee, or tea. Skip sugary drinks, limit milk and dairy products to one to two servings per day, and limit juice to a small glass per day.  Stay active. The red figure running across the [place] reminder that staying active is also important in weight control.  The main message of the Healthy Eating Plate is to focus on diet quality:  The type of carbohydrate in the diet is more important than the amount of carbohydrate in the diet, because some sources of carbohydrate--like vegetables (other than potatoes), fruits, whole grains, and beans--are healthier than others. The Healthy Eating Plate also advises consumers to avoid sugary beverages, a major source of calories--usually with little nutritional value--in the American diet. The Healthy Eating Plate encourages consumers to use healthy oils, and it does not set a maximum on the percentage of calories people should get each day from healthy sources of fat. In this way, the Healthy Eating Plate recommends the opposite of the low-fat message promoted for decades by the USDA.  httpDeskDistributor.noGAR  Sugar is a huge problem in the modern day diet. Sugar is a big contributor to heart disease, diabetes, high triglyceride levels, fatty liver disease and obesity. Sugar is hidden in almost all packaged foods/beverages. Added sugar is extra sugar that is added beyond what is naturally found and has no nutritional benefit for your body. The American Heart Association recommends  limiting added sugars to no more than 25g for women and 36 grams for men per day. There are many names for sugar including maltose, sucrose (names ending in "ose"), high fructose corn syrup, molasses, cane sugar, corn sweetener, raw sugar, syrup,  honey or fruit juice concentrate.   One of the best ways to limit your added sugars is to stop drinking sweetened beverages such as soda, sweet tea, and fruit juice.  There is 65g of added sugars in one 20oz bottle of Coke! That is equal to 7.5 donuts.   Pay attention and read all nutrition facts labels. Below is an examples of a nutrition facts label. The #1 is showing you the total sugars where the # 2 is showing you the added sugars. This one serving has almost the max amount of added sugars per day!     20 oz Soda 65g Sugar = 7.5 Glazed Donuts  16oz Energy  Drink 54g Sugar = 6.5 Glazed Donuts  Large Sweet  Tea 38g Sugar = 4 Glazed Donuts  20oz Sports  Drink 34g Sugar = 3.5 Glazed Donuts  8oz Chocolate Milk 24g Sugar =2.5 Glazed Donuts  8oz Orange  Juice 21g Sugar = 2 Glazed Donuts  1 Juice Box 14g Sugar = 1.5 Glazed Donuts  16oz Water= NO SUGAR!!  EXERCISE  Exercise is good. Weve all heard that. In an ideal world, we would all have time and resources to get plenty of it. When you are active, your heart pumps more efficiently and you will feel better.  Multiple studies show that even walking regularly has benefits that include living a longer life. The American Heart Association recommends 150 minutes per week of exercise (30 minutes per day most days of the week). You can do this in any increment you wish. Nine or more 10-minute walks count. So does an hour-long exercise class. Break the time apart into what will work in your life. Some of the best things you can do include walking briskly, jogging, cycling or swimming laps. Not everyone is ready to exercise. Sometimes we need to start with just getting active. Here are some easy ways to be more active throughout the day:  Take the stairs instead of the elevator  Go for a 10-15 minute walk during your lunch break (find a friend to make it more enjoyable)  When shopping, park at the back of the parking lot  If you  take public transportation, get off one stop early and walk the extra distance  Pace around while making phone calls  Check with your doctor if you arent sure what your limitations may be. Always remember to drink plenty of water when doing any type of exercise. Dont feel like a failure if youre not getting the 90-150 minutes per week. If you started by being a couch potato, then just a 10-minute walk each day is a huge improvement. Start with little victories and work your way up.   HEALTHY EATING TIPS  When looking to improve your eating habits, whether to lose weight, lower blood pressure or just be healthier, it helps to know what a serving size is.   Grains 1 slice of bread,  bagel,  cup pasta or rice  Vegetables 1 cup fresh or raw vegetables,  cup cooked or canned Fruits 1 piece of medium sized fruit,  cup canned,   Meats/Proteins  cup dried       1 oz meat, 1 egg,  cup cooked beans, nuts or seeds  Dairy        Fats Individual yogurt container, 1 cup (8oz)    1 teaspoon margarine/butter or vegetable  milk or milk alternative, 1 slice of cheese          oil; 1 tablespoon mayonnaise or salad dressing                  Plan ahead: make a menu of the meals for a week then create a grocery list to go with that menu. Consider meals that easily stretch into a night of leftovers, such as stews or casseroles. Or consider making two of your favorite meal and put one in the freezer for another night. Try a night or two each week that is meatless or no cook such as salads. When you get home from the grocery store wash and prepare your vegetables and fruits. Then when you need them they are ready to go.   Tips for going to the grocery store:  [place] store or generic brands  Check the weekly ad from your store on-line or in their in-store flyer  Look at the unit price on the shelf tag to compare/contrast the costs of different items  Buy fruits/vegetables in season  Carrots, bananas and  apples are low-cost, naturally healthy items  If meats or frozen vegetables are on sale, buy some extras and put in your freezer  Limit buying prepared or ready to eat items, even if they are pre-made salads or fruit snacks  Do not shop when youre hungry  Foods at eye level tend to be more expensive. Look on the high and low shelves for deals.  Consider shopping at the farmers market for fresh foods in season.  Avoid the cookie and chip aisles (these are expensive, high in calories and low in nutritional value). Shop on the outside of the grocery store.  Healthy food preparations:  If you cant get lean hamburger, be sure to drain the fat when cooking  Steam, saut (in olive oil), grill or bake foods  Experiment with different seasonings to avoid adding salt to your foods. Kosher salt, sea salt and Himalayan salt are all still salt and should be avoided. Try seasoning food with onion, garlic, thyme, rosemary, basil ect. Onion powder or garlic powder is ok. Avoid if it says salt (ie garlic salt).

## 2021-07-03 DIAGNOSIS — G4733 Obstructive sleep apnea (adult) (pediatric): Secondary | ICD-10-CM | POA: Diagnosis not present

## 2021-07-09 DIAGNOSIS — G4733 Obstructive sleep apnea (adult) (pediatric): Secondary | ICD-10-CM | POA: Diagnosis not present

## 2021-07-10 DIAGNOSIS — H2513 Age-related nuclear cataract, bilateral: Secondary | ICD-10-CM | POA: Diagnosis not present

## 2021-07-10 DIAGNOSIS — H353211 Exudative age-related macular degeneration, right eye, with active choroidal neovascularization: Secondary | ICD-10-CM | POA: Diagnosis not present

## 2021-07-10 DIAGNOSIS — H353121 Nonexudative age-related macular degeneration, left eye, early dry stage: Secondary | ICD-10-CM | POA: Diagnosis not present

## 2021-07-10 DIAGNOSIS — H43823 Vitreomacular adhesion, bilateral: Secondary | ICD-10-CM | POA: Diagnosis not present

## 2021-07-21 DIAGNOSIS — D044 Carcinoma in situ of skin of scalp and neck: Secondary | ICD-10-CM | POA: Diagnosis not present

## 2021-07-21 DIAGNOSIS — L570 Actinic keratosis: Secondary | ICD-10-CM | POA: Diagnosis not present

## 2021-07-21 DIAGNOSIS — L820 Inflamed seborrheic keratosis: Secondary | ICD-10-CM | POA: Diagnosis not present

## 2021-07-21 DIAGNOSIS — D485 Neoplasm of uncertain behavior of skin: Secondary | ICD-10-CM | POA: Diagnosis not present

## 2021-07-21 DIAGNOSIS — C44619 Basal cell carcinoma of skin of left upper limb, including shoulder: Secondary | ICD-10-CM | POA: Diagnosis not present

## 2021-07-21 DIAGNOSIS — D0471 Carcinoma in situ of skin of right lower limb, including hip: Secondary | ICD-10-CM | POA: Diagnosis not present

## 2021-07-21 DIAGNOSIS — D1801 Hemangioma of skin and subcutaneous tissue: Secondary | ICD-10-CM | POA: Diagnosis not present

## 2021-07-21 DIAGNOSIS — L821 Other seborrheic keratosis: Secondary | ICD-10-CM | POA: Diagnosis not present

## 2021-07-21 DIAGNOSIS — B078 Other viral warts: Secondary | ICD-10-CM | POA: Diagnosis not present

## 2021-08-04 ENCOUNTER — Telehealth: Payer: Self-pay | Admitting: Pharmacist

## 2021-08-04 NOTE — Telephone Encounter (Signed)
Pt returned call and reports tolerating Ozempic well. Has had a decrease in his appetite. He will increase to 0.11m weekly for the next 6 weeks to use up the rest of his first pen and his second pen. I'll call him at that time for further titration. ?

## 2021-08-04 NOTE — Telephone Encounter (Signed)
Called pt to follow up with Ozempic tolerability and left message. If tolerating well, will increase to 0.20m weekly for the 2nd month. Current rx already has a refill on it. ?

## 2021-08-06 DIAGNOSIS — D0471 Carcinoma in situ of skin of right lower limb, including hip: Secondary | ICD-10-CM | POA: Diagnosis not present

## 2021-08-06 DIAGNOSIS — D044 Carcinoma in situ of skin of scalp and neck: Secondary | ICD-10-CM | POA: Diagnosis not present

## 2021-08-18 ENCOUNTER — Encounter: Payer: Self-pay | Admitting: Surgical

## 2021-08-18 ENCOUNTER — Ambulatory Visit (INDEPENDENT_AMBULATORY_CARE_PROVIDER_SITE_OTHER): Payer: Medicare HMO

## 2021-08-18 ENCOUNTER — Ambulatory Visit (INDEPENDENT_AMBULATORY_CARE_PROVIDER_SITE_OTHER): Payer: Medicare HMO | Admitting: Surgical

## 2021-08-18 ENCOUNTER — Ambulatory Visit: Payer: Self-pay

## 2021-08-18 DIAGNOSIS — M25512 Pain in left shoulder: Secondary | ICD-10-CM

## 2021-08-18 DIAGNOSIS — M19012 Primary osteoarthritis, left shoulder: Secondary | ICD-10-CM

## 2021-08-18 MED ORDER — METHYLPREDNISOLONE ACETATE 40 MG/ML IJ SUSP
40.0000 mg | INTRAMUSCULAR | Status: AC | PRN
  Administered 2021-08-18: 40 mg via INTRA_ARTICULAR

## 2021-08-18 MED ORDER — LIDOCAINE HCL 1 % IJ SOLN
5.0000 mL | INTRAMUSCULAR | Status: AC | PRN
  Administered 2021-08-18: 5 mL

## 2021-08-18 MED ORDER — BUPIVACAINE HCL 0.5 % IJ SOLN
9.0000 mL | INTRAMUSCULAR | Status: AC | PRN
  Administered 2021-08-18: 9 mL via INTRA_ARTICULAR

## 2021-08-18 NOTE — Progress Notes (Signed)
? ?Office Visit Note ?  ?Patient: Ricardo Cooper           ?Date of Birth: May 13, 1945           ?MRN: [id number] ?Visit Date: 08/18/2021 ?Requested by: Ricardo Organ., MD ?[address] ?[place],  [place] [address] ?PCP: Ricardo Organ., MD ? ?Subjective: ?Chief Complaint  ?Patient presents with  ? Left Shoulder - Pain  ? ? ?HPI: Ricardo Cooper is a 76 y.o. male who presents to the office complaining of left shoulder pain.  Patient notes to 3 months of shoulder pain without history of injury.  He has limited range of motion of the shoulder.  Shoulder bothers him with certain movements such as going overhead.  He has no previous difficulty with his shoulder such as previous shoulder surgery or dislocation.  Localizes pain to the lateral aspect of the left shoulder with no radiation of pain.  Denies any neck pain, numbness/tingling, shoulder blade pain.  Does have history of prior fall from a roof in 2005 which resulted in multiple rib fracture is and a burst fracture that required surgery.  He recovered well from that injury and states he never remembered injuring his shoulder.  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  Currently he enjoys gardening and lawn care.  He has tried taking Mobic without relief. ?             ?ROS: All systems reviewed are negative as they relate to the chief complaint within the history of present illness.  Patient denies fevers or chills. ? ?Assessment & Plan: ?Visit Diagnoses:  ?1. Primary osteoarthritis, left shoulder   ?2. Left shoulder pain, unspecified chronicity   ? ? ?Plan: Patient is a 76 year old male who presents for evaluation of left shoulder pain.  He has had left shoulder pain over the last 3 months that he rates as moderate.  Localizes to lateral aspect of the shoulder.  He has decreased range of motion of the left shoulder compared with the right on  exam today.  He has a recent CT chest from 2021 that was reviewed that shows mild osteophyte formation off the glenoid and radiographs of the shoulder today demonstrate osteophytic lipping of the inferior humeral head with no joint space narrowing the glenohumeral joint.  He did have ultrasound examination today that demonstrated fluid surrounding the bicep tendon in the bicipital groove which may indicate glenohumeral joint effusion.  With his stiffness on exam, history of physical labor, multiple imaging findings, impression is likely occult glenohumeral arthritis.  Recommended trying cortisone injection to see if this will help.  On ultrasound guidance, cortisone injection delivered into the glenohumeral joint.  He will call the office in 2 weeks to report if he has had relief or not; if no relief, plan for MRI arthrogram of the left shoulder for further evaluation of occult arthritis versus subscapularis tear based on his weakness on exam today.  Follow-up in 6 weeks regardless for clinical recheck with Ricardo Cooper. ? ?Follow-Up Instructions: No follow-ups on file.  ? ?Orders:  ?Orders Placed This Encounter  ?Procedures  ? XR Shoulder Left  ? US Guided Needle Placement - No Linked Charges  ? ?No orders of the defined types were placed in this encounter. ? ? ? ? Procedures: ?Large Joint Inj: L glenohumeral on 08/18/2021 12:18 PM ?Indications: diagnostic evaluation  and pain ?Details: 18 G 1.5 in needle, ultrasound-guided posterior approach ? ?Arthrogram: No ? ?Medications: 9 mL bupivacaine 0.5 %; 40 mg methylPREDNISolone acetate 40 MG/ML; 5 mL lidocaine 1 % ?Outcome: tolerated well, no immediate complications ?Procedure, treatment alternatives, risks and benefits explained, specific risks discussed. Consent was given by the patient. Immediately prior to procedure a time out was called to verify the correct patient, procedure, equipment, support staff and site/side marked as required. Patient was prepped and draped in  the usual sterile fashion.  ? ? ? ? ?Clinical Data: ?No additional findings. ? ?Objective: ?Vital Signs: There were no vitals taken for this visit. ? ?Physical Exam:  ?Constitutional: Patient appears well-developed ?HEENT:  ?Head: Normocephalic ?Eyes:EOM are normal ?Neck: Normal range of motion ?Cardiovascular: Normal rate ?Pulmonary/chest: Effort normal ?Neurologic: Patient is alert ?Skin: Skin is warm ?Psychiatric: Patient has normal mood and affect ? ?Ortho Exam: Ortho exam demonstrates right shoulder with 35 degrees external [place], 95 degrees abduction, 160 degrees forward flexion.  This compared with the left shoulder which has 15 degrees external rotation, 80 degrees abduction, 140 degrees forward flexion.  4/5 subscapularis strength of left shoulder compared with the right.  5/5 supraspinatus and infraspinatus strength.  Positive liftoff test.  Positive belly press test.  Moderate tenderness over the bicipital groove.  No tenderness over the [facility] joint.  5/5 motor strength of bilateral grip strength, finger abduction, pronation/supination, bicep, tricep, deltoid. ? ?Specialty Comments:  ?No specialty comments available. ? ?Imaging: ?No results found. ? ? ?PMFS History: ?Patient Active Problem List  ? Diagnosis Date Noted  ? Obesity (BMI 30-39.9) 07/02/2021  ? CAP (community acquired pneumonia) 03/18/2021  ? Plantar fasciitis 01/18/2020  ? Pain due to onychomycosis of toenails of both feet 09/18/2019  ? Multiple lung nodules 06/07/2018  ? Influenza with respiratory manifestation 06/06/2015  ? Ulcerative colitis, left sided ([place]) 05/03/2011  ? PURE HYPERCHOLESTEROLEMIA 03/23/2010  ? PRECORDIAL PAIN 03/23/2010  ? CHEST TIGHTNESS-PRESSURE-OTHER 03/18/2010  ? DYSPNEA 11/19/2008  ? HYPERLIPIDEMIA 02/01/2008  ? HYPERTENSION 02/01/2008  ? ATRIAL FIBRILLATION 02/01/2008  ? Allergic rhinitis 02/01/2008  ? COPD GOLD II 02/01/2008  ? Obstructive sleep apnea 02/01/2008  ? ?Past Medical History:  ?Diagnosis Date  ? Allergic  rhinitis   ? Allergy   ? Cataract   ? beginnings of cataracts   ? CHF (congestive heart failure) ([place])   ? COPD (chronic obstructive pulmonary disease) ([place])   ? Diverticulosis   ? GERD (gastroesophageal reflux disease)   ? no issues with CPAP-1999  ? HTN (hypertension)   ? Hyperlipidemia   ? Obesity   ? Obstructive sleep apnea on CPAP   ? Paroxysmal atrial fibrillation ([place]) 2005  ? past trauma and infection  ? Skin cancer   ? basel cell on nose,arm,leg  ? Sleep apnea   ? with CPAP  ? Ulcerative colitis   ? Ulcerative colitis, left sided ([place])   ?  ?Family History  ?Problem Relation Age of Onset  ? Emphysema Father   ? Heart disease Father   ? Allergies Mother   ? Asthma Sister   ? Emphysema Maternal Grandfather   ? Heart disease Maternal Grandfather   ? Stroke Maternal Grandfather   ? Colon cancer Neg Hx   ? Esophageal cancer Neg Hx   ? Stomach cancer Neg Hx   ? Rectal cancer Neg Hx   ?  ?Past Surgical History:  ?Procedure Laterality Date  ? BACK SURGERY  2005  ? [facility]    ?  bilateral  ? COLONOSCOPY  2013  ? Dr. Hilarie Fredrickson   ? FETAL SURGERY FOR CONGENITAL HERNIA  2001  ? infection in chest that had to be cleaned out    ? Staph Infection  ? POLYPECTOMY    ? SKIN CANCER EXCISION    ? TONSILLECTOMY    ? tubular adenoma  2013  ? UMBILICAL HERNIA REPAIR    ? VASECTOMY    ? ?Social History  ? ?Occupational History  ? Occupation: Physiological scientist  ?  Comment: [employer]  ?Tobacco Use  ? Smoking status: Former  ?  Packs/day: 1.00  ?  Years: 35.00  ?  Pack years: 35.00  ?  Types: Cigarettes  ?  Quit date: 04/26/2001  ?  Years since quitting: 20.3  ? Smokeless tobacco: Former  ?  Types: Chew  ?  Quit date: 06/25/1991  ? Tobacco comments:  ?  quit in 2005  ?Substance and Sexual Activity  ? Alcohol use: Yes  ?  Alcohol/week: 3.0 - 4.0 standard drinks  ?  Types: 3 - 4 Cans of beer per week  ? Drug use: No  ? Sexual activity: Not on file  ? ? ? ? ?  ?

## 2021-08-20 ENCOUNTER — Other Ambulatory Visit (HOSPITAL_COMMUNITY): Payer: Self-pay | Admitting: Internal Medicine

## 2021-08-21 DIAGNOSIS — H353121 Nonexudative age-related macular degeneration, left eye, early dry stage: Secondary | ICD-10-CM | POA: Diagnosis not present

## 2021-08-21 DIAGNOSIS — H353211 Exudative age-related macular degeneration, right eye, with active choroidal neovascularization: Secondary | ICD-10-CM | POA: Diagnosis not present

## 2021-08-21 DIAGNOSIS — H43823 Vitreomacular adhesion, bilateral: Secondary | ICD-10-CM | POA: Diagnosis not present

## 2021-08-21 DIAGNOSIS — H2513 Age-related nuclear cataract, bilateral: Secondary | ICD-10-CM | POA: Diagnosis not present

## 2021-09-01 ENCOUNTER — Telehealth: Payer: Self-pay | Admitting: Surgical

## 2021-09-01 NOTE — Telephone Encounter (Signed)
Patient called advised his left shoulder is at 90%. Patient said it is much better than it was.   The number to contact patient is [phone] ?

## 2021-09-01 NOTE — Telephone Encounter (Signed)
[facility] for now no mri scan indicated pls claal thcx

## 2021-09-02 NOTE — Telephone Encounter (Signed)
No MRI was placed for this pt ?

## 2021-09-03 ENCOUNTER — Other Ambulatory Visit: Payer: Self-pay | Admitting: Gastroenterology

## 2021-09-10 ENCOUNTER — Telehealth: Payer: Self-pay | Admitting: Pharmacist

## 2021-09-10 NOTE — Telephone Encounter (Signed)
Called pt to follow up with Ozempic tolerability and dose titration. Pt reports med has curbed his appetite, however he has been feeling nauseous and overall poor since being on Ozempic.  Has given 4 weeks of 0.66m and now 5 weeks of 0.582m Discussed strategies to decrease nausea including eating smaller more frequent snack-size portions throuhgout the day and avoiding greasy/fried/fast food. His nausea has not improved the longer he's been on Ozempic and he wishes to discontinue therapy. Med list has been updated, routing to [place] an [place]

## 2021-09-16 ENCOUNTER — Other Ambulatory Visit (HOSPITAL_COMMUNITY): Payer: Self-pay | Admitting: Internal Medicine

## 2021-09-23 DIAGNOSIS — J449 Chronic obstructive pulmonary disease, unspecified: Secondary | ICD-10-CM | POA: Diagnosis not present

## 2021-09-23 DIAGNOSIS — E785 Hyperlipidemia, unspecified: Secondary | ICD-10-CM | POA: Diagnosis not present

## 2021-09-23 DIAGNOSIS — I2510 Atherosclerotic heart disease of native coronary artery without angina pectoris: Secondary | ICD-10-CM | POA: Diagnosis not present

## 2021-09-23 DIAGNOSIS — I10 Essential (primary) hypertension: Secondary | ICD-10-CM | POA: Diagnosis not present

## 2021-09-29 DIAGNOSIS — H2513 Age-related nuclear cataract, bilateral: Secondary | ICD-10-CM | POA: Diagnosis not present

## 2021-09-29 DIAGNOSIS — H353211 Exudative age-related macular degeneration, right eye, with active choroidal neovascularization: Secondary | ICD-10-CM | POA: Diagnosis not present

## 2021-09-29 DIAGNOSIS — H43823 Vitreomacular adhesion, bilateral: Secondary | ICD-10-CM | POA: Diagnosis not present

## 2021-09-29 DIAGNOSIS — H353121 Nonexudative age-related macular degeneration, left eye, early dry stage: Secondary | ICD-10-CM | POA: Diagnosis not present

## 2021-10-02 ENCOUNTER — Ambulatory Visit: Payer: Medicare HMO | Admitting: Surgical

## 2021-10-09 ENCOUNTER — Other Ambulatory Visit (HOSPITAL_COMMUNITY): Payer: Self-pay | Admitting: Internal Medicine

## 2021-10-09 NOTE — Telephone Encounter (Signed)
Pt no longer on therapy. I called and confirmed he does not wish to take.

## 2021-10-23 DIAGNOSIS — H2513 Age-related nuclear cataract, bilateral: Secondary | ICD-10-CM | POA: Diagnosis not present

## 2021-10-23 DIAGNOSIS — H52203 Unspecified astigmatism, bilateral: Secondary | ICD-10-CM | POA: Diagnosis not present

## 2021-10-23 DIAGNOSIS — H353211 Exudative age-related macular degeneration, right eye, with active choroidal neovascularization: Secondary | ICD-10-CM | POA: Diagnosis not present

## 2021-10-25 ENCOUNTER — Other Ambulatory Visit (HOSPITAL_COMMUNITY): Payer: Self-pay | Admitting: Internal Medicine

## 2021-11-09 ENCOUNTER — Other Ambulatory Visit (HOSPITAL_BASED_OUTPATIENT_CLINIC_OR_DEPARTMENT_OTHER): Payer: Self-pay

## 2021-11-10 DIAGNOSIS — H353121 Nonexudative age-related macular degeneration, left eye, early dry stage: Secondary | ICD-10-CM | POA: Diagnosis not present

## 2021-11-10 DIAGNOSIS — H43823 Vitreomacular adhesion, bilateral: Secondary | ICD-10-CM | POA: Diagnosis not present

## 2021-11-10 DIAGNOSIS — H2513 Age-related nuclear cataract, bilateral: Secondary | ICD-10-CM | POA: Diagnosis not present

## 2021-11-10 DIAGNOSIS — H353211 Exudative age-related macular degeneration, right eye, with active choroidal neovascularization: Secondary | ICD-10-CM | POA: Diagnosis not present

## 2021-12-22 DIAGNOSIS — H43823 Vitreomacular adhesion, bilateral: Secondary | ICD-10-CM | POA: Diagnosis not present

## 2021-12-22 DIAGNOSIS — H353211 Exudative age-related macular degeneration, right eye, with active choroidal neovascularization: Secondary | ICD-10-CM | POA: Diagnosis not present

## 2021-12-22 DIAGNOSIS — H2513 Age-related nuclear cataract, bilateral: Secondary | ICD-10-CM | POA: Diagnosis not present

## 2021-12-22 DIAGNOSIS — H353121 Nonexudative age-related macular degeneration, left eye, early dry stage: Secondary | ICD-10-CM | POA: Diagnosis not present

## 2022-01-05 DIAGNOSIS — G4733 Obstructive sleep apnea (adult) (pediatric): Secondary | ICD-10-CM | POA: Diagnosis not present

## 2022-01-11 DIAGNOSIS — E291 Testicular hypofunction: Secondary | ICD-10-CM | POA: Diagnosis not present

## 2022-01-11 DIAGNOSIS — E785 Hyperlipidemia, unspecified: Secondary | ICD-10-CM | POA: Diagnosis not present

## 2022-01-11 DIAGNOSIS — R82998 Other abnormal findings in urine: Secondary | ICD-10-CM | POA: Diagnosis not present

## 2022-01-11 DIAGNOSIS — R7989 Other specified abnormal findings of blood chemistry: Secondary | ICD-10-CM | POA: Diagnosis not present

## 2022-01-11 DIAGNOSIS — Z125 Encounter for screening for malignant neoplasm of prostate: Secondary | ICD-10-CM | POA: Diagnosis not present

## 2022-01-11 DIAGNOSIS — I10 Essential (primary) hypertension: Secondary | ICD-10-CM | POA: Diagnosis not present

## 2022-01-11 DIAGNOSIS — R7301 Impaired fasting glucose: Secondary | ICD-10-CM | POA: Diagnosis not present

## 2022-01-18 DIAGNOSIS — I714 Abdominal aortic aneurysm, without rupture, unspecified: Secondary | ICD-10-CM | POA: Diagnosis not present

## 2022-01-18 DIAGNOSIS — J449 Chronic obstructive pulmonary disease, unspecified: Secondary | ICD-10-CM | POA: Diagnosis not present

## 2022-01-18 DIAGNOSIS — I10 Essential (primary) hypertension: Secondary | ICD-10-CM | POA: Diagnosis not present

## 2022-01-18 DIAGNOSIS — E669 Obesity, unspecified: Secondary | ICD-10-CM | POA: Diagnosis not present

## 2022-01-18 DIAGNOSIS — I2510 Atherosclerotic heart disease of native coronary artery without angina pectoris: Secondary | ICD-10-CM | POA: Diagnosis not present

## 2022-01-18 DIAGNOSIS — R7301 Impaired fasting glucose: Secondary | ICD-10-CM | POA: Diagnosis not present

## 2022-01-18 DIAGNOSIS — E785 Hyperlipidemia, unspecified: Secondary | ICD-10-CM | POA: Diagnosis not present

## 2022-01-18 DIAGNOSIS — I700 Atherosclerosis of aorta: Secondary | ICD-10-CM | POA: Diagnosis not present

## 2022-01-18 DIAGNOSIS — Z0000 Encounter for general adult medical examination without abnormal findings: Secondary | ICD-10-CM | POA: Diagnosis not present

## 2022-01-18 DIAGNOSIS — N400 Enlarged prostate without lower urinary tract symptoms: Secondary | ICD-10-CM | POA: Diagnosis not present

## 2022-01-18 DIAGNOSIS — D692 Other nonthrombocytopenic purpura: Secondary | ICD-10-CM | POA: Diagnosis not present

## 2022-01-18 DIAGNOSIS — K5190 Ulcerative colitis, unspecified, without complications: Secondary | ICD-10-CM | POA: Diagnosis not present

## 2022-02-01 ENCOUNTER — Telehealth: Payer: Self-pay

## 2022-02-02 NOTE — Telephone Encounter (Signed)
Error

## 2022-02-03 DIAGNOSIS — H43812 Vitreous degeneration, left eye: Secondary | ICD-10-CM | POA: Diagnosis not present

## 2022-02-03 DIAGNOSIS — H353121 Nonexudative age-related macular degeneration, left eye, early dry stage: Secondary | ICD-10-CM | POA: Diagnosis not present

## 2022-02-03 DIAGNOSIS — H353211 Exudative age-related macular degeneration, right eye, with active choroidal neovascularization: Secondary | ICD-10-CM | POA: Diagnosis not present

## 2022-02-03 DIAGNOSIS — H43823 Vitreomacular adhesion, bilateral: Secondary | ICD-10-CM | POA: Diagnosis not present

## 2022-02-08 DIAGNOSIS — D0471 Carcinoma in situ of skin of right lower limb, including hip: Secondary | ICD-10-CM | POA: Diagnosis not present

## 2022-02-08 DIAGNOSIS — C44321 Squamous cell carcinoma of skin of nose: Secondary | ICD-10-CM | POA: Diagnosis not present

## 2022-02-08 DIAGNOSIS — L570 Actinic keratosis: Secondary | ICD-10-CM | POA: Diagnosis not present

## 2022-02-08 DIAGNOSIS — Z85828 Personal history of other malignant neoplasm of skin: Secondary | ICD-10-CM | POA: Diagnosis not present

## 2022-02-08 DIAGNOSIS — L812 Freckles: Secondary | ICD-10-CM | POA: Diagnosis not present

## 2022-02-08 DIAGNOSIS — D485 Neoplasm of uncertain behavior of skin: Secondary | ICD-10-CM | POA: Diagnosis not present

## 2022-02-08 DIAGNOSIS — L821 Other seborrheic keratosis: Secondary | ICD-10-CM | POA: Diagnosis not present

## 2022-02-08 DIAGNOSIS — L820 Inflamed seborrheic keratosis: Secondary | ICD-10-CM | POA: Diagnosis not present

## 2022-02-12 ENCOUNTER — Ambulatory Visit (INDEPENDENT_AMBULATORY_CARE_PROVIDER_SITE_OTHER): Payer: Medicare HMO

## 2022-02-12 ENCOUNTER — Ambulatory Visit: Payer: Self-pay

## 2022-02-12 ENCOUNTER — Ambulatory Visit: Payer: Medicare HMO | Admitting: Surgical

## 2022-02-12 DIAGNOSIS — M19012 Primary osteoarthritis, left shoulder: Secondary | ICD-10-CM

## 2022-02-12 NOTE — Progress Notes (Signed)
Follow-up Office Visit Note   Patient: Ricardo Cooper           Date of Birth: 02/18/1946           MRN: [id number] Visit Date: 02/12/2022 Requested by: Ginger Organ., MD [address] [place],  [place] [address] PCP: Ginger Organ., MD  Subjective: Chief Complaint  Patient presents with   Left Shoulder - Pain    HPI: Ricardo Cooper is a 76 y.o. male who returns to the office for follow-up visit.    Plan at last visit was: Patient is a 75 year old male who presents for evaluation of left shoulder pain.  He has had left shoulder pain over the last 3 months that he rates as moderate.  Localizes to lateral aspect of the shoulder.  He has decreased range of motion of the left shoulder compared with the right on exam today.  He has a recent CT chest from 2021 that was reviewed that shows mild osteophyte formation off the glenoid and radiographs of the shoulder today demonstrate osteophytic lipping of the inferior humeral head with no joint space narrowing the glenohumeral joint.  He did have ultrasound examination today that demonstrated fluid surrounding the bicep tendon in the bicipital groove which may indicate glenohumeral joint effusion.  With his stiffness on exam, history of physical labor, multiple imaging findings, impression is likely occult glenohumeral arthritis.  Recommended trying cortisone injection to see if this will help.  On ultrasound guidance, cortisone injection delivered into the glenohumeral joint.  He will call the office in 2 weeks to report if he has had relief or not; if no relief, plan for MRI arthrogram of the left shoulder for further evaluation of occult arthritis versus subscapularis tear based on his weakness on exam today.  Follow-up in 6 weeks regardless for clinical recheck with Dr. Marlou Sa.  Since then, patient notes he had 90% relief from cortisone injection that was noted in phone call from 09/01/2021.  This injection only lasted for about 2 weeks  before pain began to recur.  He describes constant aching and painful range of motion.  Pain diffusely throughout the lateral aspect of the shoulder with some radiation down to the elbow.  Wakes him up at night pretty much every night.  No history of prior surgery to his left shoulder.  Does a lot of physical work taking care of his home.  Involves lifting up to 50 pounds.  No neck pain, scapular pain, numbness/tingling in the arm.              ROS: All systems reviewed are negative as they relate to the chief complaint within the history of present illness.  Patient denies fevers or chills.  Assessment & Plan: Visit Diagnoses:  1. Primary osteoarthritis, left shoulder     Plan: Ricardo Cooper is a 76 y.o. male who returns to the office for follow-up visit for left shoulder pain.  Plan from last visit was noted above in HPI.  They now return with significant improvement but short-lived relief from ultrasound-guided glenohumeral injection that was administered at the last office visit.  Unfortunately only lasted a couple weeks and he has noted worsening aching pain that wakes him up from sleep over the last 5 to 6 months.  New radiographs were taken today demonstrating fairly well-preserved joint space between the humeral head and glenoid but there is a small inferior humeral head osteophyte which, along with his significant limited range  of motion, may signify occult arthritis of the left shoulder.  After discussion of options today including repeat injection versus planning for surgery versus physical therapy versus living with his symptoms, patient would like to try another ultrasound-guided glenohumeral injection.  If this only last 2 to 3 weeks like it did last time then he will call the office at that point and we will order MRI arthrogram of the left shoulder.  Patient and wife agreed with plan.  Follow-Up Instructions: No follow-ups on file.   Orders:  Orders Placed This Encounter   Procedures   XR Shoulder Left   US Guided Needle Placement - No Linked Charges   No orders of the defined types were placed in this encounter.     Procedures: Large Joint Inj: L glenohumeral on 02/12/2022 9:25 AM Indications: diagnostic evaluation and pain Details: 22 G 3.5 in needle, ultrasound-guided posterior approach  Arthrogram: No  Medications: 9 mL bupivacaine 0.5 %; 40 mg methylPREDNISolone acetate 40 MG/ML; 5 mL lidocaine 1 % Outcome: tolerated well, no immediate complications Procedure, treatment alternatives, risks and benefits explained, specific risks discussed. Consent was given by the patient. Immediately prior to procedure a time out was called to verify the correct patient, procedure, equipment, support staff and site/side marked as required. Patient was prepped and draped in the usual sterile fashion.       Clinical Data: No additional findings.  Objective: Vital Signs: There were no vitals taken for this visit.  Physical Exam:  Constitutional: Patient appears well-developed HEENT:  Head: Normocephalic Eyes:EOM are normal Neck: Normal range of motion Cardiovascular: Normal rate Pulmonary/chest: Effort normal Neurologic: Patient is alert Skin: Skin is warm Psychiatric: Patient has normal mood and affect  Ortho Exam: Ortho exam demonstrates right shoulder with 30 degrees X rotation, 100 degrees abduction, 130 degrees forward flexion.  This compared with the left shoulder with 10 degrees external rotation, 70 degrees abduction, 100 degrees forward flexion.  Active range of motion: To passive range of motion.  Excellent rotator cuff strength of left shoulder supra, infra, subscap rated 5/5.  Axillary nerve is intact with deltoid firing.  He has mild tenderness over the bicipital groove.  No tenderness of the AC joint.  2+ radial pulse of the left upper extremity.  Intact EPL, FPL, finger abduction, finger adduction, pronation/supination, bicep, tricep.  No  tenderness along the axial cervical spine.  Specialty Comments:  No specialty comments available.  Imaging: No results found.   PMFS History: Patient Active Problem List   Diagnosis Date Noted   Obesity (BMI 30-39.9) 07/02/2021   CAP (community acquired pneumonia) 03/18/2021   Plantar fasciitis 01/18/2020   Pain due to onychomycosis of toenails of both feet 09/18/2019   Multiple lung nodules 06/07/2018   Influenza with respiratory manifestation 06/06/2015   Ulcerative colitis, left sided ([place]) 05/03/2011   PURE HYPERCHOLESTEROLEMIA 03/23/2010   PRECORDIAL PAIN 03/23/2010   CHEST TIGHTNESS-PRESSURE-OTHER 03/18/2010   DYSPNEA 11/19/2008   HYPERLIPIDEMIA 02/01/2008   HYPERTENSION 02/01/2008   ATRIAL FIBRILLATION 02/01/2008   Allergic rhinitis 02/01/2008   COPD GOLD II 02/01/2008   Obstructive sleep apnea 02/01/2008   Past Medical History:  Diagnosis Date   Allergic rhinitis    Allergy    Cataract    beginnings of cataracts    CHF (congestive heart failure) ([facility])    COPD (chronic obstructive pulmonary disease) ([facility])    Diverticulosis    GERD (gastroesophageal reflux disease)    no issues with CPAP-1999   HTN (hypertension)  Hyperlipidemia    Obesity    Obstructive sleep apnea on CPAP    Paroxysmal atrial fibrillation ([place]) 2005   past trauma and infection   Skin cancer    basel cell on nose,arm,leg   Sleep apnea    with CPAP   Ulcerative colitis    Ulcerative colitis, left sided ([place])     Family History  Problem Relation Age of Onset   Emphysema Father    Heart disease Father    Allergies Mother    Asthma Sister    Emphysema Maternal Grandfather    Heart disease Maternal Grandfather    Stroke Maternal Grandfather    Colon cancer Neg Hx    Esophageal cancer Neg Hx    Stomach cancer Neg Hx    Rectal cancer Neg Hx     Past Surgical History:  Procedure Laterality Date   BACK SURGERY  2005   BELPHAROPTOSIS REPAIR     bilateral   COLONOSCOPY  2013    Dr. Hilarie Fredrickson    FETAL SURGERY FOR CONGENITAL HERNIA  2001   infection in chest that had to be cleaned out     Staph Infection   POLYPECTOMY     SKIN CANCER EXCISION     TONSILLECTOMY     tubular adenoma  4854   UMBILICAL HERNIA REPAIR     VASECTOMY     Social History   Occupational History   Occupation: Physiological scientist    Comment: [employer]  Tobacco Use   Smoking status: Former    Packs/day: 1.00    Years: 35.00    Total pack years: 35.00    Types: Cigarettes    Quit date: 04/26/2001    Years since quitting: 20.8   Smokeless tobacco: Former    Types: Chew    Quit date: 06/25/1991   Tobacco comments:    quit in 2005  Substance and Sexual Activity   Alcohol use: Yes    Alcohol/week: 3.0 - 4.0 standard drinks of alcohol    Types: 3 - 4 Cans of beer per week   Drug use: No   Sexual activity: Not on file

## 2022-02-13 ENCOUNTER — Encounter: Payer: Self-pay | Admitting: Surgical

## 2022-02-13 MED ORDER — LIDOCAINE HCL 1 % IJ SOLN
5.0000 mL | INTRAMUSCULAR | Status: AC | PRN
  Administered 2022-02-12: 5 mL

## 2022-02-13 MED ORDER — METHYLPREDNISOLONE ACETATE 40 MG/ML IJ SUSP
40.0000 mg | INTRAMUSCULAR | Status: AC | PRN
  Administered 2022-02-12: 40 mg via INTRA_ARTICULAR

## 2022-02-13 MED ORDER — BUPIVACAINE HCL 0.5 % IJ SOLN
9.0000 mL | INTRAMUSCULAR | Status: AC | PRN
  Administered 2022-02-12: 9 mL via INTRA_ARTICULAR

## 2022-02-20 DIAGNOSIS — Z23 Encounter for immunization: Secondary | ICD-10-CM | POA: Diagnosis not present

## 2022-03-04 DIAGNOSIS — C44321 Squamous cell carcinoma of skin of nose: Secondary | ICD-10-CM | POA: Diagnosis not present

## 2022-03-06 ENCOUNTER — Other Ambulatory Visit: Payer: Self-pay | Admitting: Gastroenterology

## 2022-03-06 ENCOUNTER — Other Ambulatory Visit (HOSPITAL_COMMUNITY): Payer: Self-pay | Admitting: Internal Medicine

## 2022-03-08 NOTE — Telephone Encounter (Signed)
Please advise patient's refill requests.

## 2022-03-11 DIAGNOSIS — H353211 Exudative age-related macular degeneration, right eye, with active choroidal neovascularization: Secondary | ICD-10-CM | POA: Diagnosis not present

## 2022-03-12 ENCOUNTER — Telehealth: Payer: Self-pay | Admitting: Gastroenterology

## 2022-03-12 DIAGNOSIS — Z1152 Encounter for screening for COVID-19: Secondary | ICD-10-CM | POA: Diagnosis not present

## 2022-03-12 DIAGNOSIS — J029 Acute pharyngitis, unspecified: Secondary | ICD-10-CM | POA: Diagnosis not present

## 2022-03-12 DIAGNOSIS — U071 COVID-19: Secondary | ICD-10-CM | POA: Diagnosis not present

## 2022-03-12 DIAGNOSIS — R5383 Other fatigue: Secondary | ICD-10-CM | POA: Diagnosis not present

## 2022-03-12 DIAGNOSIS — R0981 Nasal congestion: Secondary | ICD-10-CM | POA: Diagnosis not present

## 2022-03-12 DIAGNOSIS — R058 Other specified cough: Secondary | ICD-10-CM | POA: Diagnosis not present

## 2022-03-12 DIAGNOSIS — I10 Essential (primary) hypertension: Secondary | ICD-10-CM | POA: Diagnosis not present

## 2022-03-12 DIAGNOSIS — J449 Chronic obstructive pulmonary disease, unspecified: Secondary | ICD-10-CM | POA: Diagnosis not present

## 2022-03-12 MED ORDER — MERCAPTOPURINE 50 MG PO TABS: ORAL_TABLET | ORAL | 0 refills | Status: DC

## 2022-03-12 MED ORDER — SULFASALAZINE 500 MG PO TABS: ORAL_TABLET | ORAL | 0 refills | Status: DC

## 2022-03-12 NOTE — Telephone Encounter (Signed)
Refilled Sulfasalazine and Mercaptopurine for 90 days.

## 2022-03-12 NOTE — Telephone Encounter (Signed)
Inbound call from patients pharmacy requesting that since patient has scheduled a follow up appointment that we resend prescriptions for  Salfasalazine and Mercaptopurine has 90 days instead of 30 days. Please advise.

## 2022-04-28 ENCOUNTER — Ambulatory Visit: Payer: Medicare HMO | Admitting: Gastroenterology

## 2022-04-28 ENCOUNTER — Encounter: Payer: Self-pay | Admitting: Gastroenterology

## 2022-04-28 VITALS — BP 118/66 | HR 67 | Ht 71.5 in | Wt 245.5 lb

## 2022-04-28 DIAGNOSIS — Z860100 Personal history of colon polyps, unspecified: Secondary | ICD-10-CM | POA: Insufficient documentation

## 2022-04-28 DIAGNOSIS — K51919 Ulcerative colitis, unspecified with unspecified complications: Secondary | ICD-10-CM

## 2022-04-28 DIAGNOSIS — H43823 Vitreomacular adhesion, bilateral: Secondary | ICD-10-CM | POA: Diagnosis not present

## 2022-04-28 DIAGNOSIS — H2513 Age-related nuclear cataract, bilateral: Secondary | ICD-10-CM | POA: Diagnosis not present

## 2022-04-28 DIAGNOSIS — H353211 Exudative age-related macular degeneration, right eye, with active choroidal neovascularization: Secondary | ICD-10-CM | POA: Diagnosis not present

## 2022-04-28 DIAGNOSIS — H353121 Nonexudative age-related macular degeneration, left eye, early dry stage: Secondary | ICD-10-CM | POA: Diagnosis not present

## 2022-04-28 MED ORDER — NA SULFATE-K SULFATE-MG SULF 17.5-3.13-1.6 GM/177ML PO SOLN: 1.0000 | Freq: Once | ORAL | 0 refills | Status: AC

## 2022-04-28 MED ORDER — MERCAPTOPURINE 50 MG PO TABS: 100.0000 mg | ORAL_TABLET | Freq: Every day | ORAL | 3 refills | Status: DC

## 2022-04-28 MED ORDER — FOLIC ACID 1 MG PO TABS: 1.0000 mg | ORAL_TABLET | Freq: Every morning | ORAL | 3 refills | Status: DC

## 2022-04-28 MED ORDER — SULFASALAZINE 500 MG PO TABS: 1000.0000 mg | ORAL_TABLET | Freq: Three times a day (TID) | ORAL | 3 refills | Status: DC

## 2022-04-28 NOTE — Progress Notes (Signed)
04/28/2022 Ricardo Cooper [id number] April 04, 1946   HISTORY OF PRESENT ILLNESS:  This is a 77 year old male who is a patient of Dr. Vena Rua and follows here for history of left-sided ulcerative colitis.  He comes in today for routine follow-up and need for refills on his medications and is overdue for colonoscopy.  He also has history of hypertension, atrial fibrillation, COPD, sleep apnea, hyperlipidemia, adenomatous colon polyps, diverticulosis.  He was last seen here in 01/2021.  His last colonoscopy was in January 2020 at which time he was found to have 5 polyps, the largest 10 mm.  Pathology showed 2 of the polyps were tubular adenomas and 3 were hyperplastic.  He had normal appearing colonic mucosa throughout and multiple diverticuli in the descending and sigmoid colon.  He has been maintained on mercaptopurine 100 mg daily, sulfasalazine 2000 mg 3 times daily, and folic acid 1 mg daily.  He has been doing really well.  Says that he has been in remission.  He usually has a BM at least every other day.  No rectal bleeding.  CBC and CMP as well as TSH at his PCP's office on 12/2021 were within normal limits.   Colonoscopy 04/2018:  -One 4 mm polyp in the cecum, removed with a cold snare. Complete resection. Polyp tissuenot retrieved. - One 10 mm polyp in the transverse colon, removed with a hot snare. Resected and retrieved. - One 5 mm polyp in the sigmoid colon, removed with a cold snare. Resected and retrieved. - Diverticulosis in the sigmoid colon and in the descending colon. - Two 4 to 5 mm polyps in the rectum, removed with a cold snare. Resected and retrieved. 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 Biopsied.  1. Surgical [P], transverse colon, polyp - TUBULAR ADENOMA(S) - NEGATIVE FOR HIGH-GRADE DYSPLASIA OR MALIGNANCY 2. Surgical [P], left colon bx - COLONIC MUCOSA WITHIN NORMAL LIMITS. SEE NOTE - NEGATIVE FOR GRANULOMAS OR DYSPLASIA 3. Surgical [P], sigmoid colon,  polyp - HYPERPLASTIC POLYP 4. Surgical [P], rectum, polyp (2) - HYPERPLASTIC POLYP(S)   Past Medical History:  Diagnosis Date   Allergic rhinitis    Allergy    Cataract    beginnings of cataracts    CHF (congestive heart failure) (HCC)    COPD (chronic obstructive pulmonary disease) (HCC)    Diverticulosis    GERD (gastroesophageal reflux disease)    no issues with CPAP-1999   HTN (hypertension)    Hyperlipidemia    Obesity    Obstructive sleep apnea on CPAP    Paroxysmal atrial fibrillation ([place]) 2005   past trauma and infection   Skin cancer    basel cell on nose,arm,leg   Sleep apnea    with CPAP   Ulcerative colitis    Ulcerative colitis, left sided [facility])    Past Surgical History:  Procedure Laterality Date   BACK SURGERY  2005   BELPHAROPTOSIS REPAIR     bilateral   COLONOSCOPY  2013   Dr. Hilarie Fredrickson    FETAL SURGERY FOR CONGENITAL HERNIA  2001   infection in chest that had to be cleaned out     Staph Infection   POLYPECTOMY     SKIN CANCER EXCISION     TONSILLECTOMY     tubular adenoma  6761   [place]      reports that he quit smoking about 21 years ago. His smoking use included cigarettes. He has a 35.00 pack-year smoking history. He  quit smokeless tobacco use about 30 years ago.  His smokeless tobacco use included chew. He reports current alcohol use of about 3.0 - 4.0 standard drinks of alcohol per week. He reports that he does not use drugs. family history includes Allergies in his mother; Asthma in his sister; Emphysema in his father and maternal grandfather; Heart disease in his father and maternal grandfather; Stroke in his maternal grandfather. Allergies  Allergen Reactions   Ibuprofen Other (See Comments)    REACTION: causes colitis flare Pt was told not to take Ibuprofen d/t reaction   Ozempic (0.25 Or 0.5 Mg-Dose) [Semaglutide(0.25 Or 0.17m-Dos)]     nausea      Outpatient Encounter Medications as of 04/28/2022   Medication Sig   acetaminophen (TYLENOL) 500 MG tablet Take 500 mg by mouth every 6 (six) hours as needed for mild pain.   aflibercept (EYLEA HD) 8 MG/0.07ML SOLN by Intravitreal route every 6 (six) weeks.   albuterol (VENTOLIN HFA) 108 (90 Base) MCG/ACT inhaler Inhale 2 puffs into the lungs every 6 (six) hours as needed.   diltiazem (CARDIZEM CD) 180 MG 24 hr capsule TAKE ONE CAPSULE BY MOUTH ONCE DAILY   ezetimibe (ZETIA) 10 MG tablet Take 10 mg by mouth daily.   fish oil-omega-3 fatty acids 1000 MG capsule Take 2 g by mouth daily.   losartan-hydrochlorothiazide (HYZAAR) 50-12.5 MG tablet TAKE ONE TABLET BY MOUTH ONCE DAILY   Melatonin 5 MG TABS Take 10 mg by mouth at bedtime.    Na Sulfate-K Sulfate-Mg Sulf 17.5-3.13-1.6 GM/177ML SOLN Take 1 kit by mouth once for 1 dose.   pravastatin (PRAVACHOL) 20 MG tablet Take 20 mg by mouth at bedtime.   tadalafil (CIALIS) 5 MG tablet Take 5 mg by mouth at bedtime.   tobramycin (TOBREX) 0.3 % ophthalmic solution Place 1 drop into both eyes in the morning, at noon, in the evening, and at bedtime. Once a month   TRELEGY ELLIPTA 100-62.5-25 MCG/INH AEPB INHALE 1 PUFF BY MOUTH EVERY DAY   vitamin B-12 (CYANOCOBALAMIN) 250 MCG tablet Take 1,000 mcg by mouth daily.   vitamin C (ASCORBIC ACID) 250 MG tablet Take 500 mg by mouth daily.   Vitamin D, Cholecalciferol, 25 MCG (1000 UT) CAPS Take 1 capsule by mouth daily.   [DISCONTINUED] folic acid (FOLVITE) 1 MG tablet TAKE ONE TABLET BY MOUTH EVERY MORNING   [DISCONTINUED] mercaptopurine (PURINETHOL) 50 MG tablet TAKE TWO TABLETS BY MOUTH BEFORE BREAKFAST   [DISCONTINUED] sulfaSALAzine (AZULFIDINE) 500 MG tablet TAKE TWO TABLETS BY MOUTH THREE TIMES DAILY   folic acid (FOLVITE) 1 MG tablet Take 1 tablet (1 mg total) by mouth every morning.   mercaptopurine (PURINETHOL) 50 MG tablet Take 2 tablets (100 mg total) by mouth daily. TAKE TWO TABLETS BY MOUTH BEFORE BREAKFAST   sulfaSALAzine (AZULFIDINE) 500 MG tablet  Take 2 tablets (1,000 mg total) by mouth 3 (three) times daily. TAKE TWO TABLETS BY MOUTH THREE TIMES DAILY   [DISCONTINUED] CORTIFOAM 90 MG rectal foam    [DISCONTINUED] diltiazem (DILACOR XR) 180 MG 24 hr capsule Take 1 capsule (180 mg total) by mouth daily.   [DISCONTINUED] hydrochlorothiazide (HYDRODIURIL) 12.5 MG tablet Take 12.5 mg by mouth daily.   [DISCONTINUED] losartan (COZAAR) 50 MG tablet Take 50 mg by mouth daily.   [DISCONTINUED] NIACINAMIDE PO Take 1 capsule by mouth 2 (two) times daily.   No facility-administered encounter medications on file as of 04/28/2022.    REVIEW OF SYSTEMS  : All other systems reviewed  and negative except where noted in the History of Present Illness.   PHYSICAL EXAM: BP 118/66   Pulse 67   Ht 5' 11.5" (1.816 m)   Wt 245 lb 8 oz (111.4 kg)   BMI 33.76 kg/m  General: Well developed white male in no acute distress Head: Normocephalic and atraumatic Eyes:  Sclerae anicteric, conjunctiva pink. Ears: Normal auditory acuity Lungs: Clear throughout to auscultation; no W/R/R. Heart: Regular rate and rhythm; no M/R/G. Abdomen: Soft, non-distended.  BS present.  Non-tender. Rectal:  Will be done at the time of colonoscopy. Musculoskeletal: Symmetrical with no gross deformities  Skin: No lesions on visible extremities Extremities: No edema  Neurological: Alert oriented x 4, grossly non-focal Psychological:  Alert and cooperative. Normal mood and affect  ASSESSMENT AND PLAN: *77 year old male with left-sided ulcerative colitis in remission on current regimen *History of adenomatous colon polyps with last colonoscopy January 2020, was due for follow-up January 2023  -He will continue his sulfasalazine 1 g 3 times daily, mercaptopurine 100 mg daily, and folic acid 1 mg daily.  Prescriptions sent to his pharmacy with enough refills for 1 year. -He just had labs performed by his PCP in September 2023 and we were able to access those so he does not need  new labs drawn today. -Will schedule colonoscopy with Dr. Hilarie Fredrickson.  The risks, benefits, and alternatives to colonoscopy were discussed with the patient and he consents to proceed.     CC:  Ginger Organ., MD

## 2022-04-28 NOTE — Patient Instructions (Addendum)
We have sent the following medications to your pharmacy for you to pick up at your convenience: Mercaptopurine 100 mg daily.  Sulfasalazine 1000 mg three times daily.  Suprep.  You have been scheduled for a colonoscopy. Please follow written instructions given to you at your visit today.  Please pick up your prep supplies at the pharmacy within the next 1-3 days. If you use inhalers (even only as needed), please bring them with you on the day of your procedure.  _______________________________________________________  If you are age 76 or older, your body mass index should be between 23-30. Your Body mass index is 33.76 kg/m. If this is out of the aforementioned range listed, please consider follow up with your Primary Care Provider.  If you are age 32 or younger, your body mass index should be between 19-25. Your Body mass index is 33.76 kg/m. If this is out of the aformentioned range listed, please consider follow up with your Primary Care Provider.   ________________________________________________________  The [facility] providers would like to encourage you to use [facility] to communicate with providers for non-urgent requests or questions.  Due to long hold times on the telephone, sending your provider a message by [facility] may be a faster and more efficient way to get a response.  Please allow 48 business hours for a response.  Please remember that this is for non-urgent requests.  _______________________________________________________

## 2022-04-30 NOTE — Progress Notes (Signed)
Addendum: Reviewed and agree with assessment and management plan. Shekia Kuper M, MD  

## 2022-05-10 DIAGNOSIS — N390 Urinary tract infection, site not specified: Secondary | ICD-10-CM | POA: Diagnosis not present

## 2022-05-10 DIAGNOSIS — M545 Low back pain, unspecified: Secondary | ICD-10-CM | POA: Diagnosis not present

## 2022-05-10 DIAGNOSIS — N451 Epididymitis: Secondary | ICD-10-CM | POA: Diagnosis not present

## 2022-05-10 DIAGNOSIS — R3129 Other microscopic hematuria: Secondary | ICD-10-CM | POA: Diagnosis not present

## 2022-05-10 DIAGNOSIS — N5089 Other specified disorders of the male genital organs: Secondary | ICD-10-CM | POA: Diagnosis not present

## 2022-05-19 ENCOUNTER — Ambulatory Visit: Payer: Medicare HMO | Admitting: Surgical

## 2022-05-19 ENCOUNTER — Ambulatory Visit: Payer: Self-pay

## 2022-05-19 ENCOUNTER — Ambulatory Visit (INDEPENDENT_AMBULATORY_CARE_PROVIDER_SITE_OTHER): Payer: Medicare HMO

## 2022-05-19 DIAGNOSIS — M545 Low back pain, unspecified: Secondary | ICD-10-CM

## 2022-05-19 DIAGNOSIS — M19012 Primary osteoarthritis, left shoulder: Secondary | ICD-10-CM

## 2022-05-20 ENCOUNTER — Encounter: Payer: Self-pay | Admitting: Surgical

## 2022-05-20 MED ORDER — LIDOCAINE HCL 1 % IJ SOLN
5.0000 mL | INTRAMUSCULAR | Status: AC | PRN
  Administered 2022-05-19: 5 mL

## 2022-05-20 MED ORDER — BUPIVACAINE HCL 0.5 % IJ SOLN
9.0000 mL | INTRAMUSCULAR | Status: AC | PRN
  Administered 2022-05-19: 9 mL via INTRA_ARTICULAR

## 2022-05-20 MED ORDER — METHYLPREDNISOLONE ACETATE 40 MG/ML IJ SUSP
40.0000 mg | INTRAMUSCULAR | Status: AC | PRN
  Administered 2022-05-19: 40 mg via INTRA_ARTICULAR

## 2022-05-20 NOTE — Progress Notes (Signed)
Follow-up Office Visit Note   Patient: Ricardo Cooper           Date of Birth: Mar 12, 1946           MRN: [id number] Visit Date: 05/19/2022 Requested by: Ginger Organ., MD [address] [place],  [place] [address] PCP: Ginger Organ., MD  Subjective: Chief Complaint  Patient presents with   Left Shoulder - Pain    HPI: Ricardo Cooper is a 77 y.o. male who returns to the office for follow-up visit.    Plan at last visit was: Ricardo Cooper is a 77 y.o. male who returns to the office for follow-up visit for left shoulder pain.  Plan from last visit was noted above in HPI.  They now return with significant improvement but short-lived relief from ultrasound-guided glenohumeral injection that was administered at the last office visit.  Unfortunately only lasted a couple weeks and he has noted worsening aching pain that wakes him up from sleep over the last 5 to 6 months.  New radiographs were taken today demonstrating fairly well-preserved joint space between the humeral head and glenoid but there is a small inferior humeral head osteophyte which, along with his significant limited range of motion, may signify occult arthritis of the left shoulder.  After discussion of options today including repeat injection versus planning for surgery versus physical therapy versus living with his symptoms, patient would like to try another ultrasound-guided glenohumeral injection.  If this only last 2 to 3 weeks like it did last time then he will call the office at that point and we will order MRI arthrogram of the left shoulder.  Patient and wife agreed with plan.   Since then, patient notes he had several months of sustained relief from glenohumeral injection that was done on 02/12/2022.  He states that in the last week he has noticed the injection is wearing off and he notes increased stiffness and pain with moving the arm.  Had a lot more relief with this last injection than the first  injection he had which only lasted 2 weeks.  He would like to repeat injection.  He also complains of low back pain that he localizes to the midline low back and a little bit more on the left side as well.  He has a history of a burst fracture several decades ago that was treated surgically.  States that he did a lot of lifting and manual labor about 3 weeks ago as well as bending over to clip his toenails.  Following day he had significantly increased low back pain without any radicular pain or weakness in the his legs.  No red flag symptoms such as saddle anesthesia or incontinence.  He saw his primary doctor who prescribed steroid Dosepak which has helped his symptoms but they do persist to some degree.  He states that today is the best he has felt in 3 weeks.              ROS: All systems reviewed are negative as they relate to the chief complaint within the history of present illness.  Patient denies fevers or chills.  Assessment & Plan: Visit Diagnoses:  1. Low back pain, unspecified back pain laterality, unspecified chronicity, unspecified whether sciatica present     Plan: Ricardo Cooper is a 77 y.o. male who returns to the office for follow-up visit for left shoulder and low back pain.  Plan from last visit was noted  above in HPI.  They now return with significant improvement in the left shoulder pain from ultrasound-guided glenohumeral injection that he had at his last visit.  He would like to repeat this injection today.  This was done and patient tolerated procedure well.  Injection was seen entering the glenohumeral joint.  No complications.  Will see how this does and hopefully we get another several months from this injection and he will keep [place] posted on when this wears off.  Regarding his low back, he has anterior bridging osteophytes with fairly pronounced facet arthritis at L4-L5 and L5-S1 in particular.  He also has moderate arthritis of both SI joints, little worse than the left.   Seems most of his pain is midline and related to the facet arthritis.  Today is the best he has felt in several weeks and we discussed options such as MRI scan with plan for facet joint injections with Dr. Sula Rumple versus physical therapy versus doing nothing.  He would like to just continue doing what he is doing and he will call our office if he would like to try something else.  Follow-up as needed.  Follow-Up Instructions: No follow-ups on file.   Orders:  Orders Placed This Encounter  Procedures   XR Lumbar Spine 2-3 Views   US Guided Needle Placement - No Linked Charges   No orders of the defined types were placed in this encounter.     Procedures: Large Joint Inj: L glenohumeral on 05/19/2022 11:13 AM Indications: diagnostic evaluation and pain Details: 22 G 1.5 in and 3.5 in needle, ultrasound-guided posterior approach  Arthrogram: No  Medications: 9 mL bupivacaine 0.5 %; 40 mg methylPREDNISolone acetate 40 MG/ML; 5 mL lidocaine 1 % Outcome: tolerated well, no immediate complications Procedure, treatment alternatives, risks and benefits explained, specific risks discussed. Consent was given by the patient. Immediately prior to procedure a time out was called to verify the correct patient, procedure, equipment, support staff and site/side marked as required. Patient was prepped and draped in the usual sterile fashion.       Clinical Data: No additional findings.  Objective: Vital Signs: There were no vitals taken for this visit.  Physical Exam:  Constitutional: Patient appears well-developed HEENT:  Head: Normocephalic Eyes:EOM are normal Neck: Normal range of motion Cardiovascular: Normal rate Pulmonary/chest: Effort normal Neurologic: Patient is alert Skin: Skin is warm Psychiatric: Patient has normal mood and affect  Ortho Exam: Ortho exam demonstrates left shoulder with 15 degrees external rotation, 80 degrees abduction, 110 degrees forward flexion which is less  than the contralateral shoulder.  He has axillary nerve intact with deltoid firing.  Good supra, infra, subscapularis strength.  Mild tenderness over the bicipital groove.  No tenderness over the [facility] joint.  Has mild discomfort with passive motion of the left shoulder.  Active range of motion equivalent to passive range of motion.  Back exam demonstrates tenderness throughout the axial lumbar spine that is worst around the level of L4-L5.  He also has a little bit of tenderness over the left-sided SI joint but no tenderness over the right SI joint.  No pain with hip range of motion.  He has 5/5 hip flexion, quadricep, hamstring, dorsiflexion, plantarflexion bilaterally.  No evidence of clonus.    Specialty Comments:  No specialty comments available.  Imaging: No results found.   PMFS History: Patient Active Problem List   Diagnosis Date Noted   History of colonic polyps 04/28/2022   Obesity (BMI 30-39.9) 07/02/2021  CAP (community acquired pneumonia) 03/18/2021   Plantar fasciitis 01/18/2020   Pain due to onychomycosis of toenails of both feet 09/18/2019   Multiple lung nodules 06/07/2018   Influenza with respiratory manifestation 06/06/2015   Ulcerative colitis with complication ([place]) 63/78/5885   PURE HYPERCHOLESTEROLEMIA 03/23/2010   PRECORDIAL PAIN 03/23/2010   CHEST TIGHTNESS-PRESSURE-OTHER 03/18/2010   DYSPNEA 11/19/2008   HYPERLIPIDEMIA 02/01/2008   HYPERTENSION 02/01/2008   ATRIAL FIBRILLATION 02/01/2008   Allergic rhinitis 02/01/2008   COPD GOLD II 02/01/2008   Obstructive sleep apnea 02/01/2008   Past Medical History:  Diagnosis Date   Allergic rhinitis    Allergy    Cataract    beginnings of cataracts    CHF (congestive heart failure) (HCC)    COPD (chronic obstructive pulmonary disease) (HCC)    Diverticulosis    GERD (gastroesophageal reflux disease)    no issues with CPAP-1999   HTN (hypertension)    Hyperlipidemia    Obesity    Obstructive sleep apnea on  CPAP    Paroxysmal atrial fibrillation ([place]) 2005   past trauma and infection   Skin cancer    basel cell on nose,arm,leg   Sleep apnea    with CPAP   Ulcerative colitis    Ulcerative colitis, left sided ([place])     Family History  Problem Relation Age of Onset   Emphysema Father    Heart disease Father    Allergies Mother    Asthma Sister    Emphysema Maternal Grandfather    Heart disease Maternal Grandfather    Stroke Maternal Grandfather    Colon cancer Neg Hx    Esophageal cancer Neg Hx    Stomach cancer Neg Hx    Rectal cancer Neg Hx     Past Surgical History:  Procedure Laterality Date   BACK SURGERY  2005   BELPHAROPTOSIS REPAIR     bilateral   COLONOSCOPY  2013   Dr. Hilarie Fredrickson    FETAL SURGERY FOR CONGENITAL HERNIA  2001   infection in chest that had to be cleaned out     Staph Infection   POLYPECTOMY     SKIN CANCER EXCISION     TONSILLECTOMY     tubular adenoma  0277   UMBILICAL HERNIA REPAIR     VASECTOMY     Social History   Occupational History   Occupation: Physiological scientist    Comment: [employer]  Tobacco Use   Smoking status: Former    Packs/day: 1.00    Years: 35.00    Total pack years: 35.00    Types: Cigarettes    Quit date: 04/26/2001    Years since quitting: 21.0   Smokeless tobacco: Former    Types: Chew    Quit date: 06/25/1991   Tobacco comments:    quit in 2005  Substance and Sexual Activity   Alcohol use: Yes    Alcohol/week: 3.0 - 4.0 standard drinks of alcohol    Types: 3 - 4 Cans of beer per week   Drug use: No   Sexual activity: Not on file

## 2022-06-02 ENCOUNTER — Encounter: Payer: Medicare HMO | Admitting: Internal Medicine

## 2022-06-03 ENCOUNTER — Other Ambulatory Visit (HOSPITAL_COMMUNITY): Payer: Self-pay | Admitting: Internal Medicine

## 2022-06-18 ENCOUNTER — Encounter: Payer: Self-pay | Admitting: Internal Medicine

## 2022-06-18 ENCOUNTER — Ambulatory Visit (AMBULATORY_SURGERY_CENTER): Payer: Medicare HMO | Admitting: Internal Medicine

## 2022-06-18 VITALS — BP 130/63 | HR 79 | Temp 97.3°F | Resp 13 | Ht 71.0 in | Wt 245.0 lb

## 2022-06-18 DIAGNOSIS — E669 Obesity, unspecified: Secondary | ICD-10-CM | POA: Diagnosis not present

## 2022-06-18 DIAGNOSIS — Z09 Encounter for follow-up examination after completed treatment for conditions other than malignant neoplasm: Secondary | ICD-10-CM | POA: Diagnosis not present

## 2022-06-18 DIAGNOSIS — D120 Benign neoplasm of cecum: Secondary | ICD-10-CM

## 2022-06-18 DIAGNOSIS — D125 Benign neoplasm of sigmoid colon: Secondary | ICD-10-CM

## 2022-06-18 DIAGNOSIS — K635 Polyp of colon: Secondary | ICD-10-CM | POA: Diagnosis not present

## 2022-06-18 DIAGNOSIS — K5140 Inflammatory polyps of colon without complications: Secondary | ICD-10-CM

## 2022-06-18 DIAGNOSIS — K51919 Ulcerative colitis, unspecified with unspecified complications: Secondary | ICD-10-CM

## 2022-06-18 DIAGNOSIS — I509 Heart failure, unspecified: Secondary | ICD-10-CM | POA: Diagnosis not present

## 2022-06-18 DIAGNOSIS — J449 Chronic obstructive pulmonary disease, unspecified: Secondary | ICD-10-CM | POA: Diagnosis not present

## 2022-06-18 DIAGNOSIS — G4733 Obstructive sleep apnea (adult) (pediatric): Secondary | ICD-10-CM | POA: Diagnosis not present

## 2022-06-18 MED ORDER — SODIUM CHLORIDE 0.9 % IV SOLN: 500.0000 mL | Freq: Once | INTRAVENOUS | Status: DC

## 2022-06-18 NOTE — Op Note (Signed)
[facility] Patient Name: Ricardo Cooper Procedure Date: 06/18/2022 1:24 PM MRN: [id number] Endoscopist: Jerene Bears , MD, [id number] Age: 77 Referring MD:  Date of Birth: 11-Dec-1945 Gender: Male Account #: [id number] Procedure:                Colonoscopy Indications:              High risk colon cancer surveillance: Ulcerative                            left sided colitis of 8 (or more) years duration                            and hx of adenomatous colon polyps, Last                            colonoscopy: January 2020; currently symptoms in                            remission on 6-mp and sulfasalazine Medicines:                Monitored Anesthesia Care Procedure:                Pre-Anesthesia Assessment:                           - Prior to the procedure, a History and Physical                            was performed, and patient medications and                            allergies were reviewed. The patient's tolerance of                            previous anesthesia was also reviewed. The risks                            and benefits of the procedure and the sedation                            options and risks were discussed with the patient.                            All questions were answered, and informed consent                            was obtained. Prior Anticoagulants: The patient has                            taken no anticoagulant or antiplatelet agents. ASA                            Grade Assessment: III - A patient with severe  systemic disease. After reviewing the risks and                            benefits, the patient was deemed in satisfactory                            condition to undergo the procedure.                           After obtaining informed consent, the colonoscope                            was passed under direct vision. Throughout the                            procedure, the patient's blood  pressure, pulse, and                            oxygen saturations were monitored continuously. The                            Olympus CF-HQ190L [id number]) Colonoscope was                            introduced through the anus and advanced to the                            cecum, identified by appendiceal orifice and                            ileocecal valve. The colonoscopy was performed                            without difficulty. The patient tolerated the                            procedure well. The quality of the bowel                            preparation was good. The ileocecal valve,                            appendiceal orifice, and rectum were photographed. Scope In: 1:49:51 PM Scope Out: 2:13:31 PM Scope Withdrawal Time: 0 hours 14 minutes 58 seconds  Total Procedure Duration: 0 hours 23 minutes 40 seconds  Findings:                 The digital rectal exam was normal.                           A 7 mm polyp was found in the cecum. The polyp was                            sessile. The polyp was removed with a cold snare.  Resection and retrieval were complete.                           A 4 mm polyp was found in the sigmoid colon. The                            polyp was sessile. The polyp was removed with a                            cold snare. Resection and retrieval were complete.                           Multiple large-mouthed, medium-mouthed and                            small-mouthed diverticula were found in the sigmoid                            colon, descending colon and splenic flexure.                           Internal hemorrhoids were found during                            retroflexion. The hemorrhoids were small. Complications:            No immediate complications. Estimated Blood Loss:     Estimated blood loss was minimal. Impression:               - One 7 mm polyp in the cecum, removed with a cold                             snare. Resected and retrieved.                           - One 4 mm polyp in the sigmoid colon, removed with                            a cold snare. Resected and retrieved.                           - Moderate diverticulosis in the sigmoid colon, in                            the descending colon and at the splenic flexure.                           - Normal colonic mucosa; no evidence of colitis.                            Colitis in remission.                           - Internal hemorrhoids. Recommendation:           -  Patient has a contact number available for                            emergencies. The signs and symptoms of potential                            delayed complications were discussed with the                            patient. Return to normal activities tomorrow.                            Written discharge instructions were provided to the                            patient.                           - Resume previous diet.                           - Continue present medications.                           - Await pathology results.                           - Repeat colonoscopy is recommended for                            surveillance. The colonoscopy date will be                            determined after pathology results from today's                            exam become available for review. Jerene Bears, MD 06/18/2022 2:19:10 PM This report has been signed electronically.

## 2022-06-18 NOTE — Patient Instructions (Addendum)
  Thank you for letting us take care of your healthcare needs today. Please see handouts given to you on Polyps, Diverticulosis and Hemorrhoids.   YOU HAD AN ENDOSCOPIC PROCEDURE TODAY AT [facility]:   Refer to the procedure report that was given to you for any specific questions about what was found during the examination.  If the procedure report does not answer your questions, please call your gastroenterologist to clarify.  If you requested that your care partner not be given the details of your procedure findings, then the procedure report has been included in a sealed envelope for you to review at your convenience later.  YOU SHOULD EXPECT: Some feelings of bloating in the abdomen. Passage of more gas than usual.  Walking can help get rid of the air that was put into your GI tract during the procedure and reduce the bloating. If you had a lower endoscopy (such as a colonoscopy or flexible sigmoidoscopy) you may notice spotting of blood in your stool or on the toilet paper. If you underwent a bowel prep for your procedure, you may not have a normal bowel movement for a few days.  Please Note:  You might notice some irritation and congestion in your nose or some drainage.  This is from the oxygen used during your procedure.  There is no need for concern and it should clear up in a day or so.  SYMPTOMS TO REPORT IMMEDIATELY:  Following lower endoscopy (colonoscopy or flexible sigmoidoscopy):  Excessive amounts of blood in the stool  Significant tenderness or worsening of abdominal pains  Swelling of the abdomen that is new, acute  Fever of 100F or higher   For urgent or emergent issues, a gastroenterologist can be reached at any hour by calling [phone]. Do not use MyChart messaging for urgent concerns.    DIET:  We do recommend a small meal at first, but then you may proceed to your regular diet.  Drink plenty of fluids but you should avoid alcoholic beverages for  24 hours.  ACTIVITY:  You should plan to take it easy for the rest of today and you should NOT DRIVE or use heavy machinery until tomorrow (because of the sedation medicines used during the test).    FOLLOW UP: Our staff will call the number listed on your records the next business day following your procedure.  We will call around 7:15- 8:00 am to check on you and address any questions or concerns that you may have regarding the information given to you following your procedure. If we do not reach you, we will leave a message.     If any biopsies were taken you will be contacted by phone or by letter within the next 1-3 weeks.  Please call us at [phone] if you have not heard about the biopsies in 3 weeks.    SIGNATURES/CONFIDENTIALITY: You and/or your care partner have signed paperwork which will be entered into your electronic medical record.  These signatures attest to the fact that that the information above on your After Visit Summary has been reviewed and is understood.  Full responsibility of the confidentiality of this discharge information lies with you and/or your care-partner.

## 2022-06-18 NOTE — Progress Notes (Signed)
GASTROENTEROLOGY PROCEDURE H&P NOTE   Primary Care Physician: Ginger Organ., MD    Reason for Procedure:  History of ulcerative proctosigmoiditis and tubular adenomas  Plan:    Surveillance colonoscopy  Patient is appropriate for endoscopic procedure(s) in the ambulatory ([facility]) setting.  The nature of the procedure, as well as the risks, benefits, and alternatives were carefully and thoroughly reviewed with the patient. Ample time for discussion and questions allowed. The patient understood, was satisfied, and agreed to proceed.     HPI: Ricardo Cooper is a 77 y.o. male who presents for surveillance colonoscopy.  Medical history as below.  Tolerated the prep.  No recent chest pain or shortness of breath.  No abdominal pain today.  Past Medical History:  Diagnosis Date   Allergic rhinitis    Allergy    Cataract    beginnings of cataracts    CHF (congestive heart failure) (HCC)    COPD (chronic obstructive pulmonary disease) (HCC)    Diverticulosis    GERD (gastroesophageal reflux disease)    no issues with CPAP-1999   HTN (hypertension)    Hyperlipidemia    Obesity    Obstructive sleep apnea on CPAP    Paroxysmal atrial fibrillation ([place]) 2005   past trauma and infection   Skin cancer    basel cell on nose,arm,leg   Sleep apnea    with CPAP   Ulcerative colitis    Ulcerative colitis, left sided [facility])     Past Surgical History:  Procedure Laterality Date   BACK SURGERY  2005   BELPHAROPTOSIS REPAIR     bilateral   COLONOSCOPY  2013   Dr. Hilarie Fredrickson    FETAL SURGERY FOR CONGENITAL HERNIA  2001   infection in chest that had to be cleaned out     Staph Infection   POLYPECTOMY     SKIN CANCER EXCISION     TONSILLECTOMY     tubular adenoma  0000000   UMBILICAL HERNIA REPAIR     VASECTOMY      Prior to Admission medications   Medication Sig Start Date End Date Taking? Authorizing Provider  acetaminophen (TYLENOL) 500 MG tablet Take 500 mg by mouth every  6 (six) hours as needed for mild pain.   Yes [provider]  diltiazem (CARDIZEM CD) 180 MG 24 hr capsule TAKE ONE CAPSULE BY MOUTH ONCE DAILY 06/03/22  Yes Bensimhon, Shaune Pascal, MD  ezetimibe (ZETIA) 10 MG tablet Take 10 mg by mouth daily.   Yes [provider]  fish oil-omega-3 fatty acids 1000 MG capsule Take 2 g by mouth daily.   Yes [provider]  folic acid (FOLVITE) 1 MG tablet Take 1 tablet (1 mg total) by mouth every morning. 04/28/22  Yes Zehr, Laban Emperor, PA-C  losartan-hydrochlorothiazide (HYZAAR) 50-12.5 MG tablet TAKE ONE TABLET BY MOUTH ONCE DAILY 09/16/21  Yes Bensimhon, Shaune Pascal, MD  mercaptopurine (PURINETHOL) 50 MG tablet Take 2 tablets (100 mg total) by mouth daily. TAKE TWO TABLETS BY MOUTH BEFORE BREAKFAST 04/28/22  Yes Zehr, Laban Emperor, PA-C  Multiple Vitamins-Minerals (PRESERVISION AREDS 2) CAPS take 1 po BID Oral 12/28/17  Yes [provider]  pravastatin (PRAVACHOL) 20 MG tablet Take 20 mg by mouth at bedtime. 05/16/15  Yes [provider]  sulfaSALAzine (AZULFIDINE) 500 MG tablet Take 2 tablets (1,000 mg total) by mouth 3 (three) times daily. TAKE TWO TABLETS BY MOUTH THREE TIMES DAILY 04/28/22  Yes Zehr, Laban Emperor, PA-C  tadalafil (CIALIS) 5 MG tablet Take 5 mg by mouth at bedtime. 12/24/20  Yes [provider]  TRELEGY ELLIPTA 100-62.5-25 MCG/INH AEPB INHALE 1 PUFF BY MOUTH EVERY DAY 05/19/20  Yes Byrum, Rose Fillers, MD  vitamin B-12 (CYANOCOBALAMIN) 250 MCG tablet Take 1,000 mcg by mouth daily.   Yes [provider]  vitamin C (ASCORBIC ACID) 250 MG tablet Take 500 mg by mouth daily.   Yes [provider]  Vitamin D, Cholecalciferol, 25 MCG (1000 UT) CAPS Take 1 capsule by mouth daily. 05/05/10  Yes [provider]  albuterol (VENTOLIN HFA) 108 (90 Base) MCG/ACT inhaler Inhale 2 puffs into the lungs every 6 (six) hours as needed.    [provider]  cyclobenzaprine (FLEXERIL) 10 MG tablet Take 10 mg  by mouth 3 (three) times daily as needed. 05/10/22   [provider]  LAGEVRIO 200 MG CAPS capsule SMARTSIG:4 Capsule(s) By Mouth Every 12 Hours Patient not taking: Reported on 06/18/2022 03/12/22   [provider]  tobramycin (TOBREX) 0.3 % ophthalmic solution Place 1 drop into both eyes in the morning, at noon, in the evening, and at bedtime. Once a month 01/19/21   [provider]  CORTIFOAM 90 MG rectal foam  06/20/11 06/07/17  [provider]  diltiazem (DILACOR XR) 180 MG 24 hr capsule Take 1 capsule (180 mg total) by mouth daily. 07/13/12 06/07/17  Bensimhon, Shaune Pascal, MD    Current Outpatient Medications  Medication Sig Dispense Refill   acetaminophen (TYLENOL) 500 MG tablet Take 500 mg by mouth every 6 (six) hours as needed for mild pain.     diltiazem (CARDIZEM CD) 180 MG 24 hr capsule TAKE ONE CAPSULE BY MOUTH ONCE DAILY 90 capsule 2   ezetimibe (ZETIA) 10 MG tablet Take 10 mg by mouth daily.     fish oil-omega-3 fatty acids 1000 MG capsule Take 2 g by mouth daily.     folic acid (FOLVITE) 1 MG tablet Take 1 tablet (1 mg total) by mouth every morning. 90 tablet 3   losartan-hydrochlorothiazide (HYZAAR) 50-12.5 MG tablet TAKE ONE TABLET BY MOUTH ONCE DAILY 90 tablet 3   mercaptopurine (PURINETHOL) 50 MG tablet Take 2 tablets (100 mg total) by mouth daily. TAKE TWO TABLETS BY MOUTH BEFORE BREAKFAST 180 tablet 3   Multiple Vitamins-Minerals (PRESERVISION AREDS 2) CAPS take 1 po BID Oral     pravastatin (PRAVACHOL) 20 MG tablet Take 20 mg by mouth at bedtime.  4   sulfaSALAzine (AZULFIDINE) 500 MG tablet Take 2 tablets (1,000 mg total) by mouth 3 (three) times daily. TAKE TWO TABLETS BY MOUTH THREE TIMES DAILY 540 tablet 3   tadalafil (CIALIS) 5 MG tablet Take 5 mg by mouth at bedtime.     TRELEGY ELLIPTA 100-62.5-25 MCG/INH AEPB INHALE 1 PUFF BY MOUTH EVERY DAY 60 each 12   vitamin B-12 (CYANOCOBALAMIN) 250 MCG tablet Take 1,000 mcg by mouth daily.      vitamin C (ASCORBIC ACID) 250 MG tablet Take 500 mg by mouth daily.     Vitamin D, Cholecalciferol, 25 MCG (1000 UT) CAPS Take 1 capsule by mouth daily.     albuterol (VENTOLIN HFA) 108 (90 Base) MCG/ACT inhaler Inhale 2 puffs into the lungs every 6 (six) hours as needed.     cyclobenzaprine (FLEXERIL) 10 MG tablet Take 10 mg by mouth 3 (three) times daily as needed.     LAGEVRIO 200 MG CAPS capsule SMARTSIG:4 Capsule(s) By Mouth Every 12 Hours (Patient  not taking: Reported on 06/18/2022)     tobramycin (TOBREX) 0.3 % ophthalmic solution Place 1 drop into both eyes in the morning, at noon, in the evening, and at bedtime. Once a month     Current Facility-Administered Medications  Medication Dose Route Frequency Provider Last Rate Last Admin   0.9 %  sodium chloride infusion  500 mL Intravenous Once Denetria Luevanos, Lajuan Lines, MD        Allergies as of 06/18/2022 - Review Complete 06/18/2022  Allergen Reaction Noted   Ibuprofen Other (See Comments)    Ozempic (0.25 or 0.5 mg-dose) [semaglutide(0.25 or 0.'5mg'$ -dos)]  09/10/2021    Family History  Problem Relation Age of Onset   Emphysema Father    Heart disease Father    Allergies Mother    Asthma Sister    Emphysema Maternal Grandfather    Heart disease Maternal Grandfather    Stroke Maternal Grandfather    Colon cancer Neg Hx    Esophageal cancer Neg Hx    Stomach cancer Neg Hx    Rectal cancer Neg Hx     Social History   Socioeconomic History   Marital status: Married    Spouse name: Not on file   Number of children: 2   Years of education: Not on file   Highest education level: Not on file  Occupational History   Occupation: Physiological scientist    Comment: [employer]  Tobacco Use   Smoking status: Former    Packs/day: 1.00    Years: 35.00    Total pack years: 35.00    Types: Cigarettes    Quit date: 04/26/2001    Years since quitting: 21.1   Smokeless tobacco: Former    Types: Chew    Quit date: 06/25/1991   Tobacco comments:     quit in 2005  Substance and Sexual Activity   Alcohol use: Yes    Alcohol/week: 3.0 - 4.0 standard drinks of alcohol    Types: 3 - 4 Cans of beer per week   Drug use: No   Sexual activity: Not on file  Other Topics Concern   Not on file  Social History Narrative   Not on file   Social Determinants of Health   Financial Resource Strain: Not on file  Food Insecurity: Not on file  Transportation Needs: Not on file  Physical Activity: Not on file  Stress: Not on file  Social Connections: Not on file  Intimate Partner Violence: Not on file    Physical Exam: Vital signs in last 24 hours: '@BP'$  133/67   Pulse 86   Temp (!) 97.3 F (36.3 C)   Ht '5\' 11"'$  (1.803 m)   Wt 245 lb (111.1 kg)   SpO2 95%   BMI 34.17 kg/m  GEN: NAD EYE: Sclerae anicteric ENT: MMM CV: Non-tachycardic Pulm: CTA b/l GI: Soft, NT/ND NEURO:  Alert & Oriented x 3   Zenovia Jarred, MD [facility]  06/18/2022 1:32 PM

## 2022-06-18 NOTE — Progress Notes (Signed)
Sedate, gd SR, tolerated procedure well, VSS, report to RN 

## 2022-06-18 NOTE — Progress Notes (Signed)
Called to room to assist during endoscopic procedure.  Patient ID and intended procedure confirmed with present staff. Received instructions for my participation in the procedure from the performing physician.  

## 2022-06-21 ENCOUNTER — Telehealth: Payer: Self-pay

## 2022-06-21 NOTE — Telephone Encounter (Signed)
Attempted f/u call. No answer, left VM. 

## 2022-06-23 DIAGNOSIS — H43823 Vitreomacular adhesion, bilateral: Secondary | ICD-10-CM | POA: Diagnosis not present

## 2022-06-23 DIAGNOSIS — H353211 Exudative age-related macular degeneration, right eye, with active choroidal neovascularization: Secondary | ICD-10-CM | POA: Diagnosis not present

## 2022-06-23 DIAGNOSIS — H2513 Age-related nuclear cataract, bilateral: Secondary | ICD-10-CM | POA: Diagnosis not present

## 2022-06-23 DIAGNOSIS — H353121 Nonexudative age-related macular degeneration, left eye, early dry stage: Secondary | ICD-10-CM | POA: Diagnosis not present

## 2022-06-23 DIAGNOSIS — H43812 Vitreous degeneration, left eye: Secondary | ICD-10-CM | POA: Diagnosis not present

## 2022-06-24 ENCOUNTER — Encounter: Payer: Self-pay | Admitting: Internal Medicine

## 2022-07-07 DIAGNOSIS — G4733 Obstructive sleep apnea (adult) (pediatric): Secondary | ICD-10-CM | POA: Diagnosis not present

## 2022-07-22 DIAGNOSIS — G4733 Obstructive sleep apnea (adult) (pediatric): Secondary | ICD-10-CM | POA: Diagnosis not present

## 2022-08-10 DIAGNOSIS — Z85828 Personal history of other malignant neoplasm of skin: Secondary | ICD-10-CM | POA: Diagnosis not present

## 2022-08-10 DIAGNOSIS — D1801 Hemangioma of skin and subcutaneous tissue: Secondary | ICD-10-CM | POA: Diagnosis not present

## 2022-08-10 DIAGNOSIS — D485 Neoplasm of uncertain behavior of skin: Secondary | ICD-10-CM | POA: Diagnosis not present

## 2022-08-10 DIAGNOSIS — C44629 Squamous cell carcinoma of skin of left upper limb, including shoulder: Secondary | ICD-10-CM | POA: Diagnosis not present

## 2022-08-10 DIAGNOSIS — L570 Actinic keratosis: Secondary | ICD-10-CM | POA: Diagnosis not present

## 2022-08-10 DIAGNOSIS — L820 Inflamed seborrheic keratosis: Secondary | ICD-10-CM | POA: Diagnosis not present

## 2022-08-10 DIAGNOSIS — L821 Other seborrheic keratosis: Secondary | ICD-10-CM | POA: Diagnosis not present

## 2022-08-18 DIAGNOSIS — H43823 Vitreomacular adhesion, bilateral: Secondary | ICD-10-CM | POA: Diagnosis not present

## 2022-08-18 DIAGNOSIS — H353211 Exudative age-related macular degeneration, right eye, with active choroidal neovascularization: Secondary | ICD-10-CM | POA: Diagnosis not present

## 2022-08-18 DIAGNOSIS — H353121 Nonexudative age-related macular degeneration, left eye, early dry stage: Secondary | ICD-10-CM | POA: Diagnosis not present

## 2022-08-18 DIAGNOSIS — H43812 Vitreous degeneration, left eye: Secondary | ICD-10-CM | POA: Diagnosis not present

## 2022-08-18 DIAGNOSIS — H2513 Age-related nuclear cataract, bilateral: Secondary | ICD-10-CM | POA: Diagnosis not present

## 2022-09-01 ENCOUNTER — Other Ambulatory Visit (HOSPITAL_COMMUNITY): Payer: Self-pay | Admitting: Internal Medicine

## 2022-10-06 DIAGNOSIS — H353211 Exudative age-related macular degeneration, right eye, with active choroidal neovascularization: Secondary | ICD-10-CM | POA: Diagnosis not present

## 2022-10-06 DIAGNOSIS — H43823 Vitreomacular adhesion, bilateral: Secondary | ICD-10-CM | POA: Diagnosis not present

## 2022-10-06 DIAGNOSIS — H2513 Age-related nuclear cataract, bilateral: Secondary | ICD-10-CM | POA: Diagnosis not present

## 2022-10-06 DIAGNOSIS — H353121 Nonexudative age-related macular degeneration, left eye, early dry stage: Secondary | ICD-10-CM | POA: Diagnosis not present

## 2022-10-06 DIAGNOSIS — H43812 Vitreous degeneration, left eye: Secondary | ICD-10-CM | POA: Diagnosis not present

## 2022-10-18 ENCOUNTER — Encounter: Payer: Self-pay | Admitting: Orthopedic Surgery

## 2022-10-18 ENCOUNTER — Ambulatory Visit: Payer: Medicare HMO | Admitting: Orthopedic Surgery

## 2022-10-18 DIAGNOSIS — M19012 Primary osteoarthritis, left shoulder: Secondary | ICD-10-CM | POA: Diagnosis not present

## 2022-10-18 DIAGNOSIS — M25512 Pain in left shoulder: Secondary | ICD-10-CM

## 2022-10-18 NOTE — Progress Notes (Signed)
Office Visit Note   Patient: Ricardo Cooper           Date of Birth: Jan 04, 1946           MRN: [id number] Visit Date: 10/18/2022 Requested by: Cleatis Polka., MD [address] [place],  [place] [address] PCP: Cleatis Polka., MD  Subjective: Chief Complaint  Patient presents with   Left Shoulder - Pain    HPI: Ricardo Cooper is a 77 y.o. male who presents to the office reporting left shoulder pain for several years.  Describes relatively constant pain.  Has had prior injections which only give him several days of relief.  The pain does wake him from sleep at night.  He is right-hand dominant.  Does report painful range of motion but no neck pain radicular pain or numbness and tingling.  Hurts him to drive.  He is able to sleep but primarily on his back.  Denies any history of injury which started this cycle of pain in the left shoulder.  Hard for him to put his belt through his loop in the back.  Anything over shoulder level gives him significant symptoms.  He does have a history of ulcerative colitis and he cannot take anti-inflammatories.  Prior radiographs are reviewed and show maintenance of acromiohumeral distance along with some scalloping of the inferior scapular neck and tiny inferior humeral neck osteophyte.  Not too much joint space narrowing on plain radiographs.              ROS: All systems reviewed are negative as they relate to the chief complaint within the history of present illness.  Patient denies fevers or chills.  Assessment & Plan: Visit Diagnoses:  1. Left shoulder pain, unspecified chronicity   2. Primary osteoarthritis, left shoulder     Plan: Impression is restricted range of motion on that left shoulder consistent with either arthritis or early frozen shoulder or some combination of both.  Symptoms ongoing for 2 years.  Need MRI arthrogram to evaluate for frozen shoulder versus occult arthritis.  Follow-up after that study.   Follow-Up  Instructions: No follow-ups on file.   Orders:  Orders Placed This Encounter  Procedures   MR Shoulder Left w/ contrast   Arthrogram   No orders of the defined types were placed in this encounter.     Procedures: No procedures performed   Clinical Data: No additional findings.  Objective: Vital Signs: There were no vitals taken for this visit.  Physical Exam:  Constitutional: Patient appears well-developed HEENT:  Head: Normocephalic Eyes:EOM are normal Neck: Normal range of motion Cardiovascular: Normal rate Pulmonary/chest: Effort normal Neurologic: Patient is alert Skin: Skin is warm Psychiatric: Patient has normal mood and affect  Ortho Exam: Ortho exam demonstrates range of motion on the right of 40/100/165.  On the left 20/90/150.  Rotator cuff strength is pretty reasonable to external rotation and subscap testing bilaterally.  Not too much coarse grinding or crepitus with internal/external rotation of the shoulder at 90 degrees of abduction but anything overhead is more painful on the left than the right.  No discrete AC joint tenderness is present. Specialty Comments:  No specialty comments available.  Imaging: No results found.   PMFS History: Patient Active Problem List   Diagnosis Date Noted   History of colonic polyps 04/28/2022   Obesity (BMI 30-39.9) 07/02/2021   CAP (community acquired pneumonia) 03/18/2021   Plantar fasciitis 01/18/2020   Pain due to onychomycosis of  toenails of both feet 09/18/2019   Multiple lung nodules 06/07/2018   Influenza with respiratory manifestation 06/06/2015   Ulcerative colitis with complication (HCC) 05/03/2011   PURE HYPERCHOLESTEROLEMIA 03/23/2010   PRECORDIAL PAIN 03/23/2010   CHEST TIGHTNESS-PRESSURE-OTHER 03/18/2010   DYSPNEA 11/19/2008   HYPERLIPIDEMIA 02/01/2008   HYPERTENSION 02/01/2008   ATRIAL FIBRILLATION 02/01/2008   Allergic rhinitis 02/01/2008   COPD GOLD II 02/01/2008   Obstructive sleep apnea  02/01/2008   Past Medical History:  Diagnosis Date   Allergic rhinitis    Allergy    Cataract    beginnings of cataracts    CHF (congestive heart failure) (HCC)    COPD (chronic obstructive pulmonary disease) (HCC)    Diverticulosis    GERD (gastroesophageal reflux disease)    no issues with CPAP-1999   HTN (hypertension)    Hyperlipidemia    Obesity    Obstructive sleep apnea on CPAP    Paroxysmal atrial fibrillation (HCC) 2005   past trauma and infection   Skin cancer    basel cell on nose,arm,leg   Sleep apnea    with CPAP   Ulcerative colitis    Ulcerative colitis, left sided (HCC)     Family History  Problem Relation Age of Onset   Emphysema Father    Heart disease Father    Allergies Mother    Asthma Sister    Emphysema Maternal Grandfather    Heart disease Maternal Grandfather    Stroke Maternal Grandfather    Colon cancer Neg Hx    Esophageal cancer Neg Hx    Stomach cancer Neg Hx    Rectal cancer Neg Hx     Past Surgical History:  Procedure Laterality Date   BACK SURGERY  2005   BELPHAROPTOSIS REPAIR     bilateral   COLONOSCOPY  2013   Dr. Rhea Belton    FETAL SURGERY FOR CONGENITAL HERNIA  2001   infection in chest that had to be cleaned out     Staph Infection   POLYPECTOMY     SKIN CANCER EXCISION     TONSILLECTOMY     tubular adenoma  2013   UMBILICAL HERNIA REPAIR     VASECTOMY     Social History   Occupational History   Occupation: Games developer    Comment: [employer]  Tobacco Use   Smoking status: Former    Packs/day: 1.00    Years: 35.00    Additional pack years: 0.00    Total pack years: 35.00    Types: Cigarettes    Quit date: 04/26/2001    Years since quitting: 21.4   Smokeless tobacco: Former    Types: Chew    Quit date: 06/25/1991   Tobacco comments:    quit in 2005  Substance and Sexual Activity   Alcohol use: Yes    Alcohol/week: 3.0 - 4.0 standard drinks of alcohol    Types: 3 - 4 Cans of beer per week   Drug use:  No   Sexual activity: Not on file

## 2022-11-12 ENCOUNTER — Ambulatory Visit: Payer: Medicare HMO | Admitting: Orthopedic Surgery

## 2022-11-15 ENCOUNTER — Encounter: Payer: Self-pay | Admitting: Orthopedic Surgery

## 2022-11-16 ENCOUNTER — Other Ambulatory Visit: Payer: Medicare HMO

## 2022-11-16 ENCOUNTER — Inpatient Hospital Stay: Admission: RE | Admit: 2022-11-16 | Payer: Medicare HMO | Source: Ambulatory Visit

## 2022-11-18 DIAGNOSIS — L821 Other seborrheic keratosis: Secondary | ICD-10-CM | POA: Diagnosis not present

## 2022-11-18 DIAGNOSIS — C44629 Squamous cell carcinoma of skin of left upper limb, including shoulder: Secondary | ICD-10-CM | POA: Diagnosis not present

## 2022-11-18 DIAGNOSIS — L570 Actinic keratosis: Secondary | ICD-10-CM | POA: Diagnosis not present

## 2022-11-18 DIAGNOSIS — D485 Neoplasm of uncertain behavior of skin: Secondary | ICD-10-CM | POA: Diagnosis not present

## 2022-11-24 DIAGNOSIS — H2513 Age-related nuclear cataract, bilateral: Secondary | ICD-10-CM | POA: Diagnosis not present

## 2022-11-24 DIAGNOSIS — H353211 Exudative age-related macular degeneration, right eye, with active choroidal neovascularization: Secondary | ICD-10-CM | POA: Diagnosis not present

## 2022-11-24 DIAGNOSIS — H43823 Vitreomacular adhesion, bilateral: Secondary | ICD-10-CM | POA: Diagnosis not present

## 2022-11-24 DIAGNOSIS — H43812 Vitreous degeneration, left eye: Secondary | ICD-10-CM | POA: Diagnosis not present

## 2022-11-24 DIAGNOSIS — H353121 Nonexudative age-related macular degeneration, left eye, early dry stage: Secondary | ICD-10-CM | POA: Diagnosis not present

## 2022-11-25 ENCOUNTER — Ambulatory Visit: Payer: Medicare HMO | Admitting: Orthopedic Surgery

## 2022-11-30 ENCOUNTER — Ambulatory Visit
Admission: RE | Admit: 2022-11-30 | Discharge: 2022-11-30 | Disposition: A | Discharge status: Home / Self Care | Payer: Medicare HMO | Source: Ambulatory Visit | Attending: Orthopedic Surgery | Admitting: Orthopedic Surgery

## 2022-11-30 DIAGNOSIS — M25512 Pain in left shoulder: Secondary | ICD-10-CM

## 2022-11-30 DIAGNOSIS — M19012 Primary osteoarthritis, left shoulder: Secondary | ICD-10-CM

## 2022-11-30 DIAGNOSIS — M7502 Adhesive capsulitis of left shoulder: Secondary | ICD-10-CM | POA: Diagnosis not present

## 2022-11-30 MED ORDER — IOPAMIDOL (ISOVUE-M 200) INJECTION 41%
12.0000 mL | Freq: Once | INTRAMUSCULAR | Status: AC
  Administered 2022-11-30: 12 mL via INTRA_ARTICULAR

## 2022-12-03 ENCOUNTER — Telehealth: Payer: Self-pay | Admitting: Internal Medicine

## 2022-12-03 NOTE — Telephone Encounter (Signed)
Pt wants a new CPAP & supplies since it has been about 7yrs

## 2022-12-05 ENCOUNTER — Other Ambulatory Visit (HOSPITAL_COMMUNITY): Payer: Self-pay | Admitting: Internal Medicine

## 2022-12-06 NOTE — Telephone Encounter (Signed)
Patient has not been seen since 2022. Will need OV and CPAP compliance report.  Please schedule patient, thank you!

## 2022-12-07 NOTE — Telephone Encounter (Signed)
Patient is scheduled on 01/07/2023 at 11am with Rubye Oaks, NP. Nothing further needed

## 2022-12-08 ENCOUNTER — Ambulatory Visit: Payer: Medicare HMO | Admitting: Orthopedic Surgery

## 2022-12-08 ENCOUNTER — Encounter: Payer: Self-pay | Admitting: Orthopedic Surgery

## 2022-12-08 DIAGNOSIS — M25512 Pain in left shoulder: Secondary | ICD-10-CM | POA: Diagnosis not present

## 2022-12-08 NOTE — Progress Notes (Unsigned)
Office Visit Note   Patient: Ricardo Cooper           Date of Birth: December 28, 1945           MRN: [id number] Visit Date: 12/08/2022 Requested by: Cleatis Polka., MD [address] [place],  [place] [address] PCP: Cleatis Polka., MD  Subjective: Chief Complaint  Patient presents with   Other     Review MRI scan    HPI: Ricardo Cooper is a 77 y.o. male who presents to the office reporting left shoulder pain.  Patient states that he has pain when reaching behind his back above his head.  Localizes pain primarily to the anterior aspect of the shoulder.  Describes some occasional popping.  Since he was last seen has had an MRI scan.  That scan shows mild glenohumeral joint arthritis but there is some thickening of the inferior axillary recess along with moderate to severe AC joint arthritis and no full-thickness rotator cuff tear.  He has had 2 intra-articular injections which have helped the pain..                ROS: All systems reviewed are negative as they relate to the chief complaint within the history of present illness.  Patient denies fevers or chills.  Assessment & Plan: Visit Diagnoses:  1. Left shoulder pain, unspecified chronicity     Plan: Impression is mild restricted passive range of motion.  AC joint arthritis which may or may not be symptomatic is present.  There is some significant biceps tendinopathy as well.  Patient does have some restriction of passive range of motion.  Plan at this time is we had a discussion about operative treatment versus nonoperative treatment.  He wants to continue with nonoperative treatment with formal physical therapy.  See him back in about 2 months for clinical recheck.  Follow-Up Instructions: No follow-ups on file.   Orders:  No orders of the defined types were placed in this encounter.  No orders of the defined types were placed in this encounter.     Procedures: No procedures performed   Clinical Data: No  additional findings.  Objective: Vital Signs: There were no vitals taken for this visit.  Physical Exam:  Constitutional: Patient appears well-developed HEENT:  Head: Normocephalic Eyes:EOM are normal Neck: Normal range of motion Cardiovascular: Normal rate Pulmonary/chest: Effort normal Neurologic: Patient is alert Skin: Skin is warm Psychiatric: Patient has normal mood and affect  Ortho Exam: Ortho exam demonstrates range of motion on the right of 50/95/160.  On the left 30/80/140.  Good rotator cuff strength infraspinatus supraspinatus and subscap muscle testing.  No discrete AC joint tenderness difference left side versus right side.  Does have some equivocal O'Brien's testing on the left negative on the right.  Mild bicipital groove tenderness on the left compared to the right.  Specialty Comments:  No specialty comments available.  Imaging: No results found.   PMFS History: Patient Active Problem List   Diagnosis Date Noted   History of colonic polyps 04/28/2022   Obesity (BMI 30-39.9) 07/02/2021   CAP (community acquired pneumonia) 03/18/2021   Plantar fasciitis 01/18/2020   Pain due to onychomycosis of toenails of both feet 09/18/2019   Multiple lung nodules 06/07/2018   Influenza with respiratory manifestation 06/06/2015   Ulcerative colitis with complication (HCC) 05/03/2011   PURE HYPERCHOLESTEROLEMIA 03/23/2010   PRECORDIAL PAIN 03/23/2010   CHEST TIGHTNESS-PRESSURE-OTHER 03/18/2010   DYSPNEA 11/19/2008   HYPERLIPIDEMIA 02/01/2008  HYPERTENSION 02/01/2008   ATRIAL FIBRILLATION 02/01/2008   Allergic rhinitis 02/01/2008   COPD GOLD II 02/01/2008   Obstructive sleep apnea 02/01/2008   Past Medical History:  Diagnosis Date   Allergic rhinitis    Allergy    Cataract    beginnings of cataracts    CHF (congestive heart failure) (HCC)    COPD (chronic obstructive pulmonary disease) (HCC)    Diverticulosis    GERD (gastroesophageal reflux disease)    no  issues with CPAP-1999   HTN (hypertension)    Hyperlipidemia    Obesity    Obstructive sleep apnea on CPAP    Paroxysmal atrial fibrillation (HCC) 2005   past trauma and infection   Skin cancer    basel cell on nose,arm,leg   Sleep apnea    with CPAP   Ulcerative colitis    Ulcerative colitis, left sided (HCC)     Family History  Problem Relation Age of Onset   Emphysema Father    Heart disease Father    Allergies Mother    Asthma Sister    Emphysema Maternal Grandfather    Heart disease Maternal Grandfather    Stroke Maternal Grandfather    Colon cancer Neg Hx    Esophageal cancer Neg Hx    Stomach cancer Neg Hx    Rectal cancer Neg Hx     Past Surgical History:  Procedure Laterality Date   BACK SURGERY  2005   BELPHAROPTOSIS REPAIR     bilateral   COLONOSCOPY  2013   Dr. Rhea Belton    FETAL SURGERY FOR CONGENITAL HERNIA  2001   infection in chest that had to be cleaned out     Staph Infection   POLYPECTOMY     SKIN CANCER EXCISION     TONSILLECTOMY     tubular adenoma  2013   UMBILICAL HERNIA REPAIR     VASECTOMY     Social History   Occupational History   Occupation: Games developer    Comment: Right of [employer]  Tobacco Use   Smoking status: Former    Current packs/day: 0.00    Average packs/day: 1 pack/day for 35.0 years (35.0 ttl pk-yrs)    Types: Cigarettes    Start date: 04/26/1966    Quit date: 04/26/2001    Years since quitting: 21.6   Smokeless tobacco: Former    Types: Chew    Quit date: 06/25/1991   Tobacco comments:    quit in 2005  Substance and Sexual Activity   Alcohol use: Yes    Alcohol/week: 3.0 - 4.0 standard drinks of alcohol    Types: 3 - 4 Cans of beer per week   Drug use: No   Sexual activity: Not on file

## 2022-12-14 DIAGNOSIS — M7502 Adhesive capsulitis of left shoulder: Secondary | ICD-10-CM | POA: Diagnosis not present

## 2022-12-14 DIAGNOSIS — M25512 Pain in left shoulder: Secondary | ICD-10-CM | POA: Diagnosis not present

## 2022-12-22 ENCOUNTER — Encounter (HOSPITAL_COMMUNITY): Payer: Medicare HMO

## 2022-12-29 DIAGNOSIS — M7502 Adhesive capsulitis of left shoulder: Secondary | ICD-10-CM | POA: Diagnosis not present

## 2022-12-29 DIAGNOSIS — M25512 Pain in left shoulder: Secondary | ICD-10-CM | POA: Diagnosis not present

## 2022-12-30 DIAGNOSIS — H269 Unspecified cataract: Secondary | ICD-10-CM | POA: Diagnosis not present

## 2022-12-30 DIAGNOSIS — I4891 Unspecified atrial fibrillation: Secondary | ICD-10-CM | POA: Diagnosis not present

## 2022-12-30 DIAGNOSIS — J449 Chronic obstructive pulmonary disease, unspecified: Secondary | ICD-10-CM | POA: Diagnosis not present

## 2022-12-30 DIAGNOSIS — E669 Obesity, unspecified: Secondary | ICD-10-CM | POA: Diagnosis not present

## 2022-12-30 DIAGNOSIS — I10 Essential (primary) hypertension: Secondary | ICD-10-CM | POA: Diagnosis not present

## 2022-12-30 DIAGNOSIS — E538 Deficiency of other specified B group vitamins: Secondary | ICD-10-CM | POA: Diagnosis not present

## 2022-12-30 DIAGNOSIS — G4733 Obstructive sleep apnea (adult) (pediatric): Secondary | ICD-10-CM | POA: Diagnosis not present

## 2022-12-30 DIAGNOSIS — I2510 Atherosclerotic heart disease of native coronary artery without angina pectoris: Secondary | ICD-10-CM | POA: Diagnosis not present

## 2022-12-30 DIAGNOSIS — M1990 Unspecified osteoarthritis, unspecified site: Secondary | ICD-10-CM | POA: Diagnosis not present

## 2022-12-30 DIAGNOSIS — Z008 Encounter for other general examination: Secondary | ICD-10-CM | POA: Diagnosis not present

## 2022-12-30 DIAGNOSIS — E785 Hyperlipidemia, unspecified: Secondary | ICD-10-CM | POA: Diagnosis not present

## 2022-12-30 DIAGNOSIS — H3530 Unspecified macular degeneration: Secondary | ICD-10-CM | POA: Diagnosis not present

## 2022-12-30 DIAGNOSIS — Z8249 Family history of ischemic heart disease and other diseases of the circulatory system: Secondary | ICD-10-CM | POA: Diagnosis not present

## 2022-12-31 DIAGNOSIS — M7502 Adhesive capsulitis of left shoulder: Secondary | ICD-10-CM | POA: Diagnosis not present

## 2022-12-31 DIAGNOSIS — M25512 Pain in left shoulder: Secondary | ICD-10-CM | POA: Diagnosis not present

## 2023-01-04 DIAGNOSIS — M7502 Adhesive capsulitis of left shoulder: Secondary | ICD-10-CM | POA: Diagnosis not present

## 2023-01-04 DIAGNOSIS — M25512 Pain in left shoulder: Secondary | ICD-10-CM | POA: Diagnosis not present

## 2023-01-05 ENCOUNTER — Encounter (HOSPITAL_COMMUNITY): Payer: Self-pay

## 2023-01-05 ENCOUNTER — Ambulatory Visit (HOSPITAL_COMMUNITY)
Admission: RE | Admit: 2023-01-05 | Discharge: 2023-01-05 | Disposition: A | Discharge status: Home / Self Care | Payer: Medicare HMO | Source: Ambulatory Visit | Attending: Physician Assistant | Admitting: Physician Assistant

## 2023-01-05 VITALS — BP 122/72 | HR 73 | Wt 242.0 lb

## 2023-01-05 DIAGNOSIS — I509 Heart failure, unspecified: Secondary | ICD-10-CM | POA: Diagnosis not present

## 2023-01-05 DIAGNOSIS — E669 Obesity, unspecified: Secondary | ICD-10-CM | POA: Diagnosis not present

## 2023-01-05 DIAGNOSIS — J449 Chronic obstructive pulmonary disease, unspecified: Secondary | ICD-10-CM | POA: Insufficient documentation

## 2023-01-05 DIAGNOSIS — G4733 Obstructive sleep apnea (adult) (pediatric): Secondary | ICD-10-CM | POA: Diagnosis not present

## 2023-01-05 DIAGNOSIS — E6609 Other obesity due to excess calories: Secondary | ICD-10-CM

## 2023-01-05 DIAGNOSIS — E782 Mixed hyperlipidemia: Secondary | ICD-10-CM

## 2023-01-05 DIAGNOSIS — K5190 Ulcerative colitis, unspecified, without complications: Secondary | ICD-10-CM | POA: Insufficient documentation

## 2023-01-05 DIAGNOSIS — I110 Hypertensive heart disease with heart failure: Secondary | ICD-10-CM | POA: Insufficient documentation

## 2023-01-05 DIAGNOSIS — Z79899 Other long term (current) drug therapy: Secondary | ICD-10-CM | POA: Insufficient documentation

## 2023-01-05 DIAGNOSIS — Z6833 Body mass index (BMI) 33.0-33.9, adult: Secondary | ICD-10-CM | POA: Insufficient documentation

## 2023-01-05 DIAGNOSIS — R911 Solitary pulmonary nodule: Secondary | ICD-10-CM | POA: Diagnosis not present

## 2023-01-05 DIAGNOSIS — E785 Hyperlipidemia, unspecified: Secondary | ICD-10-CM | POA: Insufficient documentation

## 2023-01-05 DIAGNOSIS — I480 Paroxysmal atrial fibrillation: Secondary | ICD-10-CM | POA: Diagnosis not present

## 2023-01-05 DIAGNOSIS — I10 Essential (primary) hypertension: Secondary | ICD-10-CM

## 2023-01-05 MED ORDER — LOSARTAN POTASSIUM-HCTZ 50-12.5 MG PO TABS: 1.0000 | ORAL_TABLET | Freq: Every day | ORAL | 3 refills | Status: DC

## 2023-01-05 MED ORDER — DILTIAZEM HCL ER COATED BEADS 180 MG PO CP24: 180.0000 mg | ORAL_CAPSULE | Freq: Every day | ORAL | 3 refills | Status: DC

## 2023-01-05 NOTE — Patient Instructions (Signed)
Thank you for coming in today  If you had labs drawn today, any labs that are abnormal the clinic will call you No news is good news  Medications: No changes   Follow up appointments:  Your physician recommends that you schedule a follow-up appointment in:  1 year With Dr. Gala Romney    Do the following things EVERYDAY: Weigh yourself in the morning before breakfast. Write it down and keep it in a log. Take your medicines as prescribed Eat low salt foods--Limit salt (sodium) to 2000 mg per day.  Stay as active as you can everyday Limit all fluids for the day to less than 2 liters   At the [facility], you and your health needs are our priority. As part of our continuing mission to provide you with exceptional heart care, we have created designated Provider Care Teams. These Care Teams include your primary Cardiologist (physician) and Advanced Practice Providers (APPs- Physician Assistants and Nurse Practitioners) who all work together to provide you with the care you need, when you need it.   You may see any of the following providers on your designated Care Team at your next follow up: Dr Arvilla Meres Dr Marca Ancona Dr. Marcos Eke, NP Robbie Lis, [place] [facility] [place], [place] Brynda Peon, NP Karle Plumber, PharmD   Please be sure to bring in all your medications bottles to every appointment.    Thank you for choosing [facility]  If you have any questions or concerns before your next appointment please send [place] a message through [place] or call our office at [phone].    TO LEAVE A MESSAGE FOR THE NURSE SELECT OPTION 2, PLEASE LEAVE A MESSAGE INCLUDING: YOUR NAME DATE OF BIRTH CALL BACK NUMBER REASON FOR CALL**this is important as we prioritize the call backs  YOU WILL RECEIVE A CALL BACK THE SAME DAY AS LONG AS YOU CALL BEFORE 4:00 PM

## 2023-01-05 NOTE — Progress Notes (Signed)
PCP:  Dr. Eric Form HF cardiologist: Dr. Gala Romney Pulmonary MD: Dr. Delton Coombes Sleep Medicine MD: Dr. Maple Hudson  ADVANCED HF CLINIC NOTE  HPI: Ricardo Cooper is a 77 year old male who is a father of Ricardo Cooper. He has a history of obstructive sleep apnea on CPAP, obesity, hypertension, COPD, ulcerative colitis and a history of traumatic accident in 2005 with a burst fracture of L1 complicated by sternal wound infection and atrial fibrillation, which has not recurred. Previous myoview normal.   Had ETT 1/12. Walked 7:30. Normal ECG. Spirometry 12/14  FEV1 2.27 (62%) FVC 3.44 (72%)  FEF25%-75% (40%) Calcium scoring CT 6/15: Calcium score 0  Follows with Dr. Maple Hudson for OSA and Dr. Delton Coombes for Pulmonary.   He is here today for annual follow-up. Stable from cardiac standpoint. Has some shortness of breath with exertion which he attributes to COPD. Symptoms have been stable. No change in functional capacity. He has a garden and has been applying fertilizer/aerating his 1 acre property. No chest pain. No orthopnea, PND or lower extremity edema. Blood pressure controlled. Uses CPAP nightly.   He was unable to tolerate ozempic d/t nausea. Lost 15 lb with the medication but gained the weight back once it was discontinued.   ROS: All systems negative except as listed in HPI, PMH and Problem List.  Past Medical History:  Diagnosis Date   Allergic rhinitis    Allergy    Cataract    beginnings of cataracts    CHF (congestive heart failure) (HCC)    COPD (chronic obstructive pulmonary disease) (HCC)    Diverticulosis    GERD (gastroesophageal reflux disease)    no issues with CPAP-1999   HTN (hypertension)    Hyperlipidemia    Obesity    Obstructive sleep apnea on CPAP    Paroxysmal atrial fibrillation (HCC) 2005   past trauma and infection   Skin cancer    basel cell on nose,arm,leg   Sleep apnea    with CPAP   Ulcerative colitis    Ulcerative colitis, left sided (HCC)     Current Outpatient  Medications  Medication Sig Dispense Refill   acetaminophen (TYLENOL) 500 MG tablet Take 500 mg by mouth every 6 (six) hours as needed for mild pain.     albuterol (VENTOLIN HFA) 108 (90 Base) MCG/ACT inhaler Inhale 2 puffs into the lungs every 6 (six) hours as needed.     cyclobenzaprine (FLEXERIL) 10 MG tablet Take 10 mg by mouth 3 (three) times daily as needed.     diltiazem (CARDIZEM CD) 180 MG 24 hr capsule TAKE ONE CAPSULE BY MOUTH ONCE DAILY 90 capsule 2   ezetimibe (ZETIA) 10 MG tablet Take 10 mg by mouth daily.     fish oil-omega-3 fatty acids 1000 MG capsule Take 2 g by mouth daily.     folic acid (FOLVITE) 1 MG tablet Take 1 tablet (1 mg total) by mouth every morning. 90 tablet 3   losartan-hydrochlorothiazide (HYZAAR) 50-12.5 MG tablet Take 1 tablet by mouth daily. 90 tablet 2   mercaptopurine (PURINETHOL) 50 MG tablet Take 2 tablets (100 mg total) by mouth daily. TAKE TWO TABLETS BY MOUTH BEFORE BREAKFAST 180 tablet 3   Multiple Vitamins-Minerals (PRESERVISION AREDS 2) CAPS take 1 po BID Oral     pravastatin (PRAVACHOL) 40 MG tablet Take 40 mg by mouth daily.     sulfaSALAzine (AZULFIDINE) 500 MG tablet Take 2 tablets (1,000 mg total) by mouth 3 (three) times daily. TAKE TWO TABLETS BY MOUTH  THREE TIMES DAILY 540 tablet 3   tadalafil (CIALIS) 5 MG tablet Take 5 mg by mouth at bedtime.     tobramycin (TOBREX) 0.3 % ophthalmic solution Place 1 drop into both eyes in the morning, at noon, in the evening, and at bedtime. Once a month     TRELEGY ELLIPTA 100-62.5-25 MCG/INH AEPB INHALE 1 PUFF BY MOUTH EVERY DAY 60 each 12   vitamin B-12 (CYANOCOBALAMIN) 250 MCG tablet Take 1,000 mcg by mouth daily.     vitamin C (ASCORBIC ACID) 250 MG tablet Take 500 mg by mouth. Every other day     Vitamin D, Cholecalciferol, 25 MCG (1000 UT) CAPS Take 1 capsule by mouth daily.     No current facility-administered medications for this encounter.   Vitals:   01/05/23 1415  BP: 122/72  Pulse: 73   SpO2: 94%  Weight: 109.8 kg (242 lb)    Wt Readings from Last 3 Encounters:  01/05/23 109.8 kg (242 lb)  06/18/22 111.1 kg (245 lb)  04/28/22 111.4 kg (245 lb 8 oz)    PHYSICAL EXAM General:  Well appearing. No resp difficulty HEENT: normal Neck: supple. no JVD. Carotids 2+ bilat; no bruits. No lymphadenopathy or thryomegaly appreciated. Cor: PMI nondisplaced. Regular rate & rhythm. No rubs, gallops or murmurs. Lungs: clear mildly decreased. Abdomen: soft, nontender, nondistended. No hepatosplenomegaly. No bruits or masses. Good bowel sounds. Extremities: no cyanosis, clubbing, rash, edema Neuro: alert & orientedx3, cranial nerves grossly intact. moves all 4 extremities w/o difficulty. Affect pleasant    ASSESSMENT & PLAN:  1. HTN - Blood pressure controlled.  - Continue current regimen - Refilled diltiazem and hyzaar  2. Hyperlipidemia  - Continue pravastatin 20 mg daily + Zetia 10 mg daily. - Unable to tolerate rosuvastatin and atorvastatin. - Lipid checked & followed by Dr. Clelia Croft.   3. Cardiac risk prevention  - calcium score 0 in 6/15. Continue RF management . - CT chest 9/21 showed small calcium deposits. Stable. - No s/s angina  4. Pulmonary nodule - followed by Dr.Byrum. Stable by CT 9/21. No further f/u imaging felt to be indicated.  5. PAF - Isolated incident in post-trauma setting. Has not recurred.   6. Obesity Body mass index is 33.75 kg/m.  - Unable to tolerate Ozempic. He is interested in trying another GLP-1RA if covered by his plan. Will refer back to PharmD clinic. - Discussed regular exercise  Follow-up: 1 year with Dr. Gala Romney   Estefani Bateson N, PA-C 2:26 PM

## 2023-01-07 ENCOUNTER — Other Ambulatory Visit: Payer: Self-pay | Admitting: Gastroenterology

## 2023-01-07 ENCOUNTER — Encounter: Payer: Self-pay | Admitting: Adult Health

## 2023-01-07 ENCOUNTER — Ambulatory Visit (INDEPENDENT_AMBULATORY_CARE_PROVIDER_SITE_OTHER): Payer: Medicare HMO

## 2023-01-07 ENCOUNTER — Ambulatory Visit: Payer: Medicare HMO | Admitting: Adult Health

## 2023-01-07 ENCOUNTER — Telehealth: Payer: Self-pay | Admitting: Pharmacist

## 2023-01-07 VITALS — BP 120/78 | HR 73 | Ht 71.5 in | Wt 243.0 lb

## 2023-01-07 DIAGNOSIS — M7502 Adhesive capsulitis of left shoulder: Secondary | ICD-10-CM | POA: Diagnosis not present

## 2023-01-07 DIAGNOSIS — J449 Chronic obstructive pulmonary disease, unspecified: Secondary | ICD-10-CM

## 2023-01-07 DIAGNOSIS — R918 Other nonspecific abnormal finding of lung field: Secondary | ICD-10-CM | POA: Diagnosis not present

## 2023-01-07 DIAGNOSIS — G4733 Obstructive sleep apnea (adult) (pediatric): Secondary | ICD-10-CM | POA: Diagnosis not present

## 2023-01-07 DIAGNOSIS — M25512 Pain in left shoulder: Secondary | ICD-10-CM | POA: Diagnosis not present

## 2023-01-07 MED ORDER — ALBUTEROL SULFATE HFA 108 (90 BASE) MCG/ACT IN AERS
1.0000 | INHALATION_SPRAY | Freq: Four times a day (QID) | RESPIRATORY_TRACT | 3 refills | Status: AC | PRN
Start: 2023-01-07 — End: ?

## 2023-01-07 MED ORDER — TRELEGY ELLIPTA 100-62.5-25 MCG/ACT IN AEPB: 1.0000 | INHALATION_SPRAY | Freq: Every day | RESPIRATORY_TRACT | 11 refills | Status: DC

## 2023-01-07 NOTE — Telephone Encounter (Signed)
Pt saw HF clinic recently, re-referred to PharmD to try another GLP. He previously took Ozempic in 2023 and stopped it secondary to nausea.  He was using Ozempic off-label for weight loss last year, does not have diagnosis of DM. Insurance no longer allowing off-label use of diabetic GLPs. He has Medicare Part D which does not cover weight loss drugs. [facility] for CAD indication not indicated as he does not have CAD and it's semaglutide so would be like him retrying Ozempic that he didn't tolerate. Called pt to advise him that insurance will not cover meds in GLP class unless he develops diagnosis of DM in the future, left him a detailed message.

## 2023-01-07 NOTE — Progress Notes (Signed)
@Patient  ID: Ricardo Cooper, male    DOB: 11/14/1945, 77 y.o.   MRN: [id number]  Chief Complaint  Patient presents with   Follow-up    Referring provider: Cleatis Polka., MD  HPI: 77 year old male former smoker followed for COPD and obstructive sleep apnea, lung nodules (stable on serial CT) Echo history significant for ulcerative colitis, A-fib  TEST/EVENTS :  NPSG 11/21/97- AHI 36/ hr, desaturation to 85%, body weight 215 lbs   CT chest November 2021 no evidence of acute process, no new or suspicious nodules.  Left upper and right upper lung nodules have resolved.  Unchanged 7.5 mm left upper lobe lung nodule unchanged since 2015 consistent with a benign etiology  01/07/2023 Follow up : COPD and OSA Patient returns for a follow-up visit.  Last seen November 2022.  Patient has underlying COPD.  He remains on Trelegy inhaler daily.  Uses albuterol on occasion.  Says overall breathing is doing well.  Denies any flare of cough or wheezing. Says he active at home. Works in the yard and garden.  Getting flu shot next month with PCP.   Patient has sleep apnea.  Is on CPAP at bedtime.  Says he wears a CPAP every single night.  Cannot sleep without it.  Feels he benefits from CPAP with decreased daytime sleepiness . Has been on it for greater than 20 years.  CPAP download shows 100% compliance.  Daily average usage at 7.5 hours.  Patient is on auto CPAP 8 to 15 cm H2O.  AHI 0.7.  Daily average pressure at 13.6 cm H2O. Uses nasal pillows   History of lung nodularity. Followed on serial CT chest scans. Last CT chest November 2021 showed stable nodularity.  He denies any hemoptysis or chest pain.  No unintentional weight loss     Allergies  Allergen Reactions   Ibuprofen Other (See Comments)    REACTION: causes colitis flare Pt was told not to take Ibuprofen d/t reaction   Ozempic (0.25 Or 0.5 Mg-Dose) [Semaglutide(0.25 Or 0.5mg -Dos)]     nausea    Immunization History   Administered Date(s) Administered   Influenza Split 01/14/2011, 01/14/2012, 01/24/2013, 01/24/2014, 01/24/2017   Influenza Whole 12/25/2009   Influenza,inj,Quad PF,6+ Mos 12/28/2014   Influenza,inj,Quad PF,6-35 Mos 12/26/2018   Influenza-Unspecified 12/30/2015, 01/14/2018   PFIZER(Purple Top)SARS-COV-2 Vaccination 05/24/2019   Pneumococcal Conjugate-13 10/24/2013   Pneumococcal Polysaccharide-23 02/01/2008, 11/24/2012   Zoster Recombinant(Shingrix) 02/22/2018, 05/02/2018    Past Medical History:  Diagnosis Date   Allergic rhinitis    Allergy    Cataract    beginnings of cataracts    CHF (congestive heart failure) (HCC)    COPD (chronic obstructive pulmonary disease) (HCC)    Diverticulosis    GERD (gastroesophageal reflux disease)    no issues with CPAP-1999   HTN (hypertension)    Hyperlipidemia    Obesity    Obstructive sleep apnea on CPAP    Paroxysmal atrial fibrillation (HCC) 2005   past trauma and infection   Skin cancer    basel cell on nose,arm,leg   Sleep apnea    with CPAP   Ulcerative colitis    Ulcerative colitis, left sided (HCC)     Tobacco History: Social History   Tobacco Use  Smoking Status Former   Current packs/day: 0.00   Average packs/day: 1 pack/day for 35.0 years (35.0 ttl pk-yrs)   Types: Cigarettes   Start date: 04/26/1966   Quit date: 04/26/2001   Years since quitting: 21.7  Smokeless Tobacco Former   Types: Chew   Quit date: 06/25/1991  Tobacco Comments   quit in 2005   Counseling given: Not Answered Tobacco comments: quit in 2005   Outpatient Medications Prior to Visit  Medication Sig Dispense Refill   acetaminophen (TYLENOL) 500 MG tablet Take 500 mg by mouth every 6 (six) hours as needed for mild pain.     diltiazem (CARDIZEM CD) 180 MG 24 hr capsule Take 1 capsule (180 mg total) by mouth daily. 90 capsule 3   ezetimibe (ZETIA) 10 MG tablet Take 10 mg by mouth daily.     fish oil-omega-3 fatty acids 1000 MG capsule Take 2 g by  mouth daily.     folic acid (FOLVITE) 1 MG tablet Take 1 tablet (1 mg total) by mouth every morning. 90 tablet 3   losartan-hydrochlorothiazide (HYZAAR) 50-12.5 MG tablet Take 1 tablet by mouth daily. 90 tablet 3   mercaptopurine (PURINETHOL) 50 MG tablet Take 2 tablets (100 mg total) by mouth daily. TAKE TWO TABLETS BY MOUTH BEFORE BREAKFAST 180 tablet 3   Multiple Vitamins-Minerals (PRESERVISION AREDS 2) CAPS take 1 po BID Oral     pravastatin (PRAVACHOL) 40 MG tablet Take 40 mg by mouth daily.     sulfaSALAzine (AZULFIDINE) 500 MG tablet Take 2 tablets (1,000 mg total) by mouth 3 (three) times daily. TAKE TWO TABLETS BY MOUTH THREE TIMES DAILY 540 tablet 3   tadalafil (CIALIS) 5 MG tablet Take 5 mg by mouth at bedtime.     tobramycin (TOBREX) 0.3 % ophthalmic solution Place 1 drop into both eyes in the morning, at noon, in the evening, and at bedtime. Once a month     vitamin B-12 (CYANOCOBALAMIN) 250 MCG tablet Take 1,000 mcg by mouth daily.     Vitamin D, Cholecalciferol, 25 MCG (1000 UT) CAPS Take 1 capsule by mouth daily.     albuterol (VENTOLIN HFA) 108 (90 Base) MCG/ACT inhaler Inhale 2 puffs into the lungs every 6 (six) hours as needed.     TRELEGY ELLIPTA 100-62.5-25 MCG/INH AEPB INHALE 1 PUFF BY MOUTH EVERY DAY 60 each 12   cyclobenzaprine (FLEXERIL) 10 MG tablet Take 10 mg by mouth 3 (three) times daily as needed.     vitamin C (ASCORBIC ACID) 250 MG tablet Take 500 mg by mouth. Every other day     No facility-administered medications prior to visit.     Review of Systems:   Constitutional:   No  weight loss, night sweats,  Fevers, chills, + fatigue, or  lassitude.  HEENT:   No headaches,  Difficulty swallowing,  Tooth/dental problems, or  Sore throat,                No sneezing, itching, ear ache, nasal congestion, post nasal drip,   CV:  No chest pain,  Orthopnea, PND, swelling in lower extremities, anasarca, dizziness, palpitations, syncope.   GI  No heartburn,  indigestion, abdominal pain, nausea, vomiting, diarrhea, change in bowel habits, loss of appetite, bloody stools.   Resp:   No chest wall deformity  Skin: no rash or lesions.  GU: no dysuria, change in color of urine, no urgency or frequency.  No flank pain, no hematuria   MS:  No joint pain or swelling.  No decreased range of motion.  No back pain.    Physical Exam  BP 120/78 (BP Location: Left Arm, Cuff Size: Large)   Pulse 73   Ht 5' 11.5" (1.816 m)  Wt 243 lb (110.2 kg)   SpO2 92%   BMI 33.42 kg/m   GEN: A/Ox3; pleasant , NAD, well nourished    HEENT:  Mark/AT,  EACs-clear, TMs-wnl, NOSE-clear, THROAT-clear, no lesions, no postnasal drip or exudate noted.   NECK:  Supple w/ fair ROM; no JVD; normal carotid impulses w/o bruits; no thyromegaly or nodules palpated; no lymphadenopathy.    RESP  Clear  P & A; w/o, wheezes/ rales/ or rhonchi. no accessory muscle use, no dullness to percussion  CARD:  RRR, no m/r/g, no peripheral edema, pulses intact, no cyanosis or clubbing.  GI:   Soft & nt; nml bowel sounds; no organomegaly or masses detected.   Musco: Warm bil, no deformities or joint swelling noted.   Neuro: alert, no focal deficits noted.    Skin: Warm, no lesions or rashes    Lab Results:  CBC    Component Value Date/Time   WBC 11.9 (H) 03/17/2021 1631   RBC 4.19 (L) 03/17/2021 1631   HGB 14.1 03/17/2021 1631   HCT 43.8 03/17/2021 1631   PLT 192.0 03/17/2021 1631   MCV 104.3 (H) 03/17/2021 1631   MCH 32.4 11/01/2009 0500   MCHC 32.3 03/17/2021 1631   RDW 14.7 03/17/2021 1631   LYMPHSABS 0.7 03/17/2021 1631   MONOABS 1.6 (H) 03/17/2021 1631   EOSABS 0.1 03/17/2021 1631   BASOSABS 0.1 03/17/2021 1631    BMET    Component Value Date/Time   NA 137 03/17/2021 1631   K 3.5 03/17/2021 1631   CL 99 03/17/2021 1631   CO2 30 03/17/2021 1631   GLUCOSE 117 (H) 03/17/2021 1631   BUN 19 03/17/2021 1631   CREATININE 1.00 03/17/2021 1631   CALCIUM 8.9  03/17/2021 1631   GFRNONAA 70.38 03/23/2010 0847   GFRAA  11/01/2009 0500    >60        The eGFR has been calculated using the MDRD equation. This calculation has not been validated in all clinical situations. eGFR's persistently <60 mL/min signify possible Chronic Kidney Disease.    BNP No results found for: "BNP"  ProBNP No results found for: "PROBNP"  Imaging: No results found.  Administration History     None           No data to display          No results found for: "NITRICOXIDE"      Assessment & Plan:   COPD GOLD II Moderate COPD -maintained on Trelegy  Flu shot this fall  Check chest xray today  Plan  . Patient Instructions  Continue on Trelegy 1 puff daily, rinse after use Albuterol inhaler as needed Activity as tolerated Chest xray today  Continue on CPAP at bedtime Work on healthy weight Do not drive if sleepy Follow-up with Dr. Delton Coombes in 6 months and as needed    Obstructive sleep apnea Excellent control and compliance on nocturnal CPAP.  Has perceived benefit continue on current settings order for new CPAP.  Plan Patient Instructions  Continue on Trelegy 1 puff daily, rinse after use Albuterol inhaler as needed Activity as tolerated Chest xray today  Continue on CPAP at bedtime Work on healthy weight Do not drive if sleepy Follow-up with Dr. Delton Coombes in 6 months and as needed    Multiple lung nodules Stable lung nodularity.  CT chest November 2021 showed resolved left upper and right upper lobe nodules.  7.5 mm left upper lobe nodule unchanged since 2015.  Consistent with benign etiology.  Rubye Oaks, NP 01/07/2023

## 2023-01-07 NOTE — Assessment & Plan Note (Signed)
Stable lung nodularity.  CT chest November 2021 showed resolved left upper and right upper lobe nodules.  7.5 mm left upper lobe nodule unchanged since 2015.  Consistent with benign etiology.

## 2023-01-07 NOTE — Patient Instructions (Addendum)
Continue on Trelegy 1 puff daily, rinse after use Albuterol inhaler as needed Activity as tolerated Chest xray today  Continue on CPAP at bedtime Work on healthy weight Do not drive if sleepy Follow-up with Dr. Delton Coombes in 6 months and as needed

## 2023-01-07 NOTE — Addendum Note (Signed)
Addended by: Carnella Guadalajara on: 01/07/2023 04:24 PM   Modules accepted: Orders

## 2023-01-07 NOTE — Assessment & Plan Note (Signed)
Moderate COPD -maintained on Trelegy  Flu shot this fall  Check chest xray today  Plan  . Patient Instructions  Continue on Trelegy 1 puff daily, rinse after use Albuterol inhaler as needed Activity as tolerated Chest xray today  Continue on CPAP at bedtime Work on healthy weight Do not drive if sleepy Follow-up with Dr. Delton Coombes in 6 months and as needed

## 2023-01-07 NOTE — Assessment & Plan Note (Signed)
Excellent control and compliance on nocturnal CPAP.  Has perceived benefit continue on current settings order for new CPAP.  Plan Patient Instructions  Continue on Trelegy 1 puff daily, rinse after use Albuterol inhaler as needed Activity as tolerated Chest xray today  Continue on CPAP at bedtime Work on healthy weight Do not drive if sleepy Follow-up with Dr. Delton Coombes in 6 months and as needed

## 2023-01-11 DIAGNOSIS — M25512 Pain in left shoulder: Secondary | ICD-10-CM | POA: Diagnosis not present

## 2023-01-11 DIAGNOSIS — M7502 Adhesive capsulitis of left shoulder: Secondary | ICD-10-CM | POA: Diagnosis not present

## 2023-01-14 DIAGNOSIS — M7502 Adhesive capsulitis of left shoulder: Secondary | ICD-10-CM | POA: Diagnosis not present

## 2023-01-14 DIAGNOSIS — M25512 Pain in left shoulder: Secondary | ICD-10-CM | POA: Diagnosis not present

## 2023-01-17 DIAGNOSIS — G4733 Obstructive sleep apnea (adult) (pediatric): Secondary | ICD-10-CM | POA: Diagnosis not present

## 2023-01-18 DIAGNOSIS — M25512 Pain in left shoulder: Secondary | ICD-10-CM | POA: Diagnosis not present

## 2023-01-18 DIAGNOSIS — M7502 Adhesive capsulitis of left shoulder: Secondary | ICD-10-CM | POA: Diagnosis not present

## 2023-01-21 DIAGNOSIS — M7502 Adhesive capsulitis of left shoulder: Secondary | ICD-10-CM | POA: Diagnosis not present

## 2023-01-21 DIAGNOSIS — M25512 Pain in left shoulder: Secondary | ICD-10-CM | POA: Diagnosis not present

## 2023-01-26 DIAGNOSIS — R7301 Impaired fasting glucose: Secondary | ICD-10-CM | POA: Diagnosis not present

## 2023-01-26 DIAGNOSIS — I10 Essential (primary) hypertension: Secondary | ICD-10-CM | POA: Diagnosis not present

## 2023-01-26 DIAGNOSIS — N400 Enlarged prostate without lower urinary tract symptoms: Secondary | ICD-10-CM | POA: Diagnosis not present

## 2023-01-26 DIAGNOSIS — E785 Hyperlipidemia, unspecified: Secondary | ICD-10-CM | POA: Diagnosis not present

## 2023-01-26 DIAGNOSIS — E291 Testicular hypofunction: Secondary | ICD-10-CM | POA: Diagnosis not present

## 2023-02-02 DIAGNOSIS — I10 Essential (primary) hypertension: Secondary | ICD-10-CM | POA: Diagnosis not present

## 2023-02-02 DIAGNOSIS — Z1339 Encounter for screening examination for other mental health and behavioral disorders: Secondary | ICD-10-CM | POA: Diagnosis not present

## 2023-02-02 DIAGNOSIS — I2510 Atherosclerotic heart disease of native coronary artery without angina pectoris: Secondary | ICD-10-CM | POA: Diagnosis not present

## 2023-02-02 DIAGNOSIS — I77811 Abdominal aortic ectasia: Secondary | ICD-10-CM | POA: Diagnosis not present

## 2023-02-02 DIAGNOSIS — G4733 Obstructive sleep apnea (adult) (pediatric): Secondary | ICD-10-CM | POA: Diagnosis not present

## 2023-02-02 DIAGNOSIS — R82998 Other abnormal findings in urine: Secondary | ICD-10-CM | POA: Diagnosis not present

## 2023-02-02 DIAGNOSIS — E669 Obesity, unspecified: Secondary | ICD-10-CM | POA: Diagnosis not present

## 2023-02-02 DIAGNOSIS — Z1331 Encounter for screening for depression: Secondary | ICD-10-CM | POA: Diagnosis not present

## 2023-02-02 DIAGNOSIS — R972 Elevated prostate specific antigen [PSA]: Secondary | ICD-10-CM | POA: Diagnosis not present

## 2023-02-02 DIAGNOSIS — E8881 Metabolic syndrome: Secondary | ICD-10-CM | POA: Diagnosis not present

## 2023-02-02 DIAGNOSIS — N400 Enlarged prostate without lower urinary tract symptoms: Secondary | ICD-10-CM | POA: Diagnosis not present

## 2023-02-02 DIAGNOSIS — R7301 Impaired fasting glucose: Secondary | ICD-10-CM | POA: Diagnosis not present

## 2023-02-02 DIAGNOSIS — Z0000 Encounter for general adult medical examination without abnormal findings: Secondary | ICD-10-CM | POA: Diagnosis not present

## 2023-02-02 DIAGNOSIS — I700 Atherosclerosis of aorta: Secondary | ICD-10-CM | POA: Diagnosis not present

## 2023-02-02 DIAGNOSIS — Z23 Encounter for immunization: Secondary | ICD-10-CM | POA: Diagnosis not present

## 2023-02-02 DIAGNOSIS — J449 Chronic obstructive pulmonary disease, unspecified: Secondary | ICD-10-CM | POA: Diagnosis not present

## 2023-02-08 DIAGNOSIS — D485 Neoplasm of uncertain behavior of skin: Secondary | ICD-10-CM | POA: Diagnosis not present

## 2023-02-08 DIAGNOSIS — L812 Freckles: Secondary | ICD-10-CM | POA: Diagnosis not present

## 2023-02-08 DIAGNOSIS — D1801 Hemangioma of skin and subcutaneous tissue: Secondary | ICD-10-CM | POA: Diagnosis not present

## 2023-02-08 DIAGNOSIS — L821 Other seborrheic keratosis: Secondary | ICD-10-CM | POA: Diagnosis not present

## 2023-02-08 DIAGNOSIS — L820 Inflamed seborrheic keratosis: Secondary | ICD-10-CM | POA: Diagnosis not present

## 2023-02-08 DIAGNOSIS — L570 Actinic keratosis: Secondary | ICD-10-CM | POA: Diagnosis not present

## 2023-02-08 DIAGNOSIS — C44329 Squamous cell carcinoma of skin of other parts of face: Secondary | ICD-10-CM | POA: Diagnosis not present

## 2023-02-08 DIAGNOSIS — Z85828 Personal history of other malignant neoplasm of skin: Secondary | ICD-10-CM | POA: Diagnosis not present

## 2023-02-08 DIAGNOSIS — D0461 Carcinoma in situ of skin of right upper limb, including shoulder: Secondary | ICD-10-CM | POA: Diagnosis not present

## 2023-02-16 DIAGNOSIS — G4733 Obstructive sleep apnea (adult) (pediatric): Secondary | ICD-10-CM | POA: Diagnosis not present

## 2023-03-04 DIAGNOSIS — R69 Illness, unspecified: Secondary | ICD-10-CM | POA: Diagnosis not present

## 2023-03-05 IMAGING — US US AORTA
1 series · 11 of 11 positions shown · non-contrast
Comparison: Prior aortic ultrasound studies on 12/18/2015 and
11/29/2012

CLINICAL DATA: History of prior borderline abdominal aortic
enlargement by ultrasound.

EXAM:
ULTRASOUND OF ABDOMINAL AORTA
TECHNIQUE: Ultrasound examination of the abdominal aorta and proximal common
iliac arteries was performed to evaluate for aneurysm. Additional
color and Doppler images of the distal aorta were obtained to
document patency.

[Series 1: us aorta · 0.28mm/px · 11 of 11 slices shown]
[im 1/11]
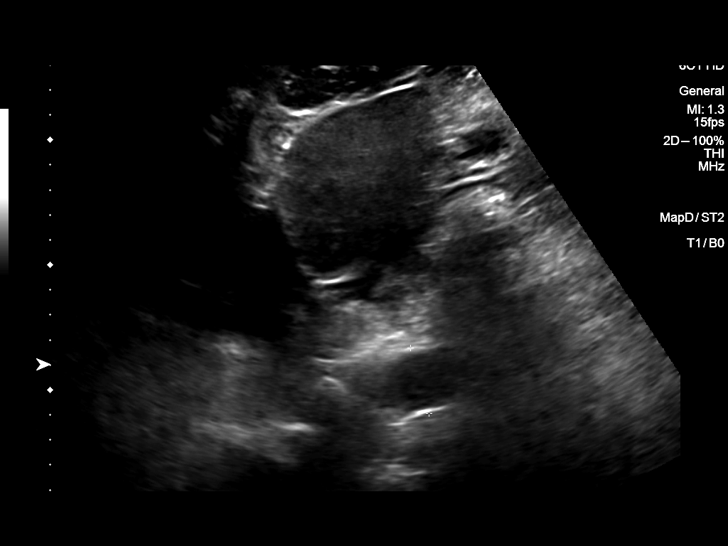
[im 2/11]
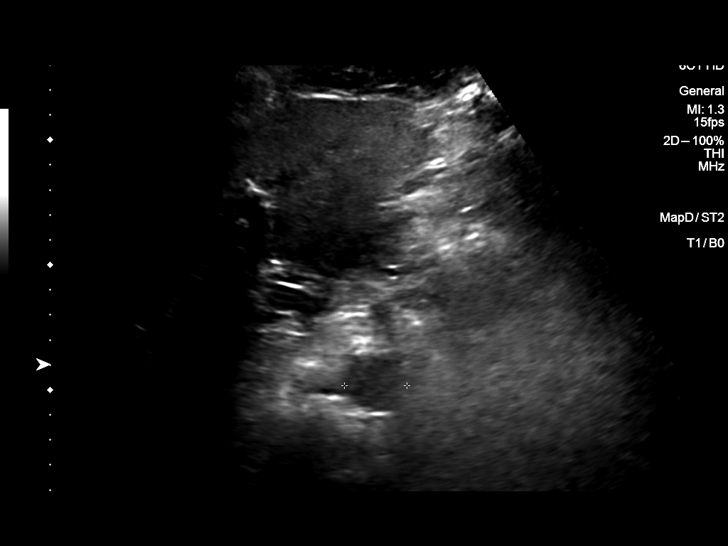
[im 3/11]
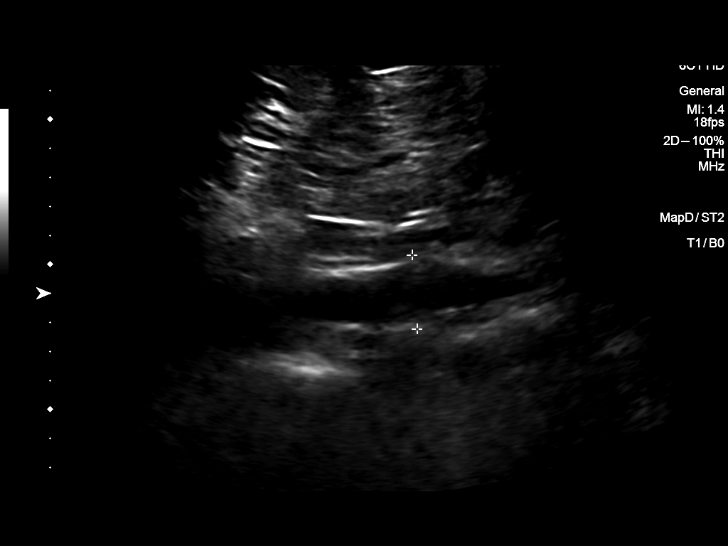
[im 4/11]
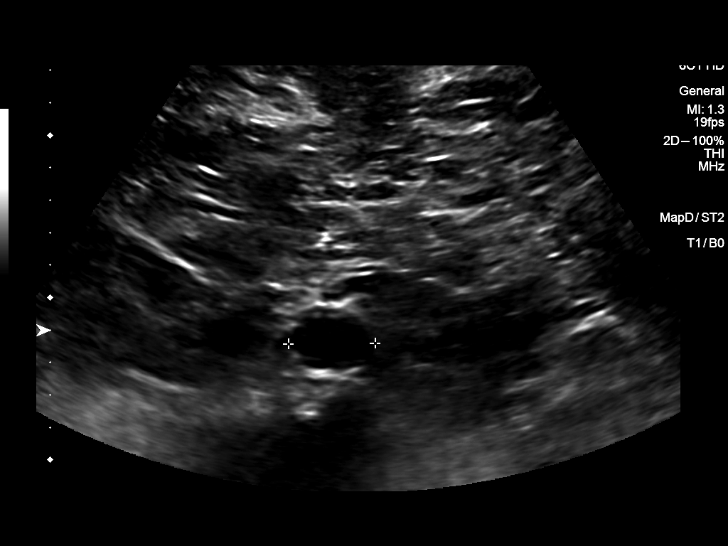
[im 5/11]
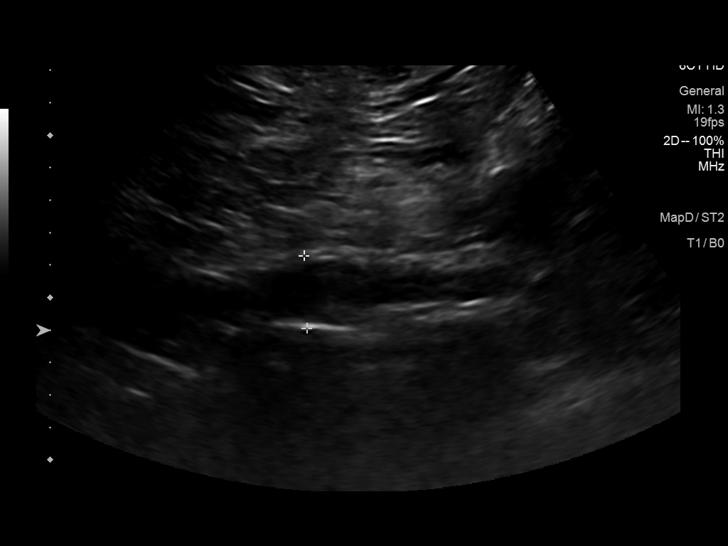
[im 6/11]
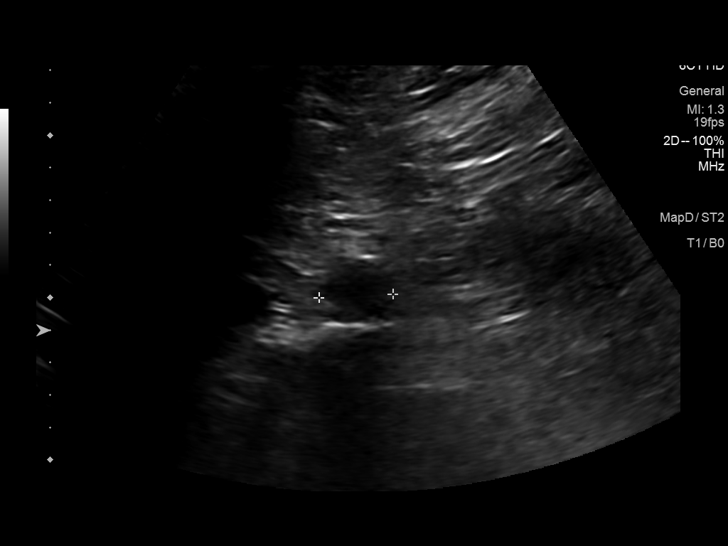
[im 7/11]
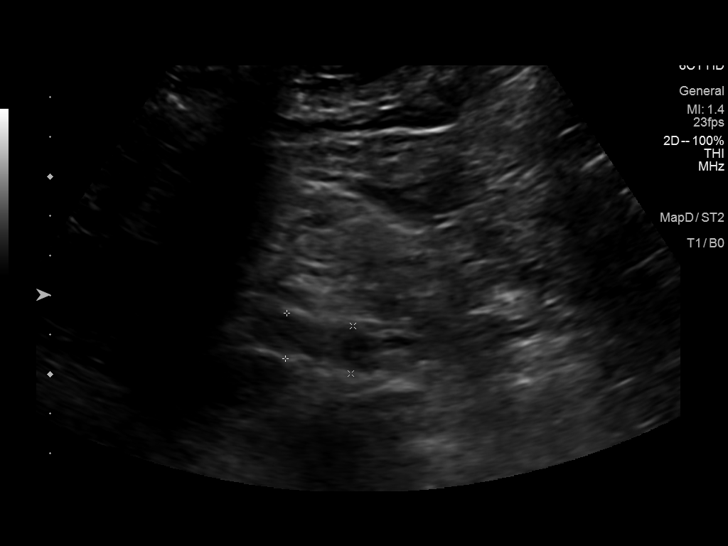
[im 8/11]
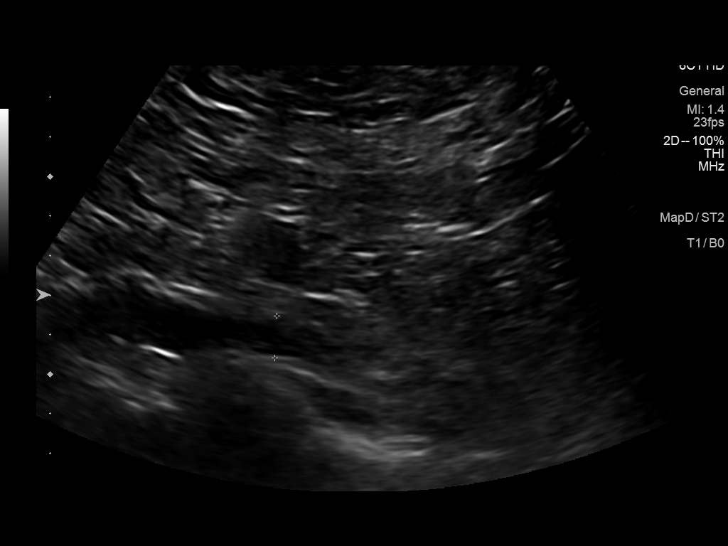
[im 9/11]
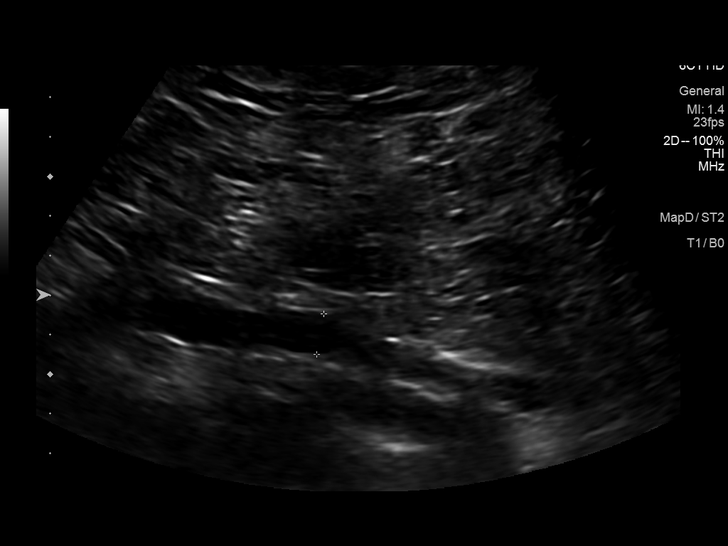
[im 10/11]
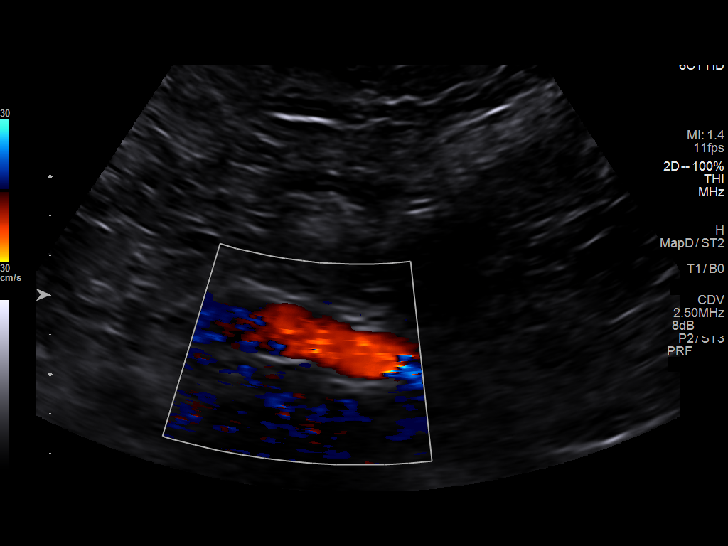
[im 11/11]
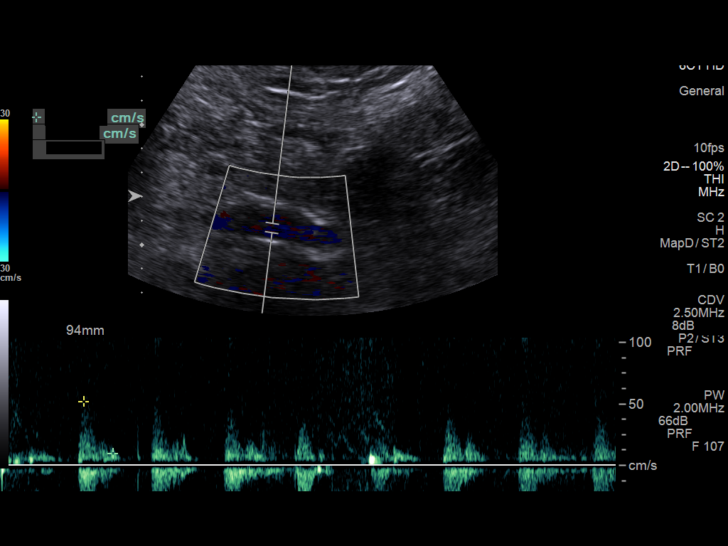

[11 of 11 positions shown; findings below may reference images not displayed]

FINDINGS: Abdominal aortic measurements as follows:

Proximal:  2.5 x 2.7 cm

Mid:  2.6 x 2.7 cm

Distal:  2.2 x 2.3 cm
Patent: Yes, peak systolic velocity is 52 cm/s

Atherosclerotic plaque visualized in the abdominal aorta.

Right common iliac artery: 1.1 cm

Left common iliac artery: 1.0 cm
IMPRESSION: Aortic atherosclerosis without evidence of abdominal aortic
aneurysm. There is no further follow-up recommendation as the aorta
is not considered aneurysmal on the current study.

## 2023-03-08 ENCOUNTER — Other Ambulatory Visit: Payer: Self-pay | Admitting: Gastroenterology

## 2023-03-08 ENCOUNTER — Ambulatory Visit: Payer: Medicare HMO | Admitting: Adult Health

## 2023-03-08 ENCOUNTER — Encounter: Payer: Self-pay | Admitting: Adult Health

## 2023-03-08 VITALS — BP 126/70 | HR 74 | Temp 98.2°F | Ht 71.5 in | Wt 242.4 lb

## 2023-03-08 DIAGNOSIS — J449 Chronic obstructive pulmonary disease, unspecified: Secondary | ICD-10-CM

## 2023-03-08 DIAGNOSIS — G4733 Obstructive sleep apnea (adult) (pediatric): Secondary | ICD-10-CM

## 2023-03-08 NOTE — Assessment & Plan Note (Signed)
Stable on current regimen  Check PFT on return   Plan  Patient Instructions  Continue on Trelegy 1 puff daily, rinse after use Albuterol inhaler as needed Activity as tolerated  Continue on CPAP at bedtime Work on healthy weight Do not drive if sleepy Follow-up with Dr. Delton Coombes in 6 months with PFTs and as needed

## 2023-03-08 NOTE — Assessment & Plan Note (Signed)
Excellent control and compliance on nocturnal CPAP  Plan  Patient Instructions  Continue on Trelegy 1 puff daily, rinse after use Albuterol inhaler as needed Activity as tolerated  Continue on CPAP at bedtime Work on healthy weight Do not drive if sleepy Follow-up with Dr. Delton Coombes in 6 months with PFTs and as needed

## 2023-03-08 NOTE — Patient Instructions (Addendum)
Continue on Trelegy 1 puff daily, rinse after use Albuterol inhaler as needed Activity as tolerated  Continue on CPAP at bedtime Work on healthy weight Do not drive if sleepy Follow-up with Dr. Delton Coombes in 6 months with PFTs and as needed

## 2023-03-08 NOTE — Progress Notes (Signed)
@Patient  ID: Ricardo Cooper, male    DOB: 1945/10/06, 77 y.o.   MRN: [id number]  Chief Complaint  Patient presents with   Follow-up    Referring provider: Cleatis Polka., MD  HPI: 77 year old male former smoker followed for COPD and obstructive sleep apnea, lung nodules (stable on serial CT) medical history significant for ulcerative colitis and atrial fibrillation  TEST/EVENTS :  NPSG 11/21/97- AHI 36/ hr, desaturation to 85%, body weight 215 lbs    CT chest November 2021 no evidence of acute process, no new or suspicious nodules.  Left upper and right upper lung nodules have resolved.  Unchanged 7.5 mm left upper lobe lung nodule unchanged since 2015 consistent with a benign etiology  Chest xray 12/2022 Clear lungs, stable scarring at the lung apices right greater than left  03/08/2023 Follow up : COPD and OSA Patient returns for follow-up visit.  Patient is followed for COPD.  He says he is doing well with his breathing.  Remains on Trelegy inhaler daily.  We did discuss repeating PFTs on follow-up visit. He denies a flare of cough or wheezing.  Patient has severe sleep apnea is on CPAP therapy.  Recently got a new CPAP machine.  Says it is working very well.  It is much quieter and pressures feel very comfortable.  Feels that he benefits from CPAP with decreased daytime sleepiness CPAP download shows excellent compliance with 100% usage.  Daily average usage at 7.5 hours.  Patient is on auto CPAP 8 to 15 cm H2O.  Daily average pressure at 13.5 cm H2O.  AHI 0.7/hour. Uses DreamWear nasal pillows.  Allergies  Allergen Reactions   Ibuprofen Other (See Comments)    REACTION: causes colitis flare Pt was told not to take Ibuprofen d/t reaction   Ozempic (0.25 Or 0.5 Mg-Dose) [Semaglutide(0.25 Or 0.5mg -Dos)]     nausea    Immunization History  Administered Date(s) Administered   Influenza Split 01/14/2011, 01/14/2012, 01/24/2013, 01/24/2014, 01/24/2017   Influenza Whole  12/25/2009   Influenza,inj,Quad PF,6+ Mos 12/28/2014   Influenza,inj,Quad PF,6-35 Mos 12/26/2018   Influenza-Unspecified 12/30/2015, 01/14/2018   PFIZER(Purple Top)SARS-COV-2 Vaccination 05/24/2019   Pneumococcal Conjugate-13 10/24/2013   Pneumococcal Polysaccharide-23 02/01/2008, 11/24/2012   Zoster Recombinant(Shingrix) 02/22/2018, 05/02/2018    Past Medical History:  Diagnosis Date   Allergic rhinitis    Allergy    Cataract    beginnings of cataracts    CHF (congestive heart failure) (HCC)    COPD (chronic obstructive pulmonary disease) (HCC)    Diverticulosis    GERD (gastroesophageal reflux disease)    no issues with CPAP-1999   HTN (hypertension)    Hyperlipidemia    Obesity    Obstructive sleep apnea on CPAP    Paroxysmal atrial fibrillation (HCC) 2005   past trauma and infection   Skin cancer    basel cell on nose,arm,leg   Sleep apnea    with CPAP   Ulcerative colitis    Ulcerative colitis, left sided (HCC)     Tobacco History: Social History   Tobacco Use  Smoking Status Former   Current packs/day: 0.00   Average packs/day: 1 pack/day for 35.0 years (35.0 ttl pk-yrs)   Types: Cigarettes   Start date: 04/26/1966   Quit date: 04/26/2001   Years since quitting: 21.8  Smokeless Tobacco Former   Types: Chew   Quit date: 06/25/1991  Tobacco Comments   quit in 2005   Counseling given: Not Answered Tobacco comments: quit in 2005  Outpatient Medications Prior to Visit  Medication Sig Dispense Refill   acetaminophen (TYLENOL) 500 MG tablet Take 500 mg by mouth every 6 (six) hours as needed for mild pain.     albuterol (VENTOLIN HFA) 108 (90 Base) MCG/ACT inhaler Inhale 1-2 puffs into the lungs every 6 (six) hours as needed. 8 g 3   diltiazem (CARDIZEM CD) 180 MG 24 hr capsule Take 1 capsule (180 mg total) by mouth daily. 90 capsule 3   ezetimibe (ZETIA) 10 MG tablet Take 10 mg by mouth daily.     fish oil-omega-3 fatty acids 1000 MG capsule Take 2 g by mouth  daily.     Fluticasone-Umeclidin-Vilant (TRELEGY ELLIPTA) 100-62.5-25 MCG/ACT AEPB Inhale 1 puff into the lungs daily. 1 each 11   folic acid (FOLVITE) 1 MG tablet Take 1 tablet (1 mg total) by mouth every morning. 90 tablet 3   losartan-hydrochlorothiazide (HYZAAR) 50-12.5 MG tablet Take 1 tablet by mouth daily. 90 tablet 3   mercaptopurine (PURINETHOL) 50 MG tablet TAKE 2 TABLETS BY MOUTH ONCE DAILY BEFORE BREAKFAST 180 tablet 1   Multiple Vitamins-Minerals (PRESERVISION AREDS 2) CAPS take 1 po BID Oral     pravastatin (PRAVACHOL) 40 MG tablet Take 40 mg by mouth daily.     sulfaSALAzine (AZULFIDINE) 500 MG tablet Take 2 tablets (1,000 mg total) by mouth 3 (three) times daily. TAKE TWO TABLETS BY MOUTH THREE TIMES DAILY 540 tablet 3   tadalafil (CIALIS) 5 MG tablet Take 5 mg by mouth at bedtime.     tobramycin (TOBREX) 0.3 % ophthalmic solution Place 1 drop into both eyes in the morning, at noon, in the evening, and at bedtime. Once a month     vitamin B-12 (CYANOCOBALAMIN) 250 MCG tablet Take 1,000 mcg by mouth daily.     Vitamin D, Cholecalciferol, 25 MCG (1000 UT) CAPS Take 1 capsule by mouth daily.     No facility-administered medications prior to visit.     Review of Systems:   Constitutional:   No  weight loss, night sweats,  Fevers, chills, fatigue, or  lassitude.  HEENT:   No headaches,  Difficulty swallowing,  Tooth/dental problems, or  Sore throat,                No sneezing, itching, ear ache, nasal congestion, post nasal drip,   CV:  No chest pain,  Orthopnea, PND, swelling in lower extremities, anasarca, dizziness, palpitations, syncope.   GI  No heartburn, indigestion, abdominal pain, nausea, vomiting, diarrhea, change in bowel habits, loss of appetite, bloody stools.   Resp: No shortness of breath with exertion or at rest.  No excess mucus, no productive cough,  No non-productive cough,  No coughing up of blood.  No change in color of mucus.  No wheezing.  No chest wall  deformity  Skin: no rash or lesions.  GU: no dysuria, change in color of urine, no urgency or frequency.  No flank pain, no hematuria   MS:  No joint pain or swelling.  No decreased range of motion.  No back pain.    Physical Exam  BP 126/70 (BP Location: Left Arm, Patient Position: Sitting, Cuff Size: Normal)   Pulse 74   Temp 98.2 F (36.8 C) (Oral)   Ht 5' 11.5" (1.816 m)   Wt 242 lb 6.4 oz (110 kg)   SpO2 96%   BMI 33.34 kg/m   GEN: A/Ox3; pleasant , NAD, well nourished    HEENT:  [place]/AT,  NOSE-clear, THROAT-clear, no lesions, no postnasal drip or exudate noted.   NECK:  Supple w/ fair ROM; no JVD; normal carotid impulses w/o bruits; no thyromegaly or nodules palpated; no lymphadenopathy.    RESP  Clear  P & A; w/o, wheezes/ rales/ or rhonchi. no accessory muscle use, no dullness to percussion  CARD:  RRR, no m/r/g, no peripheral edema, pulses intact, no cyanosis or clubbing.  GI:   Soft & nt; nml bowel sounds; no organomegaly or masses detected.   Musco: Warm bil, no deformities or joint swelling noted.   Neuro: alert, no focal deficits noted.    Skin: Warm, no lesions or rashes    Lab Results:  CBC     BNP No results found for: "BNP"  ProBNP No results found for: "PROBNP"  Imaging: No results found.  Administration History     None           No data to display          No results found for: "NITRICOXIDE"      Assessment & Plan:   COPD GOLD II Stable on current regimen  Check PFT on return   Plan  Patient Instructions  Continue on Trelegy 1 puff daily, rinse after use Albuterol inhaler as needed Activity as tolerated  Continue on CPAP at bedtime Work on healthy weight Do not drive if sleepy Follow-up with Dr. Delton Coombes in 6 months with PFTs and as needed    Obstructive sleep apnea Excellent control and compliance on nocturnal CPAP  Plan  Patient Instructions  Continue on Trelegy 1 puff daily, rinse after  use Albuterol inhaler as needed Activity as tolerated  Continue on CPAP at bedtime Work on healthy weight Do not drive if sleepy Follow-up with Dr. Delton Coombes in 6 months with PFTs and as needed      Rubye Oaks, NP 03/08/2023

## 2023-03-15 DIAGNOSIS — C44329 Squamous cell carcinoma of skin of other parts of face: Secondary | ICD-10-CM | POA: Diagnosis not present

## 2023-03-19 DIAGNOSIS — G4733 Obstructive sleep apnea (adult) (pediatric): Secondary | ICD-10-CM | POA: Diagnosis not present

## 2023-03-29 DIAGNOSIS — D485 Neoplasm of uncertain behavior of skin: Secondary | ICD-10-CM | POA: Diagnosis not present

## 2023-03-29 DIAGNOSIS — C44622 Squamous cell carcinoma of skin of right upper limb, including shoulder: Secondary | ICD-10-CM | POA: Diagnosis not present

## 2023-04-18 DIAGNOSIS — G4733 Obstructive sleep apnea (adult) (pediatric): Secondary | ICD-10-CM | POA: Diagnosis not present

## 2023-04-29 IMAGING — DX DG CHEST 2V
2 series · 2 of 2 positions shown · non-contrast
Comparison: 04/23/2016

CLINICAL DATA: Short of breath

EXAM:
CHEST - 2 VIEW

[chest pa]
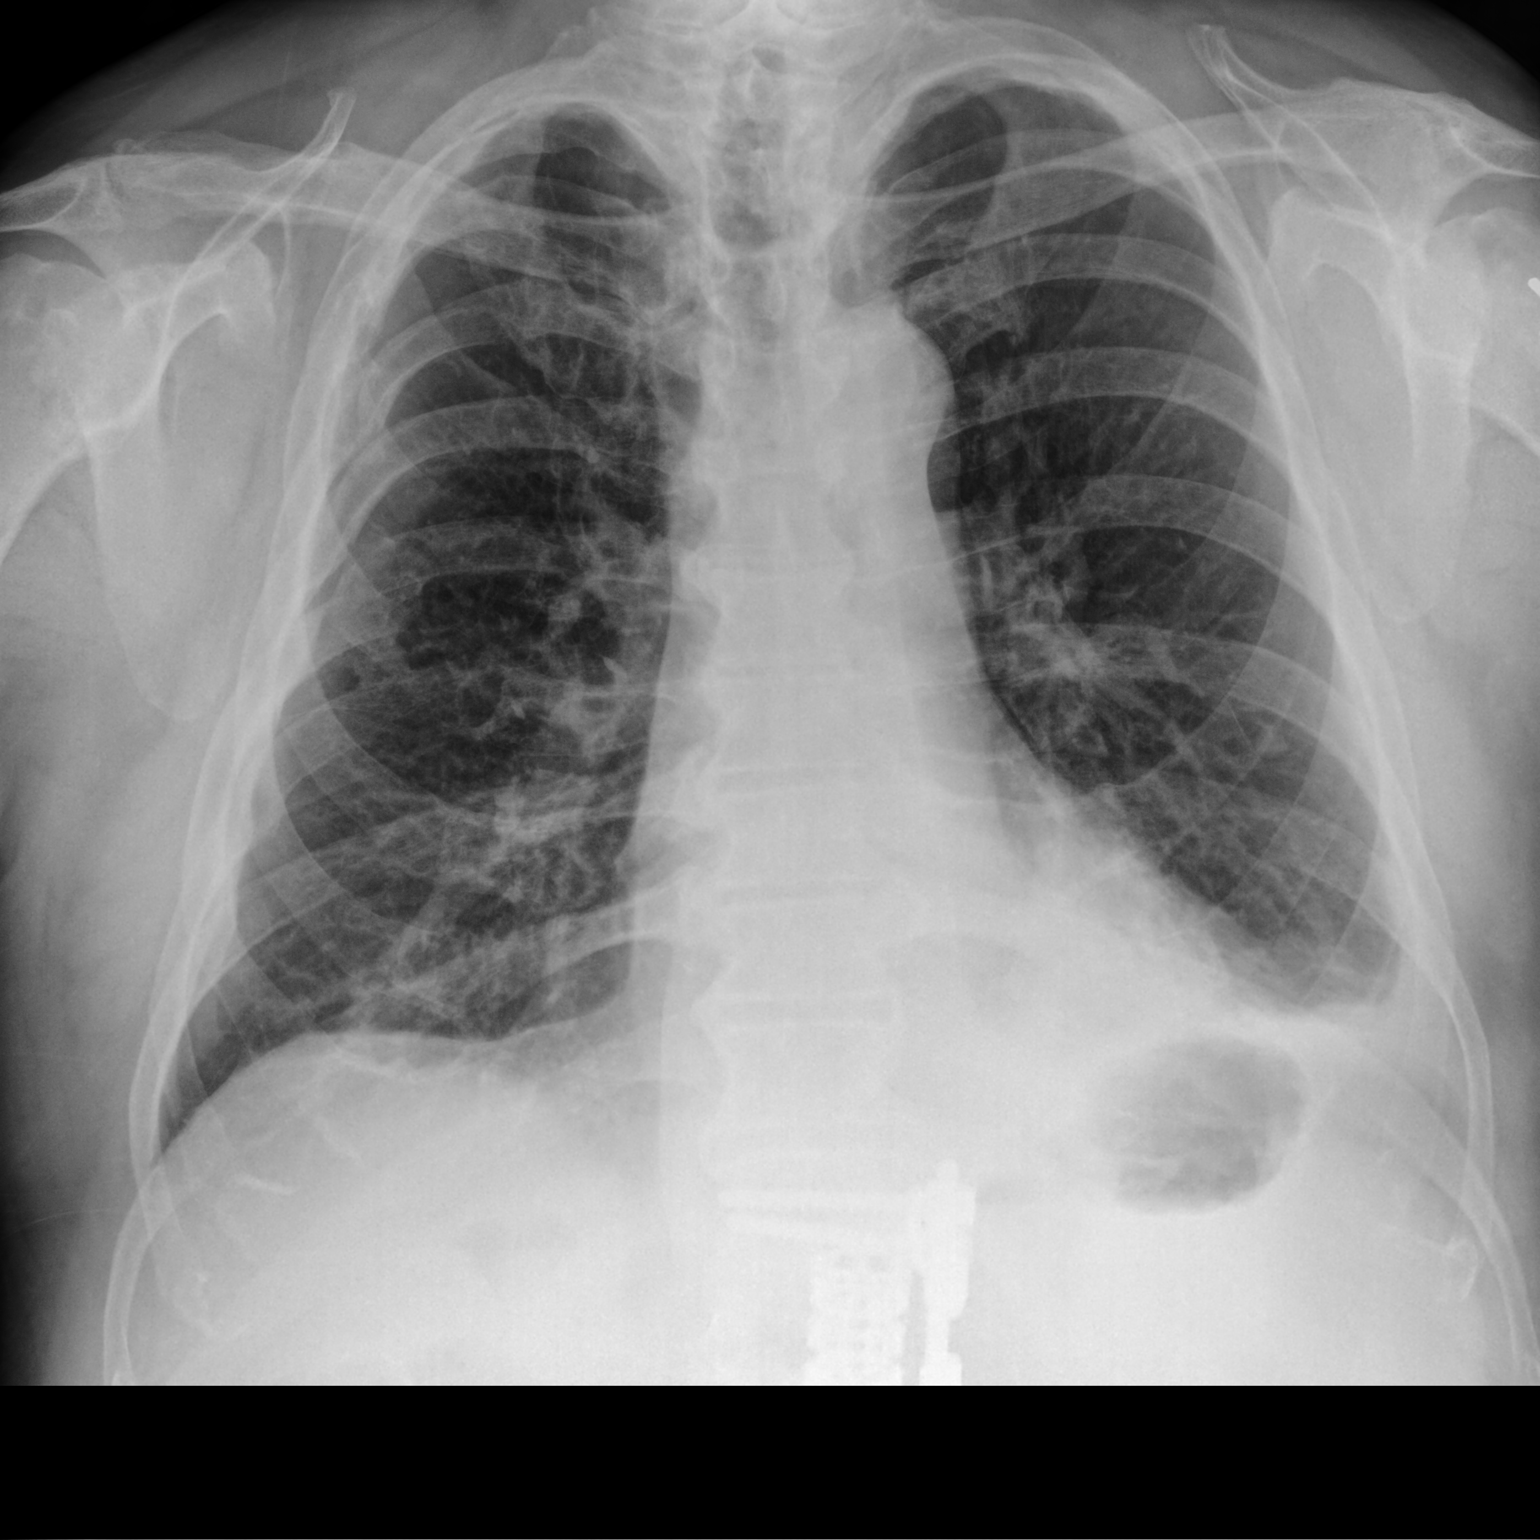

[chest lat]
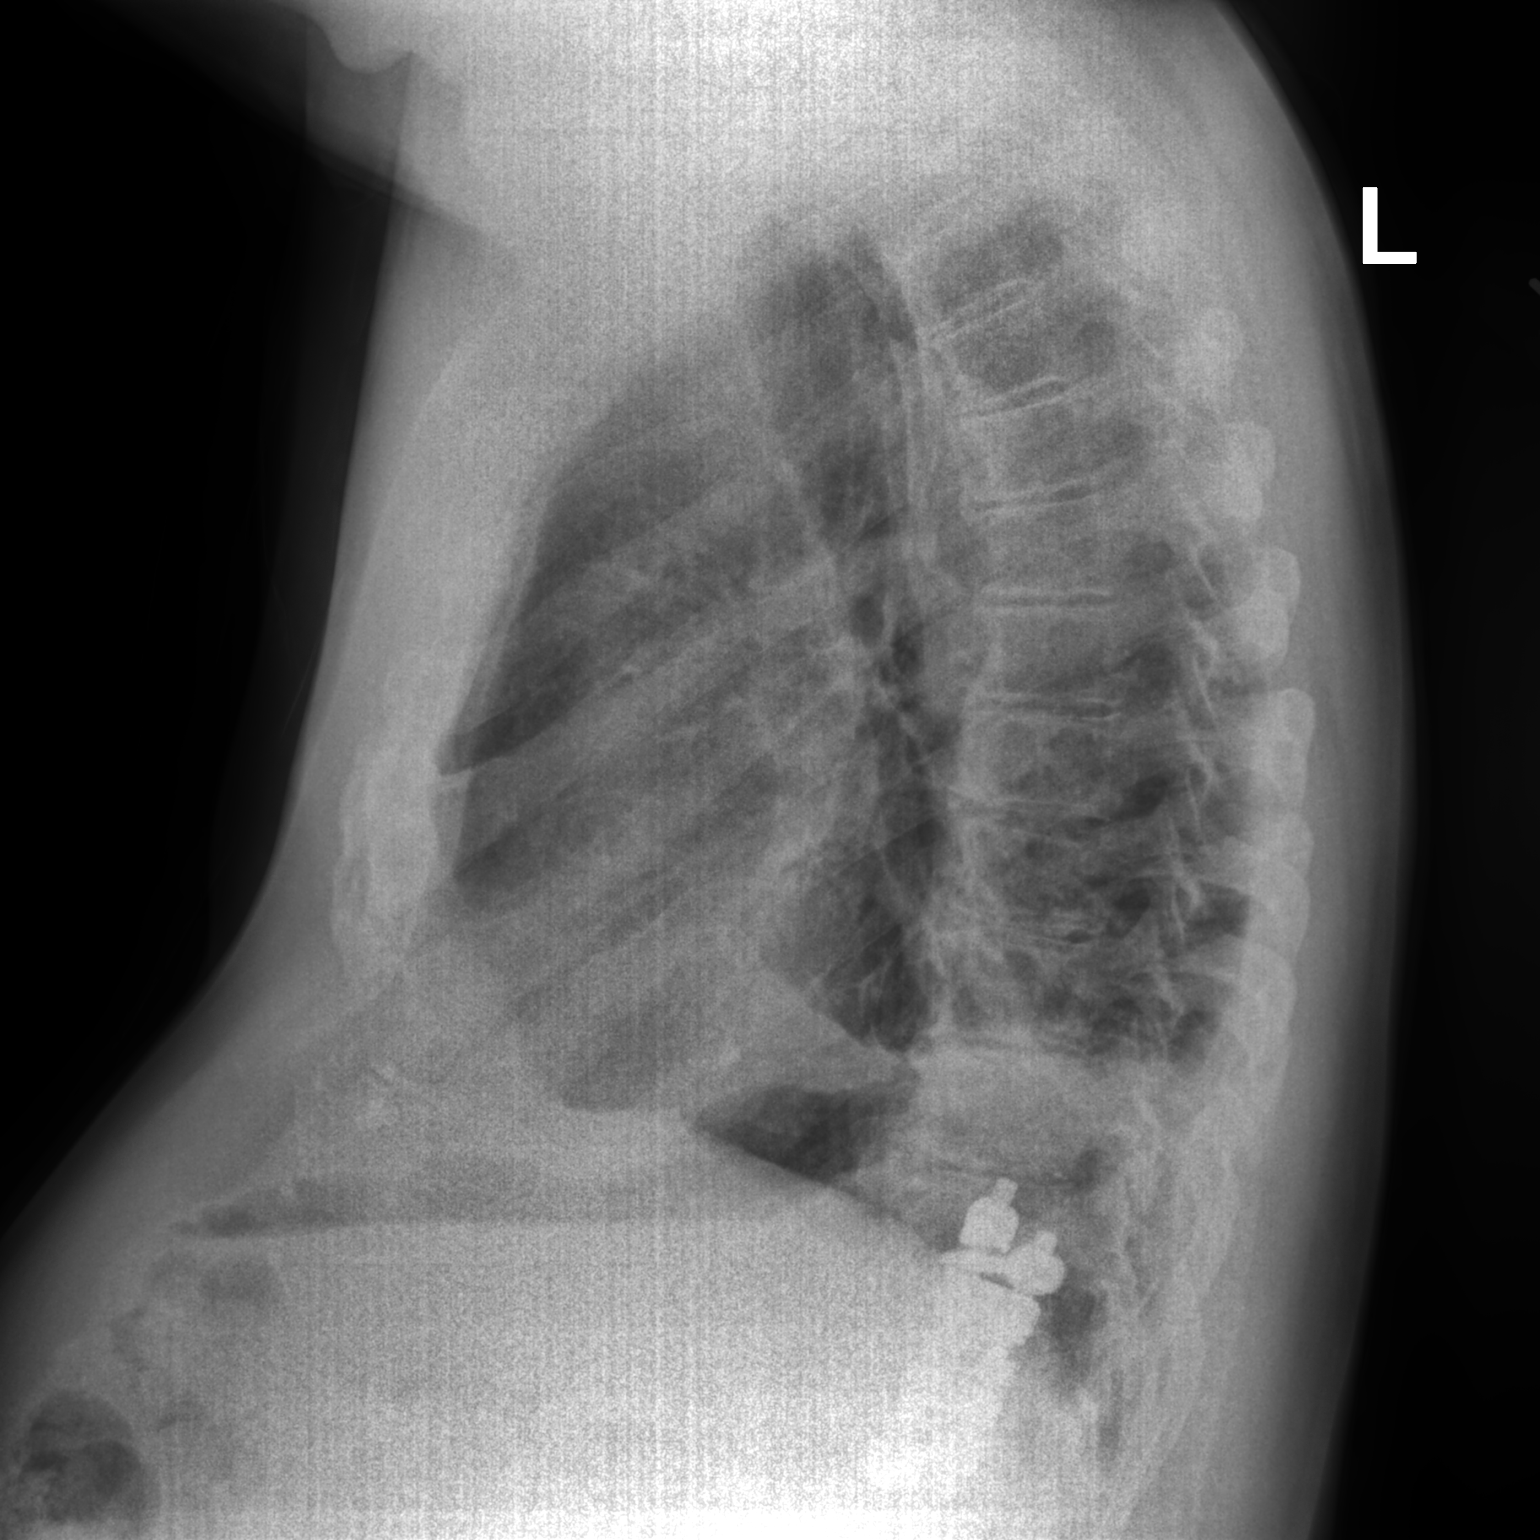

[2 of 2 positions shown; findings below may reference images not displayed]

FINDINGS: Frontal and lateral views of the chest demonstrate a stable cardiac
silhouette. Left lower lobe airspace disease identified, obscuring
the left costophrenic angle. Small left effusion is suspected. No
pneumothorax. Right chest is clear. Prior healed right rib
fractures. No acute bony abnormalities. Postsurgical changes at the
thoracolumbar junction.
IMPRESSION: 1. Left lower lobe pneumonia, with small left parapneumonic
effusion.

## 2023-05-02 DIAGNOSIS — H2513 Age-related nuclear cataract, bilateral: Secondary | ICD-10-CM | POA: Diagnosis not present

## 2023-05-02 DIAGNOSIS — H353211 Exudative age-related macular degeneration, right eye, with active choroidal neovascularization: Secondary | ICD-10-CM | POA: Diagnosis not present

## 2023-05-02 DIAGNOSIS — H43823 Vitreomacular adhesion, bilateral: Secondary | ICD-10-CM | POA: Diagnosis not present

## 2023-05-02 DIAGNOSIS — H43812 Vitreous degeneration, left eye: Secondary | ICD-10-CM | POA: Diagnosis not present

## 2023-05-02 DIAGNOSIS — H353121 Nonexudative age-related macular degeneration, left eye, early dry stage: Secondary | ICD-10-CM | POA: Diagnosis not present

## 2023-05-25 DIAGNOSIS — N400 Enlarged prostate without lower urinary tract symptoms: Secondary | ICD-10-CM | POA: Diagnosis not present

## 2023-06-02 DIAGNOSIS — L853 Xerosis cutis: Secondary | ICD-10-CM | POA: Diagnosis not present

## 2023-06-02 DIAGNOSIS — C44722 Squamous cell carcinoma of skin of right lower limb, including hip: Secondary | ICD-10-CM | POA: Diagnosis not present

## 2023-06-02 DIAGNOSIS — D0472 Carcinoma in situ of skin of left lower limb, including hip: Secondary | ICD-10-CM | POA: Diagnosis not present

## 2023-06-02 DIAGNOSIS — L821 Other seborrheic keratosis: Secondary | ICD-10-CM | POA: Diagnosis not present

## 2023-06-02 DIAGNOSIS — Z85828 Personal history of other malignant neoplasm of skin: Secondary | ICD-10-CM | POA: Diagnosis not present

## 2023-06-02 DIAGNOSIS — D485 Neoplasm of uncertain behavior of skin: Secondary | ICD-10-CM | POA: Diagnosis not present

## 2023-06-02 DIAGNOSIS — D1801 Hemangioma of skin and subcutaneous tissue: Secondary | ICD-10-CM | POA: Diagnosis not present

## 2023-06-02 DIAGNOSIS — L570 Actinic keratosis: Secondary | ICD-10-CM | POA: Diagnosis not present

## 2023-06-02 DIAGNOSIS — L812 Freckles: Secondary | ICD-10-CM | POA: Diagnosis not present

## 2023-06-02 DIAGNOSIS — D692 Other nonthrombocytopenic purpura: Secondary | ICD-10-CM | POA: Diagnosis not present

## 2023-06-03 DIAGNOSIS — N401 Enlarged prostate with lower urinary tract symptoms: Secondary | ICD-10-CM | POA: Diagnosis not present

## 2023-06-03 DIAGNOSIS — R972 Elevated prostate specific antigen [PSA]: Secondary | ICD-10-CM | POA: Diagnosis not present

## 2023-06-03 DIAGNOSIS — R3912 Poor urinary stream: Secondary | ICD-10-CM | POA: Diagnosis not present

## 2023-06-06 ENCOUNTER — Other Ambulatory Visit: Payer: Self-pay | Admitting: Gastroenterology

## 2023-06-17 DIAGNOSIS — H2513 Age-related nuclear cataract, bilateral: Secondary | ICD-10-CM | POA: Diagnosis not present

## 2023-06-17 DIAGNOSIS — H353211 Exudative age-related macular degeneration, right eye, with active choroidal neovascularization: Secondary | ICD-10-CM | POA: Diagnosis not present

## 2023-06-17 DIAGNOSIS — H52203 Unspecified astigmatism, bilateral: Secondary | ICD-10-CM | POA: Diagnosis not present

## 2023-06-21 DIAGNOSIS — H353211 Exudative age-related macular degeneration, right eye, with active choroidal neovascularization: Secondary | ICD-10-CM | POA: Diagnosis not present

## 2023-06-21 DIAGNOSIS — H43812 Vitreous degeneration, left eye: Secondary | ICD-10-CM | POA: Diagnosis not present

## 2023-06-21 DIAGNOSIS — H353121 Nonexudative age-related macular degeneration, left eye, early dry stage: Secondary | ICD-10-CM | POA: Diagnosis not present

## 2023-06-21 DIAGNOSIS — H2513 Age-related nuclear cataract, bilateral: Secondary | ICD-10-CM | POA: Diagnosis not present

## 2023-06-21 DIAGNOSIS — H43823 Vitreomacular adhesion, bilateral: Secondary | ICD-10-CM | POA: Diagnosis not present

## 2023-07-05 ENCOUNTER — Ambulatory Visit: Payer: Medicare HMO | Admitting: Emergency Medicine

## 2023-07-07 ENCOUNTER — Other Ambulatory Visit: Payer: Self-pay | Admitting: Gastroenterology

## 2023-07-25 DIAGNOSIS — G4733 Obstructive sleep apnea (adult) (pediatric): Secondary | ICD-10-CM | POA: Diagnosis not present

## 2023-07-26 ENCOUNTER — Telehealth: Payer: Self-pay | Admitting: Adult Health

## 2023-07-26 NOTE — Telephone Encounter (Signed)
 Rec'd fax from [employer] for clinical notes. Sent via fax. Once confirmation rec'd will shred.

## 2023-07-27 ENCOUNTER — Ambulatory Visit: Payer: Medicare HMO | Admitting: Emergency Medicine

## 2023-07-29 DIAGNOSIS — G4733 Obstructive sleep apnea (adult) (pediatric): Secondary | ICD-10-CM | POA: Diagnosis not present

## 2023-08-05 ENCOUNTER — Telehealth: Payer: Self-pay | Admitting: Adult Health

## 2023-08-05 NOTE — Telephone Encounter (Signed)
 Cmn received from [employer] for CPAP supplies.

## 2023-08-09 DIAGNOSIS — H353121 Nonexudative age-related macular degeneration, left eye, early dry stage: Secondary | ICD-10-CM | POA: Diagnosis not present

## 2023-08-09 DIAGNOSIS — H43823 Vitreomacular adhesion, bilateral: Secondary | ICD-10-CM | POA: Diagnosis not present

## 2023-08-09 DIAGNOSIS — H2513 Age-related nuclear cataract, bilateral: Secondary | ICD-10-CM | POA: Diagnosis not present

## 2023-08-09 DIAGNOSIS — H353211 Exudative age-related macular degeneration, right eye, with active choroidal neovascularization: Secondary | ICD-10-CM | POA: Diagnosis not present

## 2023-08-09 DIAGNOSIS — H43812 Vitreous degeneration, left eye: Secondary | ICD-10-CM | POA: Diagnosis not present

## 2023-08-15 ENCOUNTER — Other Ambulatory Visit: Payer: Self-pay | Admitting: Internal Medicine

## 2023-08-15 ENCOUNTER — Other Ambulatory Visit: Payer: Self-pay | Admitting: Gastroenterology

## 2023-08-17 ENCOUNTER — Other Ambulatory Visit (INDEPENDENT_AMBULATORY_CARE_PROVIDER_SITE_OTHER)

## 2023-08-17 ENCOUNTER — Ambulatory Visit: Payer: Self-pay | Admitting: Physician Assistant

## 2023-08-17 ENCOUNTER — Encounter: Payer: Self-pay | Admitting: Physician Assistant

## 2023-08-17 VITALS — BP 126/60 | HR 78 | Ht 71.5 in | Wt 234.4 lb

## 2023-08-17 DIAGNOSIS — K5100 Ulcerative (chronic) pancolitis without complications: Secondary | ICD-10-CM | POA: Diagnosis not present

## 2023-08-17 DIAGNOSIS — Z860101 Personal history of adenomatous and serrated colon polyps: Secondary | ICD-10-CM | POA: Diagnosis not present

## 2023-08-17 DIAGNOSIS — K5190 Ulcerative colitis, unspecified, without complications: Secondary | ICD-10-CM | POA: Diagnosis not present

## 2023-08-17 LAB — COMPREHENSIVE METABOLIC PANEL WITH GFR
ALT: 43 U/L (ref 0–53)
AST: 27 U/L (ref 0–37)
Albumin: 4.4 g/dL (ref 3.5–5.2)
Alkaline Phosphatase: 46 U/L (ref 39–117)
BUN: 16 mg/dL (ref 6–23)
CO2: 31 meq/L (ref 19–32)
Calcium: 9.3 mg/dL (ref 8.4–10.5)
Chloride: 103 meq/L (ref 96–112)
Creatinine, Ser: 0.93 mg/dL (ref 0.40–1.50)
GFR: 79.82 mL/min (ref >60.00)
Glucose, Bld: 127 mg/dL — ABNORMAL HIGH (ref 70–99)
Potassium: 4 meq/L (ref 3.5–5.1)
Sodium: 141 meq/L (ref 135–145)
Total Bilirubin: 0.7 mg/dL (ref 0.2–1.2)
Total Protein: 7.1 g/dL (ref 6.0–8.3)

## 2023-08-17 LAB — CBC WITH DIFFERENTIAL/PLATELET
Basophils Absolute: 0.1 10*3/uL (ref 0.0–0.1)
Basophils Relative: 1.9 % (ref 0.0–3.0)
Eosinophils Absolute: 0.1 10*3/uL (ref 0.0–0.7)
Eosinophils Relative: 2.7 % (ref 0.0–5.0)
HCT: 47.5 % (ref 39.0–52.0)
Hemoglobin: 15.8 g/dL (ref 13.0–17.0)
Lymphocytes Relative: 13.9 % (ref 12.0–46.0)
Lymphs Abs: 0.5 10*3/uL — ABNORMAL LOW (ref 0.7–4.0)
MCHC: 33.3 g/dL (ref 30.0–36.0)
MCV: 104.2 fl — ABNORMAL HIGH (ref 78.0–100.0)
Monocytes Absolute: 0.4 10*3/uL (ref 0.1–1.0)
Monocytes Relative: 10.5 % (ref 3.0–12.0)
Neutro Abs: 2.8 10*3/uL (ref 1.4–7.7)
Neutrophils Relative %: 71 % (ref 43.0–77.0)
Platelets: 164 10*3/uL (ref 150.0–400.0)
RBC: 4.56 Mil/uL (ref 4.22–5.81)
RDW: 14.9 % (ref 11.5–15.5)
WBC: 3.9 10*3/uL — ABNORMAL LOW (ref 4.0–10.5)

## 2023-08-17 LAB — B12 AND FOLATE PANEL
Folate: >25.2 ng/mL (ref >5.9)
Vitamin B-12: >1537 pg/mL — ABNORMAL HIGH (ref 211–911)

## 2023-08-17 LAB — C-REACTIVE PROTEIN: CRP: <1 mg/dL (ref 0.5–20.0)

## 2023-08-17 MED ORDER — FOLIC ACID 1 MG PO TABS: 1.0000 mg | ORAL_TABLET | Freq: Every day | ORAL | 3 refills | Status: AC

## 2023-08-17 MED ORDER — SULFASALAZINE 500 MG PO TABS: 1000.0000 mg | ORAL_TABLET | Freq: Three times a day (TID) | ORAL | 3 refills | Status: AC

## 2023-08-17 MED ORDER — MERCAPTOPURINE 50 MG PO TABS: 100.0000 mg | ORAL_TABLET | Freq: Every day | ORAL | 3 refills | Status: AC

## 2023-08-17 NOTE — Progress Notes (Signed)
 Brigitte Canard, PA-C [address] [place], [place]  [address] Phone: [phone]   Primary Care Physician: Jeannine Milroy., MD  Primary Gastroenterologist:  Brigitte Canard, PA-C / Dr. Laurell Pond   Chief Complaint: Follow-up ulcerative colitis; medication refill   HPI:   Ricardo Cooper is a 78 y.o. male returns for annual f/u of UC.  History of left-sided ulcerative colitis initially diagnosed in 2008.  He establish GI care with Dr. Bridgett Camps in 2013 for medically refractory UC.  He tried and failed mesalamine  and Remicade prior to 2013.  Previous consultations at [facility] and [facility].  Since 2013 he has been in clinical remission on mercaptopurine  and sulfasalazine .  Is requesting medication refill.  He is doing very well on this treatment.  Denies abdominal pain, rectal bleeding, diarrhea, weight loss, rashes, joint pains, or eye symptoms.  No GI concerns today.  He receives routine health maintenance care through his PCP Dr. Bernetta Brilliant.  Also sees dermatologist annually for skin exams.  05/2022 Last Colonoscopy by Dr. Bridgett Camps: 7 mm polyp removed from cecum, 4 mm polyp removed from sigmoid colon.  Pandiverticulosis, small internal hemorrhoids.  No active colitis.  Normal colon mucosa.  Colitis in remission.  3-year repeat.  04/2018 Colonoscopy by Dr. Bridgett Camps:  5 polyps, the largest 10 mm.  Pathology showed 2 of the polyps were tubular adenomas and 3 were hyperplastic.  He had normal appearing colonic mucosa throughout and multiple diverticuli in the descending and sigmoid colon.    He has been maintained on mercaptopurine  100 mg daily, sulfasalazine  2000 mg 3 times daily, and folic acid  1 mg daily.  Denies adverse side effects.  No recent labs.  He reports having labs through his PCP annually at [facility], results unavailable.  Last labs 1 year ago.  Current Outpatient Medications  Medication Sig Dispense Refill   albuterol  (VENTOLIN  HFA) 108 (90 Base) MCG/ACT inhaler Inhale 1-2 puffs into the  lungs every 6 (six) hours as needed. 8 g 3   diltiazem  (CARDIZEM  CD) 180 MG 24 hr capsule Take 1 capsule (180 mg total) by mouth daily. 90 capsule 3   ezetimibe (ZETIA) 10 MG tablet Take 10 mg by mouth daily.     fish oil-omega-3 fatty acids 1000 MG capsule Take 2 g by mouth daily.     Fluticasone -Umeclidin-Vilant (TRELEGY ELLIPTA ) 100-62.5-25 MCG/ACT AEPB Inhale 1 puff into the lungs daily. 1 each 11   losartan -hydrochlorothiazide  (HYZAAR) 50-12.5 MG tablet Take 1 tablet by mouth daily. 90 tablet 3   Multiple Vitamins-Minerals (PRESERVISION AREDS 2) CAPS take 1 po BID Oral     pravastatin (PRAVACHOL) 40 MG tablet Take 40 mg by mouth daily.     tadalafil (CIALIS) 5 MG tablet Take 5 mg by mouth at bedtime.     tobramycin (TOBREX) 0.3 % ophthalmic solution Place 1 drop into both eyes in the morning, at noon, in the evening, and at bedtime. Once a month     vitamin B-12 (CYANOCOBALAMIN) 250 MCG tablet Take 1,000 mcg by mouth daily.     Vitamin D, Cholecalciferol, 25 MCG (1000 UT) CAPS Take 1 capsule by mouth daily.     folic acid  (FOLVITE ) 1 MG tablet Take 1 tablet (1 mg total) by mouth daily. 90 tablet 3   mercaptopurine  (PURINETHOL ) 50 MG tablet Take 2 tablets (100 mg total) by mouth daily. Give on an empty stomach 1 hour before or 2 hours after meals. Caution: Chemotherapy. 180 tablet 3  sulfaSALAzine  (AZULFIDINE ) 500 MG tablet Take 2 tablets (1,000 mg total) by mouth 3 (three) times daily. 540 tablet 3   No current facility-administered medications for this visit.    Allergies as of 08/17/2023 - Review Complete 08/17/2023  Allergen Reaction Noted   Ibuprofen Other (See Comments)    Ozempic  (0.25 or 0.5 mg-dose) [semaglutide (0.25 or 0.5mg -dos)]  09/10/2021    Past Medical History:  Diagnosis Date   Allergic rhinitis    Allergy    Cataract    beginnings of cataracts    CHF (congestive heart failure) (HCC)    COPD (chronic obstructive pulmonary disease) (HCC)    Diverticulosis     GERD (gastroesophageal reflux disease)    no issues with CPAP-1999   HTN (hypertension)    Hyperlipidemia    Obesity    Obstructive sleep apnea on CPAP    Paroxysmal atrial fibrillation (HCC) 2005   past trauma and infection   Skin cancer    basel cell on nose,arm,leg   Sleep apnea    with CPAP   Ulcerative colitis    Ulcerative colitis, left sided (HCC)     Past Surgical History:  Procedure Laterality Date   BACK SURGERY  2005   BELPHAROPTOSIS REPAIR     bilateral   COLONOSCOPY  2013   Dr. Bridgett Camps    FETAL SURGERY FOR CONGENITAL HERNIA  2001   infection in chest that had to be cleaned out     Staph Infection   POLYPECTOMY     SKIN CANCER EXCISION     TONSILLECTOMY     tubular adenoma  2013   UMBILICAL HERNIA REPAIR     VASECTOMY      Review of Systems:    All systems reviewed and negative except where noted in HPI.   Physical Exam:  BP 126/60   Pulse 78   Ht 5' 11.5" (1.816 m)   Wt 234 lb 6 oz (106.3 kg)   SpO2 96%   BMI 32.23 kg/m  No LMP for male patient.  General: Well-nourished, well-developed in no acute distress.  Lungs: Clear to auscultation bilaterally. Non-labored. Heart: Regular rate and rhythm, no murmurs rubs or gallops.  Abdomen: Bowel sounds are normal; Abdomen is Soft; No hepatosplenomegaly, masses or hernias;  No Abdominal Tenderness; No guarding or rebound tenderness. Neuro: Alert and oriented x 3.  Grossly intact.  Psych: Alert and cooperative, normal mood and affect.   Imaging Studies: No results found.  Assessment and Plan:   Ricardo Cooper is a 78 y.o. y/o male returns for annual follow-up of ulcerative colitis in clinical remission.  Needs medication refill.  Doing well.  Colonoscopy is up-to-date.  1.  Ulcerative colitis, in clinical remission - Labs CBC, CMP, CRP, Folate, B12  - Refilled mercaptopurine  50 mg 2 tablets once daily every morning, 90-day, 3 refills. - Refilled sulfasalazine  500 mg 2 tablets 3 times daily, 90-day,  3 refills. - Refilled folic acid  1 g 1 tablet once daily, 90-day, 3 refills. - Continue routine health maintenance through PCP Dr. Bernetta Brilliant. - Continue annual dermatology exams.  2.  History of adenomatous colon polyps - Colonoscopy every 3 years for surveillance, next due 05/2025.  Brigitte Canard, PA-C  Follow up in 1 year or sooner if he develops any GI symptoms.

## 2023-08-17 NOTE — Telephone Encounter (Signed)
 CMN Fax  'third request" rec'd from [facility]. Waiting on Ms. Parrett to sign and ret.

## 2023-08-17 NOTE — Patient Instructions (Addendum)
 Your provider has requested that you go to the basement level for lab work before leaving today. Press "B" on the elevator. The lab is located at the first door on the left as you exit the elevator.   _______________________________________________________  If your blood pressure at your visit was 140/90 or greater, please contact your primary care physician to follow up on this.  _______________________________________________________  If you are age 77 or older, your body mass index should be between 23-30. Your Body mass index is 32.23 kg/m. If this is out of the aforementioned range listed, please consider follow up with your Primary Care Provider.  If you are age 2 or younger, your body mass index should be between 19-25. Your Body mass index is 32.23 kg/m. If this is out of the aformentioned range listed, please consider follow up with your Primary Care Provider.   ________________________________________________________  The [facility] providers would like to encourage you to use MYCHART to communicate with providers for non-urgent requests or questions.  Due to long hold times on the telephone, sending your provider a message by [facility] may be a faster and more efficient way to get a response.  Please allow 48 business hours for a response.  Please remember that this is for non-urgent requests.  _______________________________________________________    It was a pleasure to see you today!  Thank you for trusting me with your gastrointestinal care!

## 2023-08-18 NOTE — Telephone Encounter (Signed)
 Order signed and faxed successfully 08/18/23

## 2023-08-31 DIAGNOSIS — R972 Elevated prostate specific antigen [PSA]: Secondary | ICD-10-CM | POA: Diagnosis not present

## 2023-08-31 DIAGNOSIS — R3912 Poor urinary stream: Secondary | ICD-10-CM | POA: Diagnosis not present

## 2023-08-31 DIAGNOSIS — N401 Enlarged prostate with lower urinary tract symptoms: Secondary | ICD-10-CM | POA: Diagnosis not present

## 2023-09-05 ENCOUNTER — Ambulatory Visit (HOSPITAL_BASED_OUTPATIENT_CLINIC_OR_DEPARTMENT_OTHER): Payer: Self-pay | Admitting: Adult Health

## 2023-09-05 ENCOUNTER — Ambulatory Visit (HOSPITAL_BASED_OUTPATIENT_CLINIC_OR_DEPARTMENT_OTHER): Payer: Self-pay | Admitting: Emergency Medicine

## 2023-09-05 ENCOUNTER — Encounter (HOSPITAL_BASED_OUTPATIENT_CLINIC_OR_DEPARTMENT_OTHER): Payer: Self-pay | Admitting: Adult Health

## 2023-09-05 VITALS — BP 124/78 | HR 77 | Ht 71.5 in | Wt 233.8 lb

## 2023-09-05 DIAGNOSIS — Z87891 Personal history of nicotine dependence: Secondary | ICD-10-CM

## 2023-09-05 DIAGNOSIS — Z9981 Dependence on supplemental oxygen: Secondary | ICD-10-CM

## 2023-09-05 DIAGNOSIS — J449 Chronic obstructive pulmonary disease, unspecified: Secondary | ICD-10-CM | POA: Diagnosis not present

## 2023-09-05 DIAGNOSIS — G4733 Obstructive sleep apnea (adult) (pediatric): Secondary | ICD-10-CM

## 2023-09-05 LAB — PULMONARY FUNCTION TEST
DL/VA % pred: 123 %
DL/VA: 4.87 ml/min/mmHg/L
DLCO cor % pred: 97 %
DLCO cor: 24.99 ml/min/mmHg
DLCO unc % pred: 100 %
DLCO unc: 25.8 ml/min/mmHg
FEF 25-75 Post: 1.46 L/s
FEF 25-75 Pre: 1.2 L/s
FEF2575-%Change-Post: 21 %
FEF2575-%Pred-Post: 64 %
FEF2575-%Pred-Pre: 53 %
FEV1-%Change-Post: 4 %
FEV1-%Pred-Post: 74 %
FEV1-%Pred-Pre: 70 %
FEV1-Post: 2.34 L
FEV1-Pre: 2.24 L
FEV1FVC-%Change-Post: 2 %
FEV1FVC-%Pred-Pre: 90 %
FEV6-%Change-Post: 2 %
FEV6-%Pred-Post: 84 %
FEV6-%Pred-Pre: 82 %
FEV6-Post: 3.45 L
FEV6-Pre: 3.38 L
FEV6FVC-%Change-Post: 0 %
FEV6FVC-%Pred-Post: 105 %
FEV6FVC-%Pred-Pre: 104 %
FVC-%Change-Post: 2 %
FVC-%Pred-Post: 80 %
FVC-%Pred-Pre: 78 %
FVC-Post: 3.5 L
FVC-Pre: 3.43 L
Post FEV1/FVC ratio: 67 %
Post FEV6/FVC ratio: 99 %
Pre FEV1/FVC ratio: 65 %
Pre FEV6/FVC Ratio: 98 %
RV % pred: 118 %
RV: 3.12 L
TLC % pred: 92 %
TLC: 6.71 L

## 2023-09-05 NOTE — Progress Notes (Signed)
 @Patient  ID: Ricardo Cooper, male    DOB: 18-Apr-1946, 79 y.o.   MRN: [id number]  Chief Complaint  Patient presents with   Follow-up    Referring provider: Drema Genta, NP  HPI: 78 year old male former smoker followed for COPD, obstructive sleep apnea, lung nodules (stable on serial CT) Medical history significant for ulcerative colitis and atrial fibrillation  TEST/EVENTS :  NPSG 11/21/97- AHI 36/ hr, desaturation to 85%, body weight 215 lbs    CT chest November 2021 no evidence of acute process, no new or suspicious nodules.  Left upper and right upper lung nodules have resolved.  Unchanged 7.5 mm left upper lobe lung nodule unchanged since 2015 consistent with a benign etiology   Chest xray 12/2022 Clear lungs, stable scarring at the lung apices right greater than left  09/05/2023 Follow up ; COPD and OSA  Patient presents for 79-month follow-up.  Patient has underlying sleep apnea.  He remains on CPAP therapy.  Patient says he wears his CPAP every single night.  Cannot sleep without it.  Feels that he benefits from CPAP with decreased daytime sleepiness.  Download shows excellent compliance with 100% usage.  Daily average usage at 8 hours.  Patient is on auto CPAP 8 to 15 cm H2O.  AHI is 1.1/hour.  Patient has underlying sleep apnea.  He remains on Trelegy inhaler.  Says overall his breathing is doing well.  He denies any flare of cough or wheezing.  He completed pulmonary function testing today that showed mild to moderate COPD with an FEV1 at 74%, ratio 67, FVC 80%, no significant bronchodilator response, DLCO 100%.   Allergies  Allergen Reactions   Ibuprofen Other (See Comments)    REACTION: causes colitis flare Pt was told not to take Ibuprofen d/t reaction   Ozempic  (0.25 Or 0.5 Mg-Dose) [Semaglutide (0.25 Or 0.5mg -Dos)]     nausea    Immunization History  Administered Date(s) Administered   Influenza Split 01/14/2011, 01/14/2012, 01/24/2013, 01/24/2014, 01/24/2017    Influenza Whole 12/25/2009   Influenza,inj,Quad PF,6+ Mos 12/28/2014   Influenza,inj,Quad PF,6-35 Mos 12/26/2018   Influenza-Unspecified 12/30/2015, 01/14/2018   PFIZER(Purple Top)SARS-COV-2 Vaccination 05/24/2019   Pneumococcal Conjugate-13 10/24/2013   Pneumococcal Polysaccharide-23 02/01/2008, 11/24/2012   Zoster Recombinant(Shingrix) 02/22/2018, 05/02/2018    Past Medical History:  Diagnosis Date   Allergic rhinitis    Allergy    Cataract    beginnings of cataracts    CHF (congestive heart failure) (HCC)    COPD (chronic obstructive pulmonary disease) (HCC)    Diverticulosis    GERD (gastroesophageal reflux disease)    no issues with CPAP-1999   HTN (hypertension)    Hyperlipidemia    Obesity    Obstructive sleep apnea on CPAP    Paroxysmal atrial fibrillation (HCC) 2005   past trauma and infection   Skin cancer    basel cell on nose,arm,leg   Sleep apnea    with CPAP   Ulcerative colitis    Ulcerative colitis, left sided (HCC)     Tobacco History: Social History   Tobacco Use  Smoking Status Former   Current packs/day: 0.00   Average packs/day: 1 pack/day for 35.0 years (35.0 ttl pk-yrs)   Types: Cigarettes   Start date: 04/26/1966   Quit date: 04/26/2001   Years since quitting: 22.3  Smokeless Tobacco Former   Types: Chew   Quit date: 06/25/1991  Tobacco Comments   quit in 2005   Counseling given: Not Answered Tobacco comments: quit in 2005  Outpatient Medications Prior to Visit  Medication Sig Dispense Refill   albuterol  (VENTOLIN  HFA) 108 (90 Base) MCG/ACT inhaler Inhale 1-2 puffs into the lungs every 6 (six) hours as needed. 8 g 3   diltiazem  (CARDIZEM  CD) 180 MG 24 hr capsule Take 1 capsule (180 mg total) by mouth daily. 90 capsule 3   ezetimibe (ZETIA) 10 MG tablet Take 10 mg by mouth daily.     fish oil-omega-3 fatty acids 1000 MG capsule Take 2 g by mouth daily.     Fluticasone -Umeclidin-Vilant (TRELEGY ELLIPTA ) 100-62.5-25 MCG/ACT AEPB Inhale  1 puff into the lungs daily. 1 each 11   folic acid  (FOLVITE ) 1 MG tablet Take 1 tablet (1 mg total) by mouth daily. 90 tablet 3   losartan -hydrochlorothiazide  (HYZAAR) 50-12.5 MG tablet Take 1 tablet by mouth daily. 90 tablet 3   mercaptopurine  (PURINETHOL ) 50 MG tablet Take 2 tablets (100 mg total) by mouth daily. Give on an empty stomach 1 hour before or 2 hours after meals. Caution: Chemotherapy. 180 tablet 3   Multiple Vitamins-Minerals (PRESERVISION AREDS 2) CAPS take 1 po BID Oral     pravastatin (PRAVACHOL) 40 MG tablet Take 40 mg by mouth daily.     sulfaSALAzine  (AZULFIDINE ) 500 MG tablet Take 2 tablets (1,000 mg total) by mouth 3 (three) times daily. 540 tablet 3   tadalafil (CIALIS) 5 MG tablet Take 5 mg by mouth at bedtime.     tobramycin (TOBREX) 0.3 % ophthalmic solution Place 1 drop into both eyes in the morning, at noon, in the evening, and at bedtime. Once a month     vitamin B-12 (CYANOCOBALAMIN ) 250 MCG tablet Take 1,000 mcg by mouth daily.     Vitamin D, Cholecalciferol, 25 MCG (1000 UT) CAPS Take 1 capsule by mouth daily.     No facility-administered medications prior to visit.     Review of Systems:   Constitutional:   No  weight loss, night sweats,  Fevers, chills,+ fatigue, or  lassitude.  HEENT:   No headaches,  Difficulty swallowing,  Tooth/dental problems, or  Sore throat,                No sneezing, itching, ear ache, nasal congestion, post nasal drip,   CV:  No chest pain,  Orthopnea, PND, swelling in lower extremities, anasarca, dizziness, palpitations, syncope.   GI  No heartburn, indigestion, abdominal pain, nausea, vomiting, diarrhea, change in bowel habits, loss of appetite, bloody stools.   Resp:   No chest wall deformity  Skin: no rash or lesions.  GU: no dysuria, change in color of urine, no urgency or frequency.  No flank pain, no hematuria   MS:  No joint pain or swelling.  No decreased range of motion.  No back pain.    Physical Exam  BP  124/78   Pulse 77   Ht 5' 11.5" (1.816 m)   Wt 233 lb 12.8 oz (106.1 kg)   BMI 32.15 kg/m   GEN: A/Ox3; pleasant , NAD, well nourished    HEENT:  Clay/AT,  NOSE-clear, THROAT-clear, no lesions, no postnasal drip or exudate noted.   NECK:  Supple w/ fair ROM; no JVD; normal carotid impulses w/o bruits; no thyromegaly or nodules palpated; no lymphadenopathy.    RESP  Clear  P & A; w/o, wheezes/ rales/ or rhonchi. no accessory muscle use, no dullness to percussion  CARD:  RRR, no m/r/g, no peripheral edema, pulses intact, no cyanosis or clubbing.  GI:  Soft & nt; nml bowel sounds; no organomegaly or masses detected.   Musco: Warm bil, no deformities or joint swelling noted.   Neuro: alert, no focal deficits noted.    Skin: Warm, no lesions or rashes    Lab Results:  CBC    BNP No results found for: "BNP"  ProBNP No results found for: "PROBNP"  Imaging: No results found.  Administration History     None          Latest Ref Rng & Units 09/05/2023    2:23 PM  PFT Results  FVC-Pre L 3.43  P  FVC-Predicted Pre % 78  P  FVC-Post L 3.50  P  FVC-Predicted Post % 80  P  Pre FEV1/FVC % % 65  P  Post FEV1/FCV % % 67  P  FEV1-Pre L 2.24  P  FEV1-Predicted Pre % 70  P  FEV1-Post L 2.34  P  DLCO uncorrected ml/min/mmHg 25.80  P  DLCO UNC% % 100  P  DLCO corrected ml/min/mmHg 24.99  P  DLCO COR %Predicted % 97  P  DLVA Predicted % 123  P  TLC L 6.71  P  TLC % Predicted % 92  P  RV % Predicted % 118  P    P Preliminary result    No results found for: "NITRICOXIDE"      Assessment & Plan:   COPD GOLD II Stable COPD - PFTs reviewed . Continue on triple therapy  Plan  Patient Instructions  Continue on Trelegy 1 puff daily, rinse after use Albuterol  inhaler as needed Activity as tolerated  Continue on CPAP at bedtime Work on healthy weight Do not drive if sleepy Follow-up with Dr. Baldwin Levee in 6 months and As needed      Obstructive sleep  apnea Excellent control and compliance on CPAP   Plan  Patient Instructions  Continue on Trelegy 1 puff daily, rinse after use Albuterol  inhaler as needed Activity as tolerated  Continue on CPAP at bedtime Work on healthy weight Do not drive if sleepy Follow-up with Dr. Baldwin Levee in 6 months and As needed        Roena Clark, NP 09/05/2023

## 2023-09-05 NOTE — Progress Notes (Signed)
 Full pft performed today.

## 2023-09-05 NOTE — Patient Instructions (Signed)
 Continue on Trelegy 1 puff daily, rinse after use Albuterol  inhaler as needed Activity as tolerated  Continue on CPAP at bedtime Work on healthy weight Do not drive if sleepy Follow-up with Dr. Baldwin Levee in 6 months and As needed

## 2023-09-05 NOTE — Assessment & Plan Note (Signed)
 Stable COPD - PFTs reviewed . Continue on triple therapy  Plan  Patient Instructions  Continue on Trelegy 1 puff daily, rinse after use Albuterol  inhaler as needed Activity as tolerated  Continue on CPAP at bedtime Work on healthy weight Do not drive if sleepy Follow-up with Dr. Baldwin Levee in 6 months and As needed

## 2023-09-05 NOTE — Assessment & Plan Note (Signed)
 Excellent control and compliance on CPAP   Plan  Patient Instructions  Continue on Trelegy 1 puff daily, rinse after use Albuterol  inhaler as needed Activity as tolerated  Continue on CPAP at bedtime Work on healthy weight Do not drive if sleepy Follow-up with Dr. Baldwin Levee in 6 months and As needed

## 2023-09-05 NOTE — Patient Instructions (Signed)
 Full pft performed today.

## 2023-09-15 NOTE — Progress Notes (Signed)
 Addendum: Reviewed and agree with assessment and management plan. Asha Grumbine, Carie Caddy, MD

## 2023-10-03 DIAGNOSIS — D0471 Carcinoma in situ of skin of right lower limb, including hip: Secondary | ICD-10-CM | POA: Diagnosis not present

## 2023-10-03 DIAGNOSIS — L812 Freckles: Secondary | ICD-10-CM | POA: Diagnosis not present

## 2023-10-03 DIAGNOSIS — L570 Actinic keratosis: Secondary | ICD-10-CM | POA: Diagnosis not present

## 2023-10-03 DIAGNOSIS — D1801 Hemangioma of skin and subcutaneous tissue: Secondary | ICD-10-CM | POA: Diagnosis not present

## 2023-10-03 DIAGNOSIS — D485 Neoplasm of uncertain behavior of skin: Secondary | ICD-10-CM | POA: Diagnosis not present

## 2023-10-03 DIAGNOSIS — L820 Inflamed seborrheic keratosis: Secondary | ICD-10-CM | POA: Diagnosis not present

## 2023-10-03 DIAGNOSIS — L821 Other seborrheic keratosis: Secondary | ICD-10-CM | POA: Diagnosis not present

## 2023-10-03 DIAGNOSIS — Z85828 Personal history of other malignant neoplasm of skin: Secondary | ICD-10-CM | POA: Diagnosis not present

## 2023-10-04 DIAGNOSIS — H2513 Age-related nuclear cataract, bilateral: Secondary | ICD-10-CM | POA: Diagnosis not present

## 2023-10-04 DIAGNOSIS — H43812 Vitreous degeneration, left eye: Secondary | ICD-10-CM | POA: Diagnosis not present

## 2023-10-04 DIAGNOSIS — H43823 Vitreomacular adhesion, bilateral: Secondary | ICD-10-CM | POA: Diagnosis not present

## 2023-10-04 DIAGNOSIS — H353211 Exudative age-related macular degeneration, right eye, with active choroidal neovascularization: Secondary | ICD-10-CM | POA: Diagnosis not present

## 2023-10-04 DIAGNOSIS — H353121 Nonexudative age-related macular degeneration, left eye, early dry stage: Secondary | ICD-10-CM | POA: Diagnosis not present

## 2023-10-25 DIAGNOSIS — G4733 Obstructive sleep apnea (adult) (pediatric): Secondary | ICD-10-CM | POA: Diagnosis not present

## 2023-11-08 DIAGNOSIS — G20A1 Parkinson's disease without dyskinesia, without mention of fluctuations: Secondary | ICD-10-CM | POA: Diagnosis not present

## 2023-11-11 NOTE — Progress Notes (Signed)
 Assessment/Plan:   Parkinsonism.  I suspect that this does represent idiopathic Parkinson's disease.  atypical state cannot be ruled out.  -We discussed the diagnosis as well as pathophysiology of the disease.  We discussed treatment options as well as prognostic indicators.  Patient education was provided.  -We discussed that it used to be thought that levodopa  would increase risk of melanoma but now it is believed that Parkinsons itself likely increases risk of melanoma. he is to get regular skin checks.  He is doing that with Dr. Bard Molt given his history of squamous and basal cell carcinoma  -Greater than 50% of the 60 minute visit was spent in counseling answering questions and talking about what to expect now as well as in the future.  We talked about medication options as well as potential future surgical options.  We talked about safety in the home.  -We decided to add carbidopa /levodopa  25/100.  1/2 tab tid x 1 wk, then 1/2 in am & noon & 1 at night for a week, then 1/2 in am &1 at noon &night for a week, then 1 po tid.  Risks, benefits, side effects and alternative therapies were discussed.  The opportunity to ask questions was given and they were answered to the best of my ability.  The patient expressed understanding and willingness to follow the outlined treatment protocols.  -I will refer the patient [facility], for PT and ST.  We talked about the importance of safe, cardiovascular exercise in Parkinson's disease.  -We discussed community resources in the area including patient support groups and community exercise programs for PD and pt education was provided to the patient.   -reduce alcohol  to no more than 2 days/week   Subjective:   Ricardo Cooper was seen today in the movement disorders clinic for neurologic consultation at the request of [name]., MD.  Pt with wife who supplements hx.  The consultation is for  the evaluation of left hand rest tremor for 3 months and to rule out Parkinson's disease.  Notes from primary care indicate that patient has had bradykinesia with shuffling gait since 2023, now with more recent tremor.   Specific Symptoms:  Tremor: Yes.  , L hand (he is R handed); mostly tremor with ambulation or standing still or laying down in the bed; no tremor of the L leg or tremor on the R side Family hx of similar:  No. Voice: its always been soft but getting softer -  pt thinks that the trilegy makes voice more hoarse Sleep:   Vivid Dreams:  No.  Acting out dreams:  No. Wet Pillows: No. Postural symptoms:  Yes.   Its not been quite right since I broke my back 20 years ago but its getting worse  Falls?  No. Bradykinesia symptoms: shuffling gait, slow movements, and difficulty getting out of a chair Loss of smell:  Yes.   Loss of taste:  No. Urinary Incontinence:  No. Difficulty Swallowing:  No., no trouble with food but I have always had trouble with choking on my spit Handwriting, micrographia: No. Trouble with ADL's:  No.  Trouble buttoning clothing: Yes.   Depression:  No. But admits to anxiety Memory changes:  pt thinks has short term memory troubles but wife thinks its okay Hallucinations:  No.  visual distortions: No. N/V:  No. Lightheaded:  No./rarely  Syncope: No. Diplopia:  No. (Does have macular degeneration) Dyskinesia:  No.  Prior exposure to reglan/antipsychotics: No.  Neuroimaging of the brain has not previously been performed.    PREVIOUS MEDICATIONS: none to date  ALLERGIES:   Allergies  Allergen Reactions   Ibuprofen Other (See Comments)    REACTION: causes colitis flare Pt was told not to take Ibuprofen d/t reaction   Ozempic  (0.25 Or 0.5 Mg-Dose) [Semaglutide (0.25 Or 0.5mg -Dos)]     nausea    CURRENT MEDICATIONS:  Current Meds  Medication Sig   albuterol  (VENTOLIN  HFA) 108 (90 Base) MCG/ACT inhaler Inhale 1-2 puffs into the lungs every 6  (six) hours as needed.   diltiazem  (CARDIZEM  CD) 180 MG 24 hr capsule Take 1 capsule (180 mg total) by mouth daily.   ezetimibe (ZETIA) 10 MG tablet Take 10 mg by mouth daily.   fish oil-omega-3 fatty acids 1000 MG capsule Take 2 g by mouth daily.   Fluticasone -Umeclidin-Vilant (TRELEGY ELLIPTA ) 100-62.5-25 MCG/ACT AEPB Inhale 1 puff into the lungs daily.   folic acid  (FOLVITE ) 1 MG tablet Take 1 tablet (1 mg total) by mouth daily.   losartan -hydrochlorothiazide  (HYZAAR) 50-12.5 MG tablet Take 1 tablet by mouth daily.   mercaptopurine  (PURINETHOL ) 50 MG tablet Take 2 tablets (100 mg total) by mouth daily. Give on an empty stomach 1 hour before or 2 hours after meals. Caution: Chemotherapy.   Multiple Vitamins-Minerals (PRESERVISION AREDS 2) CAPS take 1 po BID Oral   pravastatin (PRAVACHOL) 40 MG tablet Take 40 mg by mouth daily.   silodosin (RAPAFLO) 8 MG CAPS capsule Take 8 mg by mouth daily with breakfast. (Patient taking differently: Take 8 mg by mouth at bedtime.)   sulfaSALAzine  (AZULFIDINE ) 500 MG tablet Take 2 tablets (1,000 mg total) by mouth 3 (three) times daily.   tadalafil (CIALIS) 5 MG tablet Take 5 mg by mouth at bedtime.   tobramycin (TOBREX) 0.3 % ophthalmic solution Place 1 drop into both eyes in the morning, at noon, in the evening, and at bedtime. Once a month   vitamin B-12 (CYANOCOBALAMIN ) 250 MCG tablet Take 1,000 mcg by mouth daily.   Vitamin D, Cholecalciferol, 25 MCG (1000 UT) CAPS Take 1 capsule by mouth daily.     Objective:   VITALS:   Vitals:   11/15/23 0953  BP: 116/70  Pulse: 73  SpO2: 94%  Weight: 233 lb 6.4 oz (105.9 kg)  Height: 5' 11 (1.803 m)    GEN:  The patient appears stated age and is in NAD. HEENT:  Normocephalic, atraumatic.  The mucous membranes are moist. The superficial temporal arteries are without ropiness or tenderness. CV:  RRR Lungs:  CTAB Neck/HEME:  There are no carotid bruits bilaterally.  Neurological  examination:  Orientation: The patient is alert and oriented x3.  Cranial nerves: There is good facial symmetry. Extraocular muscles are intact. The visual fields are full to confrontational testing. The speech is fluent and clear. Soft palate rises symmetrically and there is no tongue deviation. Hearing is intact to conversational tone. Sensation: Sensation is intact to light and pinprick throughout (facial, trunk, extremities). Vibration is intact at the bilateral big toe, albeit decreased distally. There is no extinction with double simultaneous stimulation. There is no sensory dermatomal level identified. Motor: Strength is 5/5 in the bilateral upper and lower extremities.   Shoulder shrug is equal and symmetric.  There is no pronator drift. Deep tendon reflexes: Deep tendon reflexes are 0-1/4 at the bilateral biceps, triceps, brachioradialis, patella and achilles. Plantar responses are downgoing bilaterally.  Movement examination: Tone: There is mild  increased tone in the RUE.  Abnormal movements: there is RUE tremor that increases with distraction Coordination:  There is  decremation with RAM's, with finger taps and the action of turning in a lightbulb Gait and Station: The patient has no difficulty arising out of a deep-seated chair without the use of the hands. The patient's stride length is good with decreased arm swing bilaterally.  There is RUE rest tremor with ambulation.  The patient has a neg pull test.     I have reviewed and interpreted the following labs independently   Chemistry      Component Value Date/Time   NA 141 08/17/2023 1528   K 4.0 Hemolysis seen.SABRA 08/17/2023 1528   CL 103 08/17/2023 1528   CO2 31 08/17/2023 1528   BUN 16 08/17/2023 1528   CREATININE 0.93 08/17/2023 1528      Component Value Date/Time   CALCIUM 9.3 08/17/2023 1528   ALKPHOS 46 08/17/2023 1528   AST 27 08/17/2023 1528   ALT 43 08/17/2023 1528   BILITOT 0.7 08/17/2023 1528      No results  found for: TSH Lab Results  Component Value Date   WBC 3.9 (L) 08/17/2023   HGB 15.8 08/17/2023   HCT 47.5 08/17/2023   MCV 104.2 (H) 08/17/2023   PLT 164.0 08/17/2023     Total time spent on today's visit was 60 minutes, including both face-to-face time and nonface-to-face time.  Time included that spent on review of records (prior notes available to me/labs/imaging if pertinent), discussing treatment and goals, answering patient's questions and coordinating care.  Cc:  [name]., MD

## 2023-11-15 ENCOUNTER — Ambulatory Visit: Admitting: Neurology

## 2023-11-15 ENCOUNTER — Encounter: Payer: Self-pay | Admitting: Neurology

## 2023-11-15 VITALS — BP 116/70 | HR 73 | Ht 71.0 in | Wt 233.4 lb

## 2023-11-15 DIAGNOSIS — G20A1 Parkinson's disease without dyskinesia, without mention of fluctuations: Secondary | ICD-10-CM

## 2023-11-15 MED ORDER — CARBIDOPA-LEVODOPA 25-100 MG PO TABS
1.0000 | ORAL_TABLET | Freq: Three times a day (TID) | ORAL | 1 refills | Status: DC
Start: 2023-11-15 — End: 2024-03-15

## 2023-11-15 NOTE — Patient Instructions (Addendum)
 Start Carbidopa  Levodopa  as follows: Take 1/2 tablet three times daily, at least 30 minutes before meals (approximately 8am/noon/4pm), for one week Then take 1/2 tablet in the morning, 1/2 tablet at noon, 1 tablet in the evening, at least 30 minutes before meals, for one week Then take 1/2 tablet in the morning, 1 tablet at noon, 1 tablet in the evening, at least 30 minutes before meals, for one week Then take 1 tablet three times daily at 8am/noon/4pm, at least 30 minutes before meals   As a reminder, carbidopa /levodopa  can be taken at the same time as a carbohydrate, but we like to have you take your pill either 30 minutes before a protein source or 1 hour after as protein can interfere with carbidopa /levodopa  absorption.   SAVE THE DATE!  We are planning a Parkinsons Disease educational symposium at [facility], [address] [place], [place], [place] [address] on September 19.  We will have a movement disorder physician expert from [employer] coming to speak and a caregiver speaker.  We will have a panel of experts that will show you who you may need on your team of people on your journey with Parkinsons.  I hope to see you there!  Use this QR code to register by scanning it with the camera app on your phone:      Need more help with registration?  Contact [email or web address]

## 2023-11-27 ENCOUNTER — Encounter: Payer: Self-pay | Admitting: Neurology

## 2023-11-30 ENCOUNTER — Other Ambulatory Visit (HOSPITAL_COMMUNITY): Payer: Self-pay | Admitting: Physician Assistant

## 2023-12-01 NOTE — Therapy (Signed)
 OUTPATIENT PHYSICAL THERAPY NEURO EVALUATION   Patient Name: Ricardo Cooper MRN: [id number] DOB:[birth year], 78 y.o., male Today's Date: 12/05/2023   PCP: [name]., MD  REFERRING PROVIDER: Evonnie Asberry RAMAN, DO    END OF SESSION:  PT End of Session - 12/05/23 1023     Visit Number 1    Number of Visits 9    Date for PT Re-Evaluation 02/03/24   due to delay in scheduling   Authorization Type [employer]    PT Start Time 1021    PT Stop Time 1100    PT Time Calculation (min) 39 min    Equipment Utilized During Treatment Gait belt    Activity Tolerance Patient tolerated treatment well    Behavior During Therapy WFL for tasks assessed/performed          Past Medical History:  Diagnosis Date   Allergic rhinitis    Allergy    Cataract    beginnings of cataracts    CHF (congestive heart failure) (HCC)    COPD (chronic obstructive pulmonary disease) (HCC)    Diverticulosis    GERD (gastroesophageal reflux disease)    no issues with CPAP-1999   HTN (hypertension)    Hyperlipidemia    Obesity    Obstructive sleep apnea on CPAP    Paroxysmal atrial fibrillation (HCC) 2005   past trauma and infection   Skin cancer    basel cell on nose,arm,leg   Sleep apnea    with CPAP   Ulcerative colitis    Ulcerative colitis, left sided (HCC)    Past Surgical History:  Procedure Laterality Date   BACK SURGERY  2005   BELPHAROPTOSIS REPAIR     bilateral   COLONOSCOPY  2013   Dr. Albertus    FETAL SURGERY FOR CONGENITAL HERNIA  2001   infection in chest that had to be cleaned out     Staph Infection   POLYPECTOMY     SKIN CANCER EXCISION     squamous and basal cell   TONSILLECTOMY     tubular adenoma  2013   UMBILICAL HERNIA REPAIR     VASECTOMY     Patient Active Problem List   Diagnosis Date Noted   History of colonic polyps 04/28/2022   Obesity (BMI 30-39.9) 07/02/2021   CAP (community acquired pneumonia) 03/18/2021   Plantar fasciitis  01/18/2020   Pain due to onychomycosis of toenails of both feet 09/18/2019   Multiple lung nodules 06/07/2018   Influenza with respiratory manifestation 06/06/2015   Ulcerative colitis with complication (HCC) 05/03/2011   PURE HYPERCHOLESTEROLEMIA 03/23/2010   PRECORDIAL PAIN 03/23/2010   CHEST TIGHTNESS-PRESSURE-OTHER 03/18/2010   DYSPNEA 11/19/2008   HYPERLIPIDEMIA 02/01/2008   HYPERTENSION 02/01/2008   ATRIAL FIBRILLATION 02/01/2008   Allergic rhinitis 02/01/2008   COPD GOLD II 02/01/2008   Obstructive sleep apnea 02/01/2008    ONSET DATE: 11/15/2023  REFERRING DIAG: Evonnie Asberry RAMAN, DO   THERAPY DIAG:  Unsteadiness on feet  Other abnormalities of gait and mobility  Other symptoms and signs involving the nervous system  Abnormal posture  Rationale for Evaluation and Treatment: Rehabilitation  SUBJECTIVE:  SUBJECTIVE STATEMENT: Recently saw Dr. Evonnie and was diagnosed with PD. Has been taking the Carbidopa  Levodopa , starting 3 whole pills on Wednesdays. Has not had any changes. Per wife, he is walking better. Has tremor in L hand. Walking fast is challenging, notices that he really has slowed down in the last 2-3 years. Broke his back in 2005 and reports balance has felt off since then. No falls in the past 6 months. Had an almost fall almost tripping on something. Mostly doing yardwork for exercise, has a garden that he works in. Likes to be outside. Notes that he is not as flexible as he used to be. Pt reports he is R handed    Pt accompanied by: Spouse, Ricardo Cooper   PERTINENT HISTORY: PMH: Parkinsonism (recently diagnosed 10/2023), CHF, COPD, HTN, HLD, hx of skin cancer, hx of back injury in 2005  Per Dr. Evonnie: left hand rest tremor for 3 months. Notes from primary care indicate that patient has  had bradykinesia with shuffling gait since 2023, now with more recent tremor.   PAIN:  Are you having pain? No  PRECAUTIONS: None  FALLS: Has patient fallen in last 6 months? 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   COGNITION: Overall cognitive status: Within functional limits for tasks assessed   SENSATION: Pt denies numbness/tingling in legs   COORDINATION: Heel to shin: slightly slower with LLE   POSTURE: rounded shoulders   LOWER EXTREMITY MMT:    MMT Right Eval Left Eval  Hip flexion 5 5  Hip extension    Hip abduction    Hip adduction    Hip internal rotation    Hip external rotation    Knee flexion 5 5  Knee extension 5 4+  Ankle dorsiflexion 5 5  Ankle plantarflexion    Ankle inversion    Ankle eversion    (Blank rows = not tested)  All tested in sitting   BED MOBILITY:  Pt reports some difficulties, using his hand on the night stand to get out of bed, not as easy as it used to be   TRANSFERS: Sit to stand: Modified independence and SBA  Assistive device utilized: None     Stand to sit: Modified independence and SBA  Assistive device utilized: None       GAIT: Findings: Gait Characteristics: step through pattern, decreased arm swing- Right, decreased arm swing- Left, decreased stride length, and decreased trunk rotation, Distance walked: Clinic distances, Assistive device utilized:None, Level of assistance: Modified independence, and Comments: pt with incr LUE tremor during  gait   FUNCTIONAL TESTS:  5 times sit to stand: 15.9 seconds with no UE support  10 meter walk test: 10.7 seconds = 3.07 ft/sec    [facility] PT Assessment - 12/05/23 1052       Standardized Balance Assessment   Standardized Balance Assessment Timed Up and Go Test      Timed Up and Go Test   Normal TUG (seconds) 9.7    Manual TUG (seconds) 9.6    Cognitive TUG (  seconds) 10.7   attempted at 77 and backwards by 3, incorrect counting                                                                                                                                     TREATMENT DATE: 12/05/23  Self-Care: Educated on PT's role with PD and what PT will be addressing in regards to PD deficits and importance of PD specific exercise as well as aerobic exercise. Pt planning on attending [employer] boxing classes or cycling classes at the [facility] and may wait to start this until he finishes therapy  Discussed purpose of ST/OT in regards to PD and importance of addressing items before they may get challenging and staying proactive, pt reports he has always had a soft voice, but is willing to try OT/ST, will reach out to Dr. Evonnie for referral    PATIENT EDUCATION: Education details: Clinical findings, POC, See Self-Care  Person educated: Patient and Spouse Education method: Explanation Education comprehension: verbalized understanding  HOME EXERCISE PROGRAM: Will provide at future session.  GOALS: Goals reviewed with patient? Yes  SHORT TERM GOALS: ALL STGS = LTGS  LONG TERM GOALS: Target date: 01/02/2024  Pt will be independent with final HEP for PD specific deficits in order to build upon functional gains made in therapy.  Baseline:  Goal status: INITIAL  2.  miniBEST to be assessed with LTG written.  Baseline:  Goal status: INITIAL  3.  Pt will verbalize understanding of local Parkinson's disease resources, including options for continue community fitness.  Baseline:  Goal status:  INITIAL  4.  Pt will improve 5x sit<>stand to less than or equal to 13 sec to demonstrate improved functional strength and transfer efficiency.   Baseline: 15.9 seconds with no UE support Goal status: INITIAL   ASSESSMENT:  CLINICAL IMPRESSION: Patient is a 78 year old male referred to Neuro OPPT for Parkinsonism.   Pt's PMH is significant for: Parkinsonism (recently diagnosed 10/2023), CHF, COPD, HTN, HLD, hx of skin cancer, hx of back injury in 2005. The following deficits were present during the exam: bradykinesia, LUE tremor, impaired balance, gait abnormalities, decr functional strength, postural abnormalities. Based on 5x sit <> stand, pt is an incr risk for falls. Will perform further balance testing at next session with miniBEST. Pt would benefit from skilled PT to address these impairments and functional limitations to maximize functional mobility independence   OBJECTIVE IMPAIRMENTS: Abnormal gait, decreased activity tolerance, decreased balance, decreased coordination, decreased knowledge of condition, decreased mobility, difficulty walking, decreased strength, impaired flexibility, and postural dysfunction.   ACTIVITY LIMITATIONS: bed mobility and locomotion level  PARTICIPATION LIMITATIONS: community activity  PERSONAL FACTORS: Age, Behavior pattern, Past/current experiences, Time since onset of injury/illness/exacerbation, and 3+ comorbidities: Parkinsonism (recently diagnosed 10/2023), CHF, COPD, HTN, HLD, hx of skin cancer, hx of back injury in 2005 are also affecting patient's functional outcome.   REHAB POTENTIAL: Good  CLINICAL DECISION MAKING: Stable/uncomplicated  EVALUATION COMPLEXITY: Low  PLAN:  PT FREQUENCY: 2x/week  PT DURATION: 8 weeks - due to delay in scheduling, anticipate just 4 weeks   PLANNED INTERVENTIONS: 97164- PT Re-evaluation, 97110-Therapeutic exercises, 97530- Therapeutic activity, 97112- Neuromuscular re-education, 97535- Self Care, [id number]-  Manual therapy, Patient/Family education, Balance training, and Stair training  PLAN FOR NEXT SESSION: assess miniBEST and write goal. Initiate HEP for standing PWR moves. Work on high level balance tasks based on miniBEST and larger amplitude movements with gait   Did pt get orders for ST/OT?    Sheffield LOISE Senate, PT,DPT 12/05/2023, 12:05 PM

## 2023-12-05 ENCOUNTER — Encounter: Payer: Self-pay | Admitting: Physical Therapy

## 2023-12-05 ENCOUNTER — Ambulatory Visit: Attending: Neurology | Admitting: Physical Therapy

## 2023-12-05 DIAGNOSIS — R2681 Unsteadiness on feet: Secondary | ICD-10-CM | POA: Diagnosis not present

## 2023-12-05 DIAGNOSIS — G20A1 Parkinson's disease without dyskinesia, without mention of fluctuations: Secondary | ICD-10-CM | POA: Insufficient documentation

## 2023-12-05 DIAGNOSIS — R2689 Other abnormalities of gait and mobility: Secondary | ICD-10-CM | POA: Insufficient documentation

## 2023-12-05 DIAGNOSIS — R29818 Other symptoms and signs involving the nervous system: Secondary | ICD-10-CM | POA: Insufficient documentation

## 2023-12-05 DIAGNOSIS — R29898 Other symptoms and signs involving the musculoskeletal system: Secondary | ICD-10-CM | POA: Insufficient documentation

## 2023-12-05 DIAGNOSIS — R278 Other lack of coordination: Secondary | ICD-10-CM | POA: Insufficient documentation

## 2023-12-05 DIAGNOSIS — R293 Abnormal posture: Secondary | ICD-10-CM | POA: Diagnosis not present

## 2023-12-06 ENCOUNTER — Telehealth: Payer: Self-pay | Admitting: Physical Therapy

## 2023-12-06 DIAGNOSIS — H43823 Vitreomacular adhesion, bilateral: Secondary | ICD-10-CM | POA: Diagnosis not present

## 2023-12-06 DIAGNOSIS — H2513 Age-related nuclear cataract, bilateral: Secondary | ICD-10-CM | POA: Diagnosis not present

## 2023-12-06 DIAGNOSIS — H353211 Exudative age-related macular degeneration, right eye, with active choroidal neovascularization: Secondary | ICD-10-CM | POA: Diagnosis not present

## 2023-12-06 DIAGNOSIS — G20A1 Parkinson's disease without dyskinesia, without mention of fluctuations: Secondary | ICD-10-CM

## 2023-12-06 DIAGNOSIS — H43812 Vitreous degeneration, left eye: Secondary | ICD-10-CM | POA: Diagnosis not present

## 2023-12-06 DIAGNOSIS — R498 Other voice and resonance disorders: Secondary | ICD-10-CM

## 2023-12-06 DIAGNOSIS — H353121 Nonexudative age-related macular degeneration, left eye, early dry stage: Secondary | ICD-10-CM | POA: Diagnosis not present

## 2023-12-06 NOTE — Telephone Encounter (Signed)
 Dr. Evonnie,  Ricardo Cooper  was evaluated by PT on 12/05/2023.  The patient would benefit from OT and ST evaluation for LUE tremor and hypophonia.    If you agree, please place an order in [facility] workque in [facility] or fax the order to [phone].  Thank you,  [name], PT, DPT 12/06/23 10:30 AM    [facility] [address] [place], [place]  [address] Phone:  [phone] Fax:  [phone]

## 2023-12-08 ENCOUNTER — Ambulatory Visit: Payer: Self-pay | Admitting: Physical Therapy

## 2023-12-08 ENCOUNTER — Encounter: Payer: Self-pay | Admitting: Physical Therapy

## 2023-12-08 DIAGNOSIS — R2681 Unsteadiness on feet: Secondary | ICD-10-CM | POA: Diagnosis not present

## 2023-12-08 DIAGNOSIS — R2689 Other abnormalities of gait and mobility: Secondary | ICD-10-CM

## 2023-12-08 DIAGNOSIS — R29818 Other symptoms and signs involving the nervous system: Secondary | ICD-10-CM

## 2023-12-08 DIAGNOSIS — R293 Abnormal posture: Secondary | ICD-10-CM

## 2023-12-08 NOTE — Therapy (Signed)
 OUTPATIENT PHYSICAL THERAPY NEURO TREATMENT   Patient Name: Ricardo Cooper MRN: [id number] DOB:1945-09-07, 78 y.o., male Today's Date: 12/08/2023   PCP: [name]., MD  REFERRING PROVIDER: Evonnie Asberry RAMAN, DO    END OF SESSION:  PT End of Session - 12/08/23 1106     Visit Number 2    Number of Visits 9    Date for PT Re-Evaluation 02/03/24   due to delay in scheduling   Authorization Type [employer]    PT Start Time 1104    PT Stop Time 1142    PT Time Calculation (min) 38 min    Equipment Utilized During Treatment Gait belt    Activity Tolerance Patient tolerated treatment well    Behavior During Therapy WFL for tasks assessed/performed          Past Medical History:  Diagnosis Date   Allergic rhinitis    Allergy    Cataract    beginnings of cataracts    CHF (congestive heart failure) (HCC)    COPD (chronic obstructive pulmonary disease) (HCC)    Diverticulosis    GERD (gastroesophageal reflux disease)    no issues with CPAP-1999   HTN (hypertension)    Hyperlipidemia    Obesity    Obstructive sleep apnea on CPAP    Paroxysmal atrial fibrillation (HCC) 2005   past trauma and infection   Skin cancer    basel cell on nose,arm,leg   Sleep apnea    with CPAP   Ulcerative colitis    Ulcerative colitis, left sided (HCC)    Past Surgical History:  Procedure Laterality Date   BACK SURGERY  2005   BELPHAROPTOSIS REPAIR     bilateral   COLONOSCOPY  2013   Dr. Albertus    FETAL SURGERY FOR CONGENITAL HERNIA  2001   infection in chest that had to be cleaned out     Staph Infection   POLYPECTOMY     SKIN CANCER EXCISION     squamous and basal cell   TONSILLECTOMY     tubular adenoma  2013   UMBILICAL HERNIA REPAIR     VASECTOMY     Patient Active Problem List   Diagnosis Date Noted   History of colonic polyps 04/28/2022   Obesity (BMI 30-39.9) 07/02/2021   CAP (community acquired pneumonia) 03/18/2021   Plantar fasciitis  01/18/2020   Pain due to onychomycosis of toenails of both feet 09/18/2019   Multiple lung nodules 06/07/2018   Influenza with respiratory manifestation 06/06/2015   Ulcerative colitis with complication (HCC) 05/03/2011   PURE HYPERCHOLESTEROLEMIA 03/23/2010   PRECORDIAL PAIN 03/23/2010   CHEST TIGHTNESS-PRESSURE-OTHER 03/18/2010   DYSPNEA 11/19/2008   HYPERLIPIDEMIA 02/01/2008   HYPERTENSION 02/01/2008   ATRIAL FIBRILLATION 02/01/2008   Allergic rhinitis 02/01/2008   COPD GOLD II 02/01/2008   Obstructive sleep apnea 02/01/2008    ONSET DATE: 11/15/2023  REFERRING DIAG: Evonnie Asberry RAMAN, DO   THERAPY DIAG:  Unsteadiness on feet  Other abnormalities of gait and mobility  Other symptoms and signs involving the nervous system  Abnormal posture  Rationale for Evaluation and Treatment: Rehabilitation  SUBJECTIVE:  SUBJECTIVE STATEMENT: Started the full dosage of Cabidopa Levodopa  the other day.   Pt accompanied by: Self   PERTINENT HISTORY: PMH: Parkinsonism (recently diagnosed 10/2023), CHF, COPD, HTN, HLD, hx of skin cancer, hx of back injury in 2005  Per Dr. Evonnie: left hand rest tremor for 3 months. Notes from primary care indicate that patient has had bradykinesia with shuffling gait since 2023, now with more recent tremor.   PAIN:  Are you having pain? No  PRECAUTIONS: None  FALLS: Has patient fallen in last 6 months? 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   COGNITION: Overall cognitive status: Within functional limits for tasks assessed   SENSATION: Pt denies numbness/tingling in legs   COORDINATION: Heel to shin: slightly slower with LLE   POSTURE: rounded shoulders   LOWER EXTREMITY MMT:    MMT Right Eval Left Eval  Hip flexion 5 5  Hip extension    Hip abduction    Hip adduction    Hip internal rotation    Hip external rotation    Knee flexion 5 5  Knee extension 5 4+  Ankle dorsiflexion 5 5  Ankle plantarflexion    Ankle inversion    Ankle eversion    (Blank rows = not tested)  All tested in sitting   BED MOBILITY:  Pt reports some difficulties, using his hand on the night stand to get out of bed, not as easy as it used to be   TRANSFERS: Sit to stand: Modified independence and SBA  Assistive device utilized: None     Stand to sit: Modified independence and SBA  Assistive device utilized: None       GAIT: Findings: Gait Characteristics: step through pattern, decreased arm swing- Right, decreased arm swing- Left, decreased stride length, and decreased trunk rotation, Distance walked: Clinic distances, Assistive device utilized:None, Level of assistance: Modified independence, and Comments: pt with incr LUE tremor during gait   FUNCTIONAL TESTS:  5 times sit to stand: 15.9 seconds with no UE support  10 meter walk test: 10.7 seconds = 3.07 ft/sec    [facility] PT Assessment - 12/08/23 1109       Standardized Balance Assessment   Standardized Balance Assessment Mini-BESTest      Mini-BESTest   Sit To Stand Normal: Comes to stand without use of hands and stabilizes independently.    Rise to Toes Moderate: Heels up, but not full range (smaller than when holding hands), OR noticeable instability for 3 s.   unable to perform full range   Stand on one leg (left)  Moderate: < 20 s   1-2 seconds   Stand on one leg (right) Moderate: < 20 s   3.5 seconds, 5.5 seconds   Stand on one leg - lowest score 1    Compensatory Stepping Correction - Forward Normal: Recovers independently with a single, large step (second realignement is allowed).    Compensatory Stepping Correction - Backward Normal: Recovers independently with a single, large step    Compensatory Stepping Correction - Left  Lateral Moderate: Several steps to recover equilibrium   2 steps   Compensatory Stepping Correction - Right Lateral Normal: Recovers independently with 1 step (crossover or lateral OK)    Stepping Corredtion Lateral - lowest score 1    Stance - Feet together, eyes open, firm surface  Normal: 30s    Stance - Feet together, eyes closed, foam surface  Normal: 30s   mild postural sway   Incline - Eyes Closed Normal: Stands independently 30s and aligns with gravity    Change in Gait Speed Moderate: Unable to change walking speed or signs of imbalance   difficulty changing speed   Walk with head turns - Horizontal Normal: performs head turns with no change in gait speed and good balance    Walk with pivot turns Normal: Turns with feet close FAST (< 3 steps) with good balance.    Step over obstacles Normal: Able to step over box with minimal change of gait speed and with good balance.    Timed UP & GO with Dual Task Moderate: Dual Task affects either counting OR walking (>10%) when compared to the TUG without Dual Task.   incorrect counting   Mini-BEST total score 23                                                                                                                                       TREATMENT DATE: 12/08/23  Therapeutic Activity: Reviewed not taking protein at the same time as Carbidopa  Levodopa , pt reports just taking medication with carbs  Discussed getting scheduled for ST/OT as orders have come in    [facility] PT Assessment - 12/08/23 1109       Standardized  Balance Assessment   Standardized Balance Assessment Mini-BESTest      Mini-BESTest   Sit To Stand Normal: Comes to stand without use of hands and stabilizes independently.    Rise to Toes Moderate: Heels up, but not full range (smaller than when holding hands), OR noticeable instability for 3 s.   unable to perform full range   Stand on one leg (left) Moderate: < 20 s   1-2 seconds   Stand on one leg (right) Moderate: < 20 s   3.5 seconds, 5.5 seconds   Stand on one leg - lowest score 1    Compensatory Stepping Correction - Forward Normal: Recovers independently with a single, large step (second realignement is allowed).    Compensatory Stepping Correction - Backward Normal: Recovers independently with a single, large step    Compensatory Stepping Correction - Left Lateral Moderate: Several steps to recover equilibrium   2 steps   Compensatory Stepping Correction - Right Lateral Normal: Recovers independently with 1 step (crossover or lateral OK)    Stepping Corredtion Lateral - lowest score 1    Stance - Feet together, eyes open, firm surface  Normal: 30s    Stance - Feet together,  eyes closed, foam surface  Normal: 30s   mild postural sway   Incline - Eyes Closed Normal: Stands independently 30s and aligns with gravity    Change in Gait Speed Moderate: Unable to change walking speed or signs of imbalance   difficulty changing speed   Walk with head turns - Horizontal Normal: performs head turns with no change in gait speed and good balance    Walk with pivot turns Normal: Turns with feet close FAST (< 3 steps) with good balance.    Step over obstacles Normal: Able to step over box with minimal change of gait speed and with good balance.    Timed UP & GO with Dual Task Moderate: Dual Task affects either counting OR walking (>10%) when compared to the TUG without Dual Task.   incorrect counting   Mini-BEST total score 23          NMR:  Pt performs PWR! Moves in standing position with  chair in front of pt as needed    PWR! Up for improved posture 20 reps   PWR! Rock for improved weight shifting 20 reps, plus an additional 10 reps trying to lift up contralateral leg for dynamic SLS, pt losing balance a couple of times when going to the R, but able to catch balance using stepping   PWR! Twist for improved trunk rotation 20 reps   PWR! Step for improved step initiation 20 reps   Cues provided for technique, larger amplitude movements, and trying to work up to a RPE of 7/10 when performing  Discussed can add in [facility] with lifting up opposite leg for SLS for an additional challenge as long as he is near a counter or chair just in case for balance    PATIENT EDUCATION: Education details: Standing PWR moves to [employer] and purpose of exercises, results of miniBEST and areas that PT will work on  Person educated: Patient Education method: Programmer, multimedia, Facilities manager, Verbal cues, and Handouts Education comprehension: verbalized understanding and returned demonstration  HOME EXERCISE PROGRAM: Standing PWR Moves   GOALS: Goals reviewed with patient? Yes  SHORT TERM GOALS: ALL STGS = LTGS  LONG TERM GOALS: Target date: 01/02/2024  Pt will be independent with final HEP for PD specific deficits in order to build upon functional gains made in therapy.  Baseline:  Goal status: INITIAL  2.  Pt will improve miniBEST to at least a 25/28 in order to demo decr fall risk.  Baseline: 23/28 Goal status: INITIAL  3.  Pt will verbalize understanding of local Parkinson's disease resources, including options for continue community fitness.  Baseline:  Goal status: INITIAL  4.  Pt will improve 5x sit<>stand to less than or equal to 13 sec to demonstrate improved functional strength and transfer efficiency.   Baseline: 15.9 seconds with no UE support Goal status: INITIAL   ASSESSMENT:  CLINICAL IMPRESSION: Assessed miniBEST with pt scoring a 23/28, which does not put pt at an  incr risk for falls. Pt did have most difficulty with raising up onto toes, SLS, L lateral stepping, changing gait speed, and cog dual tasking. LTG updated. Remainder of session focused on adding in standing PWR moves to HEP to address balance and larger amplitude movement patterns. Will continue per POC.    OBJECTIVE IMPAIRMENTS: Abnormal gait, decreased activity tolerance, decreased balance, decreased coordination, decreased knowledge of condition, decreased mobility, difficulty walking, decreased strength, impaired flexibility, and postural dysfunction.   ACTIVITY LIMITATIONS: bed mobility and locomotion level  PARTICIPATION LIMITATIONS: community activity  PERSONAL FACTORS: Age, Behavior pattern, Past/current experiences, Time since onset of injury/illness/exacerbation, and 3+ comorbidities: Parkinsonism (recently diagnosed 10/2023), CHF, COPD, HTN, HLD, hx of skin cancer, hx of back injury in 2005 are also affecting patient's functional outcome.   REHAB POTENTIAL: Good  CLINICAL DECISION MAKING: Stable/uncomplicated  EVALUATION COMPLEXITY: Low  PLAN:  PT FREQUENCY: 2x/week  PT DURATION: 8 weeks - due to delay in scheduling, anticipate just 4 weeks   PLANNED INTERVENTIONS: 97164- PT Re-evaluation, 97110-Therapeutic exercises, 97530- Therapeutic activity, 97112- Neuromuscular re-education, 97535- Self Care, [id number]- Manual therapy, Patient/Family education, Balance training, and Stair training  PLAN FOR NEXT SESSION: high level balance - stepping strategies, SLS tasks, unlevel surfaces, cog dual tasking. Work on dynamic gait tasks with arm swing   [employer], PT,DPT 12/08/2023, 11:54 AM

## 2023-12-20 ENCOUNTER — Encounter: Payer: Self-pay | Admitting: Physical Therapy

## 2023-12-20 ENCOUNTER — Ambulatory Visit: Admitting: Physical Therapy

## 2023-12-20 DIAGNOSIS — R29818 Other symptoms and signs involving the nervous system: Secondary | ICD-10-CM

## 2023-12-20 DIAGNOSIS — R2689 Other abnormalities of gait and mobility: Secondary | ICD-10-CM

## 2023-12-20 DIAGNOSIS — R2681 Unsteadiness on feet: Secondary | ICD-10-CM

## 2023-12-20 DIAGNOSIS — R293 Abnormal posture: Secondary | ICD-10-CM

## 2023-12-20 NOTE — Therapy (Signed)
 OUTPATIENT PHYSICAL THERAPY NEURO TREATMENT   Patient Name: Ricardo Cooper MRN: [id number] DOB:Mar 23, 1946, 78 y.o., male Today's Date: 12/20/2023   PCP: [name]., MD  REFERRING PROVIDER: Evonnie Asberry RAMAN, DO    END OF SESSION:  PT End of Session - 12/20/23 0930     Visit Number 3    Number of Visits 9    Date for PT Re-Evaluation 02/03/24   due to delay in scheduling   Authorization Type [employer]    PT Start Time 0928    PT Stop Time 1010    PT Time Calculation (min) 42 min    Equipment Utilized During Treatment Gait belt    Activity Tolerance Patient tolerated treatment well    Behavior During Therapy WFL for tasks assessed/performed          Past Medical History:  Diagnosis Date   Allergic rhinitis    Allergy    Cataract    beginnings of cataracts    CHF (congestive heart failure) (HCC)    COPD (chronic obstructive pulmonary disease) (HCC)    Diverticulosis    GERD (gastroesophageal reflux disease)    no issues with CPAP-1999   HTN (hypertension)    Hyperlipidemia    Obesity    Obstructive sleep apnea on CPAP    Paroxysmal atrial fibrillation (HCC) 2005   past trauma and infection   Skin cancer    basel cell on nose,arm,leg   Sleep apnea    with CPAP   Ulcerative colitis    Ulcerative colitis, left sided (HCC)    Past Surgical History:  Procedure Laterality Date   BACK SURGERY  2005   BELPHAROPTOSIS REPAIR     bilateral   COLONOSCOPY  2013   Dr. Albertus    FETAL SURGERY FOR CONGENITAL HERNIA  2001   infection in chest that had to be cleaned out     Staph Infection   POLYPECTOMY     SKIN CANCER EXCISION     squamous and basal cell   TONSILLECTOMY     tubular adenoma  2013   UMBILICAL HERNIA REPAIR     VASECTOMY     Patient Active Problem List   Diagnosis Date Noted   History of colonic polyps 04/28/2022   Obesity (BMI 30-39.9) 07/02/2021   CAP (community acquired pneumonia) 03/18/2021   Plantar fasciitis  01/18/2020   Pain due to onychomycosis of toenails of both feet 09/18/2019   Multiple lung nodules 06/07/2018   Influenza with respiratory manifestation 06/06/2015   Ulcerative colitis with complication (HCC) 05/03/2011   PURE HYPERCHOLESTEROLEMIA 03/23/2010   PRECORDIAL PAIN 03/23/2010   CHEST TIGHTNESS-PRESSURE-OTHER 03/18/2010   DYSPNEA 11/19/2008   HYPERLIPIDEMIA 02/01/2008   HYPERTENSION 02/01/2008   ATRIAL FIBRILLATION 02/01/2008   Allergic rhinitis 02/01/2008   COPD GOLD II 02/01/2008   Obstructive sleep apnea 02/01/2008    ONSET DATE: 11/15/2023  REFERRING DIAG: Evonnie Asberry RAMAN, DO   THERAPY DIAG:  Other abnormalities of gait and mobility  Unsteadiness on feet  Other symptoms and signs involving the nervous system  Abnormal posture  Rationale for Evaluation and Treatment: Rehabilitation  SUBJECTIVE:  SUBJECTIVE STATEMENT: Had a good time at the beach, didn't go in the water, but read some books and ate some good seafood. Is signed up for the PD symposium   Pt accompanied by: Self   PERTINENT HISTORY: PMH: Parkinsonism (recently diagnosed 10/2023), CHF, COPD, HTN, HLD, hx of skin cancer, hx of back injury in 2005  Per Dr. Evonnie: left hand rest tremor for 3 months. Notes from primary care indicate that patient has had bradykinesia with shuffling gait since 2023, now with more recent tremor.   PAIN:  Are you having pain? No  PRECAUTIONS: None  FALLS: Has patient fallen in last 6 months? 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   COGNITION: Overall cognitive status: Within functional limits for tasks assessed   SENSATION: Pt denies numbness/tingling in legs   COORDINATION: Heel to shin: slightly slower with LLE   POSTURE: rounded shoulders   LOWER EXTREMITY MMT:    MMT Right Eval Left Eval  Hip flexion 5 5  Hip extension    Hip abduction    Hip adduction    Hip internal rotation    Hip external rotation    Knee flexion 5 5  Knee extension 5 4+  Ankle dorsiflexion 5 5  Ankle plantarflexion    Ankle inversion    Ankle eversion    (Blank rows = not tested)  All tested in sitting   BED MOBILITY:  Pt reports some difficulties, using his hand on the night stand to get out of bed, not as easy as it used to be   TRANSFERS: Sit to stand: Modified independence and SBA  Assistive device utilized: None     Stand to sit: Modified independence and SBA  Assistive device utilized: None       GAIT: Findings: Gait Characteristics: step through pattern, decreased arm swing- Right, decreased arm swing- Left, decreased stride length, and decreased trunk rotation, Distance walked: Clinic distances, Assistive device utilized:None, Level of assistance: Modified independence, and Comments: pt with incr LUE tremor during gait   FUNCTIONAL TESTS:  5 times sit to stand: 15.9 seconds with no UE support  10 meter walk test: 10.7 seconds = 3.07 ft/sec                                                                                                                                   TREATMENT DATE: 12/20/23  Therapeutic Exercise:  SciFit with BUE/BLE at Gear 5.0 Multi-peaks for 8  minutes for neural priming, incr amplitude of stepping, reciprocal movement patterns. Pt reporting this feel like  a good resistance, pt reporting RPE as 6/10   Showed piriformis stretch to L side in supine position as pt reporting difficulty with getting his L foot up on his knee to help get his sock on, pt able to tolerate well in supine for 3 x 30 seconds and plans to work on this at home. Also discussed thinking of moving bigger when trying to lift up LLE across his knee   NMR: Needing initial cues for reciprocal arm swing during gait, pt responds well to cues  On blue foam beam: 5 reps sit <> stands with no UE support - initial cues for incr forward lean, an additional 10 reps with holding 10# medicine ball and performs med ball slam with raising ball up overhead in standing   With 6 blaze pods (3 on first step, 3 on 2nd step) to work on weight shifting, SLS, foot clearance, reaction times. Performed on blue foam beam, no UE support, CGA for balance, cued for alternating between legs Performed 2 bouts of 1 minute each: 22 hits, 8 hits and 7 hits (naming animals from A-Z), 6 hits (naming different plants, pt requesting this topic) Noted incr LUE hand tremor noted with cognitive dual task   Standing on air ex perpendicular to rebounder:  Stepping off air ex with leg closer to rebounder and performing trunk rotation and throwing 1 kg ball with both hands and catching it and then stepping back on, performed 15 reps each side, CGA/min A for balance, cued to try to throw more with LUE, pt more challenged when having LLE as posteriorly leg when stepping back on for balance, pt initially having to take a couple steps to maintain balance when stepping back on air ex, but did improve with incr reps   PATIENT EDUCATION: Education details: Continue HEP, working on arm swing with gait  Person educated: Patient Education method: Medical illustrator Education comprehension: verbalized understanding and returned demonstration  HOME EXERCISE PROGRAM: Standing PWR Moves   GOALS: Goals reviewed with  patient? Yes  SHORT TERM GOALS: ALL STGS = LTGS  LONG TERM GOALS: Target date: 01/02/2024  Pt will be independent with final HEP for PD specific deficits in order to build upon functional gains made in therapy.  Baseline:  Goal status: INITIAL  2.  Pt will improve miniBEST to at least a 25/28 in order to demo decr fall risk.  Baseline: 23/28 Goal status: INITIAL  3.  Pt will verbalize understanding of local Parkinson's disease resources, including options for continue community fitness.  Baseline:  Goal status: INITIAL  4.  Pt will improve 5x sit<>stand to less than or equal to 13 sec to demonstrate improved functional strength and transfer efficiency.   Baseline: 15.9 seconds with no UE support Goal status: INITIAL   ASSESSMENT:  CLINICAL IMPRESSION: Today's skilled session focused on SciFit for larger amplitude movements/neural priming and high level balance tasks working on stepping strategies, SLS stability, compliant surfaces, and dual tasking. With SLS activity with blaze pods, pt with more of a challenge with balance task with adding in cognitive dual task. Pt did not lose his balance, but performing more slowly and did demo incr LUE tremor. Will continue per POC.    OBJECTIVE IMPAIRMENTS: Abnormal gait, decreased activity tolerance, decreased balance, decreased coordination, decreased knowledge of condition, decreased mobility, difficulty walking, decreased strength, impaired flexibility, and postural dysfunction.   ACTIVITY  LIMITATIONS: bed mobility and locomotion level  PARTICIPATION LIMITATIONS: community activity  PERSONAL FACTORS: Age, Behavior pattern, Past/current experiences, Time since onset of injury/illness/exacerbation, and 3+ comorbidities: Parkinsonism (recently diagnosed 10/2023), CHF, COPD, HTN, HLD, hx of skin cancer, hx of back injury in 2005 are also affecting patient's functional outcome.   REHAB POTENTIAL: Good  CLINICAL DECISION MAKING:  Stable/uncomplicated  EVALUATION COMPLEXITY: Low  PLAN:  PT FREQUENCY: 2x/week  PT DURATION: 8 weeks - due to delay in scheduling, anticipate just 4 weeks   PLANNED INTERVENTIONS: 97164- PT Re-evaluation, 97110-Therapeutic exercises, 97530- Therapeutic activity, 97112- Neuromuscular re-education, 97535- Self Care, [id number]- Manual therapy, Patient/Family education, Balance training, and Stair training  PLAN FOR NEXT SESSION: high level balance - stepping strategies, SLS tasks, unlevel surfaces, cog dual tasking!!! Work on dynamic gait tasks with arm swing   Could he tolerate quadruped PWR moves? Provide community resources sheet   [place] LOISE Senate, PT,DPT 12/20/2023, 10:16 AM

## 2023-12-21 NOTE — Therapy (Signed)
 OUTPATIENT OCCUPATIONAL THERAPY PARKINSON'S EVALUATION  Patient Name: Ricardo Cooper MRN: [id number] DOB:1945-05-07, 78 y.o., male Today's Date: 12/22/2023  PCP: [name], MD REFERRING PROVIDER: Evonnie Asberry RAMAN, DO  END OF SESSION:  OT End of Session - 12/22/23 1224     Visit Number 1    Number of Visits 17    Date for OT Re-Evaluation 02/21/24    Authorization Type [employer]    Progress Note Due on Visit 10    OT Start Time 1025   pt arrived late   OT Stop Time 1105    OT Time Calculation (min) 40 min    Activity Tolerance Patient tolerated treatment well    Behavior During Therapy WFL for tasks assessed/performed          Past Medical History:  Diagnosis Date   Allergic rhinitis    Allergy    Cataract    beginnings of cataracts    CHF (congestive heart failure) (HCC)    COPD (chronic obstructive pulmonary disease) (HCC)    Diverticulosis    GERD (gastroesophageal reflux disease)    no issues with CPAP-1999   HTN (hypertension)    Hyperlipidemia    Obesity    Obstructive sleep apnea on CPAP    Paroxysmal atrial fibrillation (HCC) 2005   past trauma and infection   Skin cancer    basel cell on nose,arm,leg   Sleep apnea    with CPAP   Ulcerative colitis    Ulcerative colitis, left sided (HCC)    Past Surgical History:  Procedure Laterality Date   BACK SURGERY  2005   BELPHAROPTOSIS REPAIR     bilateral   COLONOSCOPY  2013   Dr. Albertus    FETAL SURGERY FOR CONGENITAL HERNIA  2001   infection in chest that had to be cleaned out     Staph Infection   POLYPECTOMY     SKIN CANCER EXCISION     squamous and basal cell   TONSILLECTOMY     tubular adenoma  2013   UMBILICAL HERNIA REPAIR     VASECTOMY     Patient Active Problem List   Diagnosis Date Noted   History of colonic polyps 04/28/2022   Obesity (BMI 30-39.9) 07/02/2021   CAP (community acquired pneumonia) 03/18/2021   Plantar fasciitis 01/18/2020   Pain due to  onychomycosis of toenails of both feet 09/18/2019   Multiple lung nodules 06/07/2018   Influenza with respiratory manifestation 06/06/2015   Ulcerative colitis with complication (HCC) 05/03/2011   PURE HYPERCHOLESTEROLEMIA 03/23/2010   PRECORDIAL PAIN 03/23/2010   CHEST TIGHTNESS-PRESSURE-OTHER 03/18/2010   DYSPNEA 11/19/2008   HYPERLIPIDEMIA 02/01/2008   HYPERTENSION 02/01/2008   ATRIAL FIBRILLATION 02/01/2008   Allergic rhinitis 02/01/2008   COPD GOLD II 02/01/2008   Obstructive sleep apnea 02/01/2008    ONSET DATE: 12/06/2023  REFERRING DIAG: G20.A1 (ICD-10-CM) - Parkinson's disease without dyskinesia or fluctuating manifestations (HCC)  THERAPY DIAG:  Other lack of coordination  Unsteadiness on feet  Other symptoms and signs involving the nervous system  Other symptoms and signs involving the musculoskeletal system  Abnormal posture  Rationale for Evaluation and Treatment: Rehabilitation  SUBJECTIVE:   SUBJECTIVE STATEMENT: I was recently diagnosed with PD but my primary doctor suspected it last year Pt accompanied by: self  PERTINENT HISTORY: PMH: Parkinsonism (recently diagnosed 10/2023), CHF, COPD, HTN, HLD, hx of skin cancer, hx of back injury in 2005   Per Dr. Evonnie: left hand rest tremor for  3 months. Notes from primary care indicate that patient has had bradykinesia with shuffling gait since 2023, now with more recent tremor.   PRECAUTIONS: None  WEIGHT BEARING RESTRICTIONS: No  PAIN:  Are you having pain? No  FALLS: Has patient fallen in last 6 months? 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  HAND DOMINANCE: Right  ADLs: Overall ADLs: difficulty donning sock Lt side Transfers/ambulation related to ADLs: independent Eating: Independent Grooming: independent UB Dressing: more difficulty with jacket and buttons LB Dressing: more difficulty w/ Lt shoe and sock Toileting: difficulty w/ wiping in back  Bathing: mod I w/ walk in shower and 1 grab bar Tub Shower transfers: mod I  Equipment: Grab bars and built in shower seat  IADLs: Shopping: pt/wife go together Light housekeeping: pt doing Presenter, broadcasting and taking out trash Meal Prep: independent  Community mobility: difficulty turning neck to change lanes, etc.  Medication management: independent  Financial management: wife does Handwriting: 90% legible and writes quickly (mild micrographia if writing longer)   MOBILITY STATUS: Independent  POSTURE COMMENTS:  rounded shoulders and increased thoracic kyphosis   FUNCTIONAL OUTCOME MEASURES: Fastening/unfastening 3 buttons: 35.22 sec.  Physical performance test: PPT#2 (simulated eating) 11.66 & PPT#4 (donning/doffing jacket): 16.32 (w/ hospital gown)   COORDINATION: 9 Hole Peg test: Right: 28.57 sec; Left: 30.03 sec Box and Blocks:  Right 51 blocks, Left 42 blocks  UE ROM:  RUE AROM WNL's, LUE AROM WFL's w/ decreased end range shoulder flexion (pt diagnosed with frozen shoulder) and decreased elbow extension LUE   SENSATION: WFL  MUSCLE TONE: not tested  COGNITION: Overall cognitive status: pt reports short term memory deficits  OBSERVATIONS: Hypokinesia and stiffness in neck and Lt side (shoulder, leg), decreased voice volume                                                                                                                    TREATMENT DATE: 12/22/23  Discussed O.T. findings/POC and early symptoms of PD including: non motor and motor  symptoms, and pt's with PD at higher risk for frozen shoulder and rotator cuff injuries   PATIENT EDUCATION: Education details: see above Person educated: Patient Education method: Explanation Education comprehension: verbalized understanding  HOME EXERCISE PROGRAM: N/A  GOALS: Goals reviewed with patient? Yes  SHORT TERM GOALS: Target date: 01/22/24  Independent with PD specific HEP   Baseline: Goal status: INITIAL  2.  Pt will verbalize understanding of adaptive strategies to increase ease with ADLS/IADLS   Baseline:  Goal status: INITIAL  3.  Pt  to report greater ease with donning shoes/socks, jacket, and buttons Baseline:  Goal status: INITIAL  4.  Pt will demonstrate increased ease with dressing as evidenced by decreasing PPT#4 (don/ doff jacket) to 12 secs or less  Baseline: 16 sec Goal status: INITIAL  5.  Pt to improve LUE function as evidenced by performing 46 blocks on Box & Blocks test Baseline: 42 blocks (Rt = 51)  Goal status: INITIAL   LONG TERM GOALS: Target date: 02/21/24  Pt will verbalize understanding of ways to prevent future PD related complications and appropriate community resources prn  Baseline:  Goal status: INITIAL  2.  Pt will verbalize understanding of ways to keep thinking skills sharp and ways to compensate for STM changes in the future  Baseline:  Goal status: INITIAL  3.  Pt will demonstrate improved ease with fastening buttons as evidenced by decreasing 3 button/unbutton time by 5 seconds  Baseline: 35 sec Goal status: INITIAL  4.  Pt will demonstrate improved fine motor coordination for ADLs as evidenced by decreasing 9 hole peg test score for Rt hand by 3 secs  Baseline: 28 sec Goal status: INITIAL  5.  Pt will demonstrate improved fine motor coordination for ADLs as evidenced by decreasing 9 hole peg test score for Lt hand by 5 secs  Baseline: 30 sec Goal status: INITIAL   ASSESSMENT:  CLINICAL IMPRESSION: Patient is  a 78 y.o. male who was seen today for occupational therapy evaluation for Parkinson's disease diagnosed in 2025. Pt presents today with bradykinesia, hypokinesia, stiffness, decreased LUE shoulder ROM, decreased coordination, posture and slower/difficulty with ADLS. Pt would benefit from skilled O.T. to address these deficits, develop PD specific HEP, and educate pt on future PD related complications in order to hopefully slow down progression of disease process.    PERFORMANCE DEFICITS: in functional skills including ADLs, IADLs, coordination, dexterity, ROM, strength, pain, flexibility, Fine motor control, Gross motor control, mobility, balance, body mechanics, endurance, decreased knowledge of use of DME, and UE functional use, cognitive skills including memory.   IMPAIRMENTS: are limiting patient from ADLs, IADLs, leisure, and social participation.   COMORBIDITIES:  may have co-morbidities  that affects occupational performance. Patient will benefit from skilled OT to address above impairments and improve overall function.  MODIFICATION OR ASSISTANCE TO COMPLETE EVALUATION: No modification of tasks or assist necessary to complete an evaluation.  OT OCCUPATIONAL PROFILE AND HISTORY: Detailed assessment: Review of records and additional review of physical, cognitive, psychosocial history related to current functional performance.  CLINICAL DECISION MAKING: Moderate - several treatment options, min-mod task modification necessary  REHAB POTENTIAL: Good  EVALUATION COMPLEXITY: Moderate    PLAN:  OT FREQUENCY: 2x/week  OT DURATION: 8 weeks  PLANNED INTERVENTIONS: 97535 self care/ADL training, 02889 therapeutic exercise, 97530 therapeutic activity, 97112 neuromuscular re-education, 97140 manual therapy, 97010 moist heat, 97750 Physical Performance Testing, 02239 Orthotic Initial, [id number] Orthotic/Prosthetic subsequent, passive range of motion, functional mobility training, visual/perceptual  remediation/compensation, patient/family education, and DME and/or AE instructions  RECOMMENDED OTHER SERVICES: none at this time (Pt has SLP evaluation scheduled)   CONSULTED AND AGREED WITH PLAN OF CARE: Patient  PLAN FOR NEXT SESSION: PWR! Supine   Burnard JINNY Roads, OT 12/22/2023, 12:25 PM

## 2023-12-22 ENCOUNTER — Encounter: Payer: Self-pay | Admitting: Physical Therapy

## 2023-12-22 ENCOUNTER — Ambulatory Visit: Admitting: Physical Therapy

## 2023-12-22 ENCOUNTER — Ambulatory Visit: Admitting: Occupational Therapy

## 2023-12-22 DIAGNOSIS — R29818 Other symptoms and signs involving the nervous system: Secondary | ICD-10-CM

## 2023-12-22 DIAGNOSIS — R278 Other lack of coordination: Secondary | ICD-10-CM

## 2023-12-22 DIAGNOSIS — R2681 Unsteadiness on feet: Secondary | ICD-10-CM

## 2023-12-22 DIAGNOSIS — R293 Abnormal posture: Secondary | ICD-10-CM

## 2023-12-22 DIAGNOSIS — R2689 Other abnormalities of gait and mobility: Secondary | ICD-10-CM

## 2023-12-22 DIAGNOSIS — R29898 Other symptoms and signs involving the musculoskeletal system: Secondary | ICD-10-CM

## 2023-12-22 NOTE — Therapy (Signed)
 OUTPATIENT PHYSICAL THERAPY NEURO TREATMENT   Patient Name: Ricardo Cooper MRN: [id number] DOB:04-07-1946, 78 y.o., male Today's Date: 12/22/2023   PCP: [name]., MD  REFERRING PROVIDER: Evonnie Asberry RAMAN, DO    END OF SESSION:  PT End of Session - 12/22/23 1148     Visit Number 4    Number of Visits 9    Date for PT Re-Evaluation 02/03/24   due to delay in scheduling   Authorization Type [employer]    PT Start Time 1147    PT Stop Time 1228    PT Time Calculation (min) 41 min    Equipment Utilized During Treatment Gait belt    Activity Tolerance Patient tolerated treatment well    Behavior During Therapy WFL for tasks assessed/performed          Past Medical History:  Diagnosis Date   Allergic rhinitis    Allergy    Cataract    beginnings of cataracts    CHF (congestive heart failure) (HCC)    COPD (chronic obstructive pulmonary disease) (HCC)    Diverticulosis    GERD (gastroesophageal reflux disease)    no issues with CPAP-1999   HTN (hypertension)    Hyperlipidemia    Obesity    Obstructive sleep apnea on CPAP    Paroxysmal atrial fibrillation (HCC) 2005   past trauma and infection   Skin cancer    basel cell on nose,arm,leg   Sleep apnea    with CPAP   Ulcerative colitis    Ulcerative colitis, left sided (HCC)    Past Surgical History:  Procedure Laterality Date   BACK SURGERY  2005   BELPHAROPTOSIS REPAIR     bilateral   COLONOSCOPY  2013   Dr. Albertus    FETAL SURGERY FOR CONGENITAL HERNIA  2001   infection in chest that had to be cleaned out     Staph Infection   POLYPECTOMY     SKIN CANCER EXCISION     squamous and basal cell   TONSILLECTOMY     tubular adenoma  2013   UMBILICAL HERNIA REPAIR     VASECTOMY     Patient Active Problem List   Diagnosis Date Noted   History of colonic polyps 04/28/2022   Obesity (BMI 30-39.9) 07/02/2021   CAP (community acquired pneumonia) 03/18/2021   Plantar fasciitis  01/18/2020   Pain due to onychomycosis of toenails of both feet 09/18/2019   Multiple lung nodules 06/07/2018   Influenza with respiratory manifestation 06/06/2015   Ulcerative colitis with complication (HCC) 05/03/2011   PURE HYPERCHOLESTEROLEMIA 03/23/2010   PRECORDIAL PAIN 03/23/2010   CHEST TIGHTNESS-PRESSURE-OTHER 03/18/2010   DYSPNEA 11/19/2008   HYPERLIPIDEMIA 02/01/2008   HYPERTENSION 02/01/2008   ATRIAL FIBRILLATION 02/01/2008   Allergic rhinitis 02/01/2008   COPD GOLD II 02/01/2008   Obstructive sleep apnea 02/01/2008    ONSET DATE: 11/15/2023  REFERRING DIAG: Evonnie Asberry RAMAN, DO   THERAPY DIAG:  Other abnormalities of gait and mobility  Unsteadiness on feet  Other symptoms and signs involving the nervous system  Abnormal posture  Rationale for Evaluation and Treatment: Rehabilitation  SUBJECTIVE:  SUBJECTIVE STATEMENT: Had his OT eval and doing the little pegs were challenging.   Pt accompanied by: Self   PERTINENT HISTORY: PMH: Parkinsonism (recently diagnosed 10/2023), CHF, COPD, HTN, HLD, hx of skin cancer, hx of back injury in 2005  Per Dr. Evonnie: left hand rest tremor for 3 months. Notes from primary care indicate that patient has had bradykinesia with shuffling gait since 2023, now with more recent tremor.   PAIN:  Are you having pain? No, just a little stiff and sore, was pruning bushes   PRECAUTIONS: None  FALLS: Has patient fallen in last 6 months? 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   COGNITION: Overall cognitive status: Within functional limits for tasks assessed   SENSATION: Pt denies numbness/tingling in legs   COORDINATION: Heel to shin: slightly slower with LLE   POSTURE: rounded shoulders   LOWER EXTREMITY MMT:    MMT Right Eval Left Eval  Hip flexion 5 5  Hip extension    Hip abduction    Hip adduction    Hip internal rotation    Hip external rotation    Knee flexion 5 5  Knee extension 5 4+  Ankle dorsiflexion 5 5  Ankle plantarflexion    Ankle inversion    Ankle eversion    (Blank rows = not tested)  All tested in sitting   BED MOBILITY:  Pt reports some difficulties, using his hand on the night stand to get out of bed, not as easy as it used to be   TRANSFERS: Sit to stand: Modified independence and SBA  Assistive device utilized: None     Stand to sit: Modified independence and SBA  Assistive device utilized: None       GAIT: Findings: Gait Characteristics: step through pattern, decreased arm swing- Right, decreased arm swing- Left, decreased stride length, and decreased trunk rotation, Distance walked: Clinic distances, Assistive device utilized:None, Level of assistance: Modified independence, and Comments: pt with incr LUE tremor during gait   FUNCTIONAL TESTS:  5 times sit to stand: 15.9 seconds with no UE support  10 meter walk test: 10.7 seconds = 3.07 ft/sec                                                                                                                                   TREATMENT DATE: 12/22/23  Therapeutic Exercise:  SciFit with BUE/BLE at Gear 6.0 Multi-peaks for 8 minutes for neural priming, incr amplitude of  stepping, reciprocal movement patterns. Pt reporting this feel like a good resistance, pt  reporting RPE as 6/10   NMR:  Pt performs PWR! Moves in quadruped position: 10 reps    PWR! Up for improved posture  PWR! Rock for improved weight shifting - pt reporting feeling a good stretch in his back   PWR! Twist for improved trunk rotation - pt reporting this felt like a good stretch    Pt able to get on and off the floor mod I with use of UE support from mat table Provided above 3 to work on for HEP to help with mobility/rigidity. Pt reports that he does not have a mat or anything at home, but can perform with a pillow under his knees   With 4 blaze pods on mirror (2 on R and 2 on L) to work on weight shifting, reaching outside of BOS, trunk rotation, reaction times, SLS stability. Performed stepping over smaller orange obstacle each time laterally and reaching across body to tap blaze pod before stepping back to midline, performed on random hit setting, CGA for balance Performed 2 bouts of 1 minute each: 10 hits, 11 hits  Pt initially more challenged with coordinating stepping and reaching across body to tap, but did improve with incr reps   Gait with arm swing and alternating UE scarf toss to work on larger amplitude movements, dual tasking, and coordination. Cued to open up hands big when tossing: First 2 laps (230' x 1 )performed with conversing with therapist with talking about  football  Last lap performed for 9' with cognitive dual tasking of pt naming cities/states in alphabetical order, intermittent cues to open up hands Pt dropped scarf a couple of times   PATIENT EDUCATION: Education details: Continue HEP, working on arm swing with gait,  Quadruped PWR Up, [employer], Twist to [employer] to work on mobility  Person educated: Patient Education method: Programmer, multimedia, Facilities manager, Verbal cues, and Handouts Education comprehension: verbalized understanding and returned demonstration  HOME EXERCISE PROGRAM: Supine piriformis stretch, Quadruped PWR Up, [employer], Twist  Standing  [employer]   GOALS: Goals reviewed with patient? Yes  SHORT TERM GOALS: ALL STGS = LTGS  LONG TERM GOALS: Target date: 01/02/2024  Pt will be independent with final HEP for PD specific deficits in order to build upon functional gains made in therapy.  Baseline:  Goal status: INITIAL  2.  Pt will improve miniBEST to at least a 25/28 in order to demo decr fall risk.  Baseline: 23/28 Goal status: INITIAL  3.  Pt will verbalize understanding of local Parkinson's disease resources, including options for continue community fitness.  Baseline:  Goal status: INITIAL  4.  Pt will improve 5x sit<>stand to less than or equal to 13 sec to demonstrate improved functional strength and transfer efficiency.   Baseline: 15.9 seconds with no UE support Goal status: INITIAL   ASSESSMENT:  CLINICAL IMPRESSION: Today's skilled session focused on SciFit for larger amplitude movements/neural priming and working on quadruped PWR moves to address getting on and off the floor as well as improvements in mobility/rigidity. Pt felt good with these, esp with quadruped PWR Twist, so added to pt's HEP. Remainder of session worked on gait with arm swing/coordination/dual tasking and balance with trunk rotation and stepping over obstacles. Pt initially more challenged when coordinating a reaching and stepping task at the same time, but improved with incr reps.  Will continue per POC.    OBJECTIVE IMPAIRMENTS: Abnormal gait, decreased activity tolerance,  decreased balance, decreased coordination, decreased knowledge of condition, decreased mobility, difficulty walking, decreased strength, impaired flexibility, and postural dysfunction.   ACTIVITY LIMITATIONS: bed mobility and locomotion level  PARTICIPATION LIMITATIONS: community activity  PERSONAL FACTORS: Age, Behavior pattern, Past/current experiences, Time since onset of injury/illness/exacerbation, and 3+ comorbidities: Parkinsonism (recently diagnosed  10/2023), CHF, COPD, HTN, HLD, hx of skin cancer, hx of back injury in 2005 are also affecting patient's functional outcome.   REHAB POTENTIAL: Good  CLINICAL DECISION MAKING: Stable/uncomplicated  EVALUATION COMPLEXITY: Low  PLAN:  PT FREQUENCY: 2x/week  PT DURATION: 8 weeks - due to delay in scheduling, anticipate just 4 weeks   PLANNED INTERVENTIONS: 97164- PT Re-evaluation, 97110-Therapeutic exercises, 97530- Therapeutic activity, 97112- Neuromuscular re-education, 97535- Self Care, [id number]- Manual therapy, Patient/Family education, Balance training, and Stair training  PLAN FOR NEXT SESSION: high level balance - stepping strategies, SLS tasks, unlevel surfaces, cog dual tasking!!! Work on dynamic gait tasks with arm swing  Provide community resources [employer], PT,DPT 12/22/2023, 1:22 PM

## 2023-12-27 ENCOUNTER — Encounter: Payer: Self-pay | Admitting: Physical Therapy

## 2023-12-27 ENCOUNTER — Ambulatory Visit: Attending: Neurology | Admitting: Physical Therapy

## 2023-12-27 DIAGNOSIS — R29898 Other symptoms and signs involving the musculoskeletal system: Secondary | ICD-10-CM | POA: Diagnosis not present

## 2023-12-27 DIAGNOSIS — R2681 Unsteadiness on feet: Secondary | ICD-10-CM | POA: Diagnosis not present

## 2023-12-27 DIAGNOSIS — R293 Abnormal posture: Secondary | ICD-10-CM | POA: Diagnosis not present

## 2023-12-27 DIAGNOSIS — R278 Other lack of coordination: Secondary | ICD-10-CM | POA: Insufficient documentation

## 2023-12-27 DIAGNOSIS — R29818 Other symptoms and signs involving the nervous system: Secondary | ICD-10-CM | POA: Insufficient documentation

## 2023-12-27 NOTE — Therapy (Signed)
 OUTPATIENT PHYSICAL THERAPY NEURO TREATMENT   Patient Name: Ricardo Cooper MRN: [id number] DOB:[birth year], 78 y.o., male Today's Date: 12/27/2023   PCP: [name]., MD  REFERRING PROVIDER: Evonnie Asberry RAMAN, DO    END OF SESSION:  PT End of Session - 12/27/23 0933     Visit Number 5    Number of Visits 9    Date for PT Re-Evaluation 02/03/24   due to delay in scheduling   Authorization Type [employer]    PT Start Time 0931    PT Stop Time 1015    PT Time Calculation (min) 44 min    Equipment Utilized During Treatment Gait belt    Activity Tolerance Patient tolerated treatment well    Behavior During Therapy WFL for tasks assessed/performed          Past Medical History:  Diagnosis Date   Allergic rhinitis    Allergy    Cataract    beginnings of cataracts    CHF (congestive heart failure) (HCC)    COPD (chronic obstructive pulmonary disease) (HCC)    Diverticulosis    GERD (gastroesophageal reflux disease)    no issues with CPAP-1999   HTN (hypertension)    Hyperlipidemia    Obesity    Obstructive sleep apnea on CPAP    Paroxysmal atrial fibrillation (HCC) 2005   past trauma and infection   Skin cancer    basel cell on nose,arm,leg   Sleep apnea    with CPAP   Ulcerative colitis    Ulcerative colitis, left sided (HCC)    Past Surgical History:  Procedure Laterality Date   BACK SURGERY  2005   BELPHAROPTOSIS REPAIR     bilateral   COLONOSCOPY  2013   Dr. Albertus    FETAL SURGERY FOR CONGENITAL HERNIA  2001   infection in chest that had to be cleaned out     Staph Infection   POLYPECTOMY     SKIN CANCER EXCISION     squamous and basal cell   TONSILLECTOMY     tubular adenoma  2013   UMBILICAL HERNIA REPAIR     VASECTOMY     Patient Active Problem List   Diagnosis Date Noted   History of colonic polyps 04/28/2022   Obesity (BMI 30-39.9) 07/02/2021   CAP (community acquired pneumonia) 03/18/2021   Plantar fasciitis 01/18/2020    Pain due to onychomycosis of toenails of both feet 09/18/2019   Multiple lung nodules 06/07/2018   Influenza with respiratory manifestation 06/06/2015   Ulcerative colitis with complication (HCC) 05/03/2011   PURE HYPERCHOLESTEROLEMIA 03/23/2010   PRECORDIAL PAIN 03/23/2010   CHEST TIGHTNESS-PRESSURE-OTHER 03/18/2010   DYSPNEA 11/19/2008   HYPERLIPIDEMIA 02/01/2008   HYPERTENSION 02/01/2008   ATRIAL FIBRILLATION 02/01/2008   Allergic rhinitis 02/01/2008   COPD GOLD II 02/01/2008   Obstructive sleep apnea 02/01/2008    ONSET DATE: 11/15/2023  REFERRING DIAG: Evonnie Asberry RAMAN, DO   THERAPY DIAG:  Unsteadiness on feet  Other symptoms and signs involving the nervous system  Abnormal posture  Rationale for Evaluation and Treatment: Rehabilitation  SUBJECTIVE:  SUBJECTIVE STATEMENT: Had a good weekend, watched football and was out in the yard. Tried the quadruped PWR moves on the floor and they went pretty good.   Pt accompanied by: Self   PERTINENT HISTORY: PMH: Parkinsonism (recently diagnosed 10/2023), CHF, COPD, HTN, HLD, hx of skin cancer, hx of back injury in 2005  Per Dr. Evonnie: left hand rest tremor for 3 months. Notes from primary care indicate that patient has had bradykinesia with shuffling gait since 2023, now with more recent tremor.   PAIN:  Are you having pain? No, just the usual stiffness and soreness  PRECAUTIONS: None  FALLS: Has patient fallen in last 6 months? 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   COGNITION: Overall cognitive status: Within functional limits for tasks assessed   SENSATION: Pt denies numbness/tingling in legs   COORDINATION: Heel to shin: slightly slower with LLE   POSTURE: rounded shoulders   LOWER EXTREMITY MMT:    MMT Right Eval Left Eval  Hip flexion 5 5  Hip extension    Hip abduction    Hip adduction    Hip internal rotation    Hip external rotation    Knee flexion 5 5  Knee extension 5 4+  Ankle dorsiflexion 5 5  Ankle plantarflexion    Ankle inversion    Ankle eversion    (Blank rows = not tested)  All tested in sitting   BED MOBILITY:  Pt reports some difficulties, using his hand on the night stand to get out of bed, not as easy as it used to be   TRANSFERS: Sit to stand: Modified independence and SBA  Assistive device utilized: None     Stand to sit: Modified independence and SBA  Assistive device utilized: None       GAIT: Findings: Gait Characteristics: step through pattern, decreased arm swing- Right, decreased arm swing- Left, decreased stride length, and decreased trunk rotation, Distance walked: Clinic distances, Assistive device utilized:None, Level of assistance: Modified independence, and Comments: pt with incr LUE tremor during gait   FUNCTIONAL TESTS:  5 times sit to stand: 15.9 seconds with no UE support  10 meter walk test: 10.7 seconds = 3.07 ft/sec      TREATMENT DATE: 12/27/23  Therapeutic Activity: SciFit with BUE/BLE at Gear 7.0 Multi-peaks for 8 minutes for neural priming, incr amplitude of stepping, reciprocal movement patterns. Pt reporting this feel like a good resistance, pt reporting RPE as 6-7/10   Provided PD community resources and went over different exercise classes in that community and also  PD support group. Pt to further look at handout and report back with any questions Went over POC going forwards - will plan to D/C at end of next week due to progress (as goals are due at end of next week) and then discussed PD screens that pt will get scheduled likely at the end of OT sessions, pt in agreement with this plan   NMR:  With 4 blaze pods on mirror (2 on R and 2 on  L in lateral and superior lateral directions) to work on weight shifting, reaching outside of BOS, reaction times, SLS stability, and head turns. Performed on focus on one color (tapping the different color), performed having to laterally step over a large orange obstacle while reaching to tap blaze pod for incr foot clearance. CGA and taps to ballet bar as needed for balance. Pt standing on blue foam beam, intermittently losing balance posteriorly  Performed 4 bouts of 1 minute each: 6 hits (2 errors), 10 hits (2 errors), 13 hits (0 errors), 8 hits (with added cognitive challenge of naming animals from A-Z with 2 errors)  Pt reporting RPE as 8/10, pt more challenged with cognitive dual task and pt more challenged with LLE when stepping over obstacle   Retro gait with ball toss with resident with PT having resistance band around pelvis, providing various resistance , down and back over 38' with pt continuing to name animals from A-Z, initial cues for larger steps, cues from therapist for louder voice volume when naming due to hypophonia   Standing on blue foam beam: With multi-directional stepping sheet: performed stepping off and then back on with coordinated UE movement when stepping in different directions, performed 4 reps, pt challenged with remembering what arm movement went with which step and cues for louder voice volume when stepping, pt improved with coordination with incr reps   PATIENT EDUCATION: Education details: Continue HEP, see therapeutic activity section, PD community resources  Person educated:  Patient Education method: Explanation, Demonstration, Verbal cues, and Handouts Education comprehension: verbalized understanding and returned demonstration  HOME EXERCISE PROGRAM: Access Code: [email or web address] URL: [email or web address] Date: 12/27/2023 Prepared by: [name]  Exercises - Supine Figure 4 Piriformis Stretch  - 1 x daily - 7 x weekly - 3 sets - 30 hold  Quadruped PWR Up, Rock, Twist  Standing PWR Moves   GOALS: Goals reviewed with patient? Yes  SHORT TERM GOALS: ALL STGS = LTGS  LONG TERM GOALS: Target date: 01/02/2024  Pt will be independent with final HEP for PD specific deficits in order to build upon functional gains made in therapy.  Baseline:  Goal status: INITIAL  2.  Pt will improve miniBEST to at least a 25/28 in order to demo decr fall risk.  Baseline: 23/28 Goal status: INITIAL  3.  Pt will verbalize understanding of local Parkinson's disease resources, including options for continue community fitness.  Baseline: provided on 9/2 Goal status: MET  4.  Pt will improve 5x sit<>stand to less than or equal to 13 sec to demonstrate improved functional strength and transfer efficiency.   Baseline: 15.9 seconds with no UE support Goal status: INITIAL   ASSESSMENT:  CLINICAL IMPRESSION: Today's skilled session focused on SciFit for larger amplitude movements/neural priming and working on high level balance on compliant surfaces with SLS/stepping and retro gait. When performing blaze pods with focusing on one color, pt initially making more errors by tapping the same color (more so with cognitive dual task), but did improve with incr reps. Pt challenged with coordinating UE movement when working on stepping strategies. Provided pt with community resources for different PD exercise classes and support group. Tried to work on voice volume too with naming during session due to hypophonia (pt will have ST coming up).  Will continue per POC.     OBJECTIVE IMPAIRMENTS: Abnormal gait, decreased activity tolerance, decreased balance, decreased coordination, decreased knowledge of condition, decreased mobility, difficulty walking, decreased strength, impaired flexibility, and postural dysfunction.  ACTIVITY LIMITATIONS: bed mobility and locomotion level  PARTICIPATION LIMITATIONS: community activity  PERSONAL FACTORS: Age, Behavior pattern, Past/current experiences, Time since onset of injury/illness/exacerbation, and 3+ comorbidities: Parkinsonism (recently diagnosed 10/2023), CHF, COPD, HTN, HLD, hx of skin cancer, hx of back injury in 2005 are also affecting patient's functional outcome.   REHAB POTENTIAL: Good  CLINICAL DECISION MAKING: Stable/uncomplicated  EVALUATION COMPLEXITY: Low  PLAN:  PT FREQUENCY: 2x/week  PT DURATION: 8 weeks - due to delay in scheduling, anticipate just 4 weeks   PLANNED INTERVENTIONS: 97164- PT Re-evaluation, 97110-Therapeutic exercises, 97530- Therapeutic activity, 97112- Neuromuscular re-education, 97535- Self Care, [id number]- Manual therapy, Patient/Family education, Balance training, and Stair training  PLAN FOR NEXT SESSION: high level balance - stepping strategies, SLS tasks, unlevel surfaces, cog dual tasking!!!   Pt's goals are due next week  - pt is doing well and anticipate D/C at next Thursday's appt (9/11) and pt was in agreement and talked about PD screens (will probably wait to schedule these until he is done with OT), pt is signed up for PD symposium and provided community resources on 9/2 Will need to cancel last 2 appts after next week   Sheffield LOISE Senate, PT,DPT 12/27/2023, 12:15 PM

## 2023-12-29 ENCOUNTER — Ambulatory Visit: Admitting: Physical Therapy

## 2023-12-29 DIAGNOSIS — R278 Other lack of coordination: Secondary | ICD-10-CM

## 2023-12-29 DIAGNOSIS — R2681 Unsteadiness on feet: Secondary | ICD-10-CM

## 2023-12-29 DIAGNOSIS — R293 Abnormal posture: Secondary | ICD-10-CM

## 2023-12-29 NOTE — Therapy (Signed)
 OUTPATIENT PHYSICAL THERAPY NEURO TREATMENT   Patient Name: Ricardo Cooper MRN: [id number] DOB:January 10, 1946, 78 y.o., male Today's Date: 12/29/2023   PCP: [name]., MD  REFERRING PROVIDER: Evonnie Asberry RAMAN, DO    END OF SESSION:  PT End of Session - 12/29/23 1403     Visit Number 6    Number of Visits 9    Date for PT Re-Evaluation 02/03/24   due to delay in scheduling   Authorization Type [employer]    PT Start Time 1401    PT Stop Time 1445    PT Time Calculation (min) 44 min    Equipment Utilized During Treatment Gait belt    Activity Tolerance Patient tolerated treatment well    Behavior During Therapy WFL for tasks assessed/performed           Past Medical History:  Diagnosis Date   Allergic rhinitis    Allergy    Cataract    beginnings of cataracts    CHF (congestive heart failure) (HCC)    COPD (chronic obstructive pulmonary disease) (HCC)    Diverticulosis    GERD (gastroesophageal reflux disease)    no issues with CPAP-1999   HTN (hypertension)    Hyperlipidemia    Obesity    Obstructive sleep apnea on CPAP    Paroxysmal atrial fibrillation (HCC) 2005   past trauma and infection   Skin cancer    basel cell on nose,arm,leg   Sleep apnea    with CPAP   Ulcerative colitis    Ulcerative colitis, left sided (HCC)    Past Surgical History:  Procedure Laterality Date   BACK SURGERY  2005   BELPHAROPTOSIS REPAIR     bilateral   COLONOSCOPY  2013   Dr. Albertus    FETAL SURGERY FOR CONGENITAL HERNIA  2001   infection in chest that had to be cleaned out     Staph Infection   POLYPECTOMY     SKIN CANCER EXCISION     squamous and basal cell   TONSILLECTOMY     tubular adenoma  2013   UMBILICAL HERNIA REPAIR     VASECTOMY     Patient Active Problem List   Diagnosis Date Noted   History of colonic polyps 04/28/2022   Obesity (BMI 30-39.9) 07/02/2021   CAP (community acquired pneumonia) 03/18/2021   Plantar fasciitis  01/18/2020   Pain due to onychomycosis of toenails of both feet 09/18/2019   Multiple lung nodules 06/07/2018   Influenza with respiratory manifestation 06/06/2015   Ulcerative colitis with complication (HCC) 05/03/2011   PURE HYPERCHOLESTEROLEMIA 03/23/2010   PRECORDIAL PAIN 03/23/2010   CHEST TIGHTNESS-PRESSURE-OTHER 03/18/2010   DYSPNEA 11/19/2008   HYPERLIPIDEMIA 02/01/2008   HYPERTENSION 02/01/2008   ATRIAL FIBRILLATION 02/01/2008   Allergic rhinitis 02/01/2008   COPD GOLD II 02/01/2008   Obstructive sleep apnea 02/01/2008    ONSET DATE: 11/15/2023  REFERRING DIAG: Evonnie Asberry RAMAN, DO   THERAPY DIAG:  Unsteadiness on feet  Other lack of coordination  Abnormal posture  Rationale for Evaluation and Treatment: Rehabilitation  SUBJECTIVE:  SUBJECTIVE STATEMENT: Pt reports being sore today. Pulled up a lot of rose bushes in the yard yesterday so he is feeling it today. No falls.   Pt accompanied by: Self   PERTINENT HISTORY: PMH: Parkinsonism (recently diagnosed 10/2023), CHF, COPD, HTN, HLD, hx of skin cancer, hx of back injury in 2005  Per Dr. Evonnie: left hand rest tremor for 3 months. Notes from primary care indicate that patient has had bradykinesia with shuffling gait since 2023, now with more recent tremor.   PAIN:  Are you having pain? No, just the usual stiffness and soreness  PRECAUTIONS: None  FALLS: Has patient fallen in last 6 months? 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   COGNITION: Overall cognitive status: Within functional limits for tasks assessed   SENSATION: Pt denies numbness/tingling in legs   COORDINATION: Heel to shin: slightly slower with LLE   POSTURE: rounded shoulders   LOWER EXTREMITY MMT:    MMT Right Eval Left Eval  Hip flexion 5 5  Hip extension    Hip abduction    Hip adduction    Hip internal rotation    Hip external rotation    Knee flexion 5 5  Knee extension 5 4+  Ankle dorsiflexion 5 5  Ankle plantarflexion    Ankle inversion    Ankle eversion    (Blank rows = not tested)  All tested in sitting   BED MOBILITY:  Pt reports some difficulties, using his hand on the night stand to get out of bed, not as easy as it used to be   TRANSFERS: Sit to stand: Modified independence and SBA  Assistive device utilized: None     Stand to sit: Modified independence and SBA  Assistive device utilized: None       GAIT: Findings: Gait Characteristics: step through pattern, decreased arm swing- Right, decreased arm swing- Left, decreased stride length, and decreased trunk rotation, Distance walked: Clinic distances, Assistive device utilized:None, Level of assistance: Modified independence, and Comments: pt with incr LUE tremor during gait   FUNCTIONAL TESTS:  5 times sit to stand: 15.9 seconds with no UE support  10 meter walk test: 10.7 seconds = 3.07 ft/sec      TREATMENT:   Therapeutic Activity: SciFit multi-peaks level 8.5 for 8 minutes using BUE/BLEs for neural priming for reciprocal movement, dynamic cardiovascular warmup and increased amplitude of stepping. RPE of 7-8/10 following activity    NMR: Clock yourself for improved LE coordination, stepping strategy, step length/clearance and single leg stability: Round 1: 30spm w/6 gumdrops  placed in circle around pt to facilitate increased step length/clearance. Noted pt maintaining very narrow BOS and frequently stepping on his own heel. Increased difficulty stepping w/RLE in posterior direction. CGA throughout  Round 2: same setup at 20spm w/8# ball slam w/each step. SBA throughout  Round 3: same setup at 30spm w8# ball slam. Increased difficulty keeping up w/increased speed of commands. CGA throughout  In // bars on rocker board in A/P direction for improved stepping strategy, postural control  and lateral weight shifting: Alt retro step off board w/BUE abduction/extension, x10 reps per side. Decreased step length noted w/LLE. SBA throughout  Alt fwd step off board w/BUE abduction/extension, x10 reps per side. Increased difficulty stepping back onto board, especially w/LLE. CGA throughout  At ballet bar, push-ups w/strong push away, alternating between narrow and wide grip, x6 reps. Progressed to adding strong hand clap in between reps, x6 reps. Pt performed well w/no retropulsion noted    PATIENT EDUCATION: Education details: Continue HEP, plan to DC next week, importance of working on large movements  Person educated: Patient Education method: Programmer, multimedia, Facilities manager, Verbal cues, and Handouts Education comprehension: verbalized understanding and returned demonstration  HOME EXERCISE PROGRAM: Access Code: [email or web address] URL: [email or web address] Date: 12/27/2023 Prepared by: [name]  Exercises - Supine Figure 4 Piriformis Stretch  - 1 x daily - 7 x weekly - 3 sets - 30 hold  Quadruped PWR Up, Rock, Twist  Standing PWR Moves   GOALS: Goals reviewed with patient? Yes  SHORT TERM GOALS: ALL STGS = LTGS  LONG TERM GOALS: Target date: 01/02/2024  Pt will be independent with final HEP for PD specific deficits in order to build upon functional gains made in therapy.  Baseline:  Goal status: INITIAL  2.  Pt will improve miniBEST to at least a 25/28  in order to demo decr fall risk.  Baseline: 23/28 Goal status: INITIAL  3.  Pt will verbalize understanding of local Parkinson's disease resources, including options for continue community fitness.  Baseline: provided on 9/2 Goal status: MET  4.  Pt will improve 5x sit<>stand to less than or equal to 13 sec to demonstrate improved functional strength and transfer efficiency.   Baseline: 15.9 seconds with no UE support Goal status: INITIAL   ASSESSMENT:  CLINICAL IMPRESSION: Emphasis of skilled PT session on improved stepping strategy, high amplitude movement, single leg stability and postural control. Pt reporting increased soreness in low back today due to pulling up several rose bushes yesterday, but tolerated session well w/no report of pain. Pt demonstrated increased difficulty stepping w/RLE in posterior direction, frequently knocking over obstacles despite cues for large steps. However, pt able to stabilize independently. Pt exhibits decreased step clearance/length w/LLE, especially on unlevel surfaces, but adapts well and can stabilize independently w/practice. Continue POC.    OBJECTIVE IMPAIRMENTS: Abnormal gait, decreased activity tolerance, decreased balance, decreased coordination, decreased knowledge of condition, decreased mobility, difficulty walking, decreased strength, impaired flexibility, and postural dysfunction.   ACTIVITY LIMITATIONS: bed mobility and locomotion level  PARTICIPATION LIMITATIONS: community activity  PERSONAL FACTORS: Age, Behavior pattern, Past/current experiences, Time since onset of injury/illness/exacerbation, and 3+ comorbidities: Parkinsonism (recently diagnosed 10/2023), CHF, COPD, HTN, HLD, hx of skin cancer, hx of back injury in 2005 are also affecting patient's functional outcome.   REHAB POTENTIAL: Good  CLINICAL DECISION MAKING: Stable/uncomplicated  EVALUATION COMPLEXITY: Low  PLAN:  PT FREQUENCY: 2x/week  PT DURATION: 8 weeks -  due to delay in scheduling, anticipate just 4 weeks   PLANNED INTERVENTIONS: 97164- PT Re-evaluation, 97110-Therapeutic exercises, 97530- Therapeutic activity, 97112- Neuromuscular re-education, 97535- Self Care, 02859- Manual therapy, Patient/Family education, Balance training, and Stair training  PLAN FOR NEXT SESSION: high level balance - stepping strategies, SLS tasks, unlevel surfaces, cog dual tasking!!! Rebounder   Pt's goals are due next week  - pt is doing well and anticipate D/C at next Thursday's appt (9/11) and pt was in agreement and talked about PD screens (will probably wait to schedule these until  he is done with OT), pt is signed up for PD symposium and provided community resources on 9/2 Will need to cancel last 2 appts after next week   [place] Wheat E Syona Wroblewski, PT,DPT 12/29/2023, 2:45 PM

## 2024-01-03 ENCOUNTER — Ambulatory Visit: Admitting: Physical Therapy

## 2024-01-03 ENCOUNTER — Encounter: Payer: Self-pay | Admitting: Occupational Therapy

## 2024-01-03 ENCOUNTER — Ambulatory Visit: Admitting: Occupational Therapy

## 2024-01-03 DIAGNOSIS — R278 Other lack of coordination: Secondary | ICD-10-CM

## 2024-01-03 DIAGNOSIS — R2681 Unsteadiness on feet: Secondary | ICD-10-CM

## 2024-01-03 DIAGNOSIS — R293 Abnormal posture: Secondary | ICD-10-CM

## 2024-01-03 DIAGNOSIS — R29898 Other symptoms and signs involving the musculoskeletal system: Secondary | ICD-10-CM

## 2024-01-03 DIAGNOSIS — R29818 Other symptoms and signs involving the nervous system: Secondary | ICD-10-CM

## 2024-01-03 NOTE — Therapy (Signed)
 OUTPATIENT OCCUPATIONAL THERAPY PARKINSON'S TREATMENT  Patient Name: Ricardo Cooper MRN: [id number] DOB:Jun 03, 1945, 78 y.o., male Today's Date: 01/03/2024  PCP: [name], MD REFERRING PROVIDER: Evonnie Asberry RAMAN, DO  END OF SESSION:  OT End of Session - 01/03/24 1022     Visit Number 2    Number of Visits 17    Date for OT Re-Evaluation 02/21/24    Authorization Type [employer]    Progress Note Due on Visit 10    OT Start Time 1020    OT Stop Time 1100    OT Time Calculation (min) 40 min    Activity Tolerance Patient tolerated treatment well    Behavior During Therapy WFL for tasks assessed/performed          Past Medical History:  Diagnosis Date   Allergic rhinitis    Allergy    Cataract    beginnings of cataracts    CHF (congestive heart failure) (HCC)    COPD (chronic obstructive pulmonary disease) (HCC)    Diverticulosis    GERD (gastroesophageal reflux disease)    no issues with CPAP-1999   HTN (hypertension)    Hyperlipidemia    Obesity    Obstructive sleep apnea on CPAP    Paroxysmal atrial fibrillation (HCC) 2005   past trauma and infection   Skin cancer    basel cell on nose,arm,leg   Sleep apnea    with CPAP   Ulcerative colitis    Ulcerative colitis, left sided (HCC)    Past Surgical History:  Procedure Laterality Date   BACK SURGERY  2005   BELPHAROPTOSIS REPAIR     bilateral   COLONOSCOPY  2013   Dr. Albertus    FETAL SURGERY FOR CONGENITAL HERNIA  2001   infection in chest that had to be cleaned out     Staph Infection   POLYPECTOMY     SKIN CANCER EXCISION     squamous and basal cell   TONSILLECTOMY     tubular adenoma  2013   UMBILICAL HERNIA REPAIR     VASECTOMY     Patient Active Problem List   Diagnosis Date Noted   History of colonic polyps 04/28/2022   Obesity (BMI 30-39.9) 07/02/2021   CAP (community acquired pneumonia) 03/18/2021   Plantar fasciitis 01/18/2020   Pain due to onychomycosis of toenails of  both feet 09/18/2019   Multiple lung nodules 06/07/2018   Influenza with respiratory manifestation 06/06/2015   Ulcerative colitis with complication (HCC) 05/03/2011   PURE HYPERCHOLESTEROLEMIA 03/23/2010   PRECORDIAL PAIN 03/23/2010   CHEST TIGHTNESS-PRESSURE-OTHER 03/18/2010   DYSPNEA 11/19/2008   HYPERLIPIDEMIA 02/01/2008   HYPERTENSION 02/01/2008   ATRIAL FIBRILLATION 02/01/2008   Allergic rhinitis 02/01/2008   COPD GOLD II 02/01/2008   Obstructive sleep apnea 02/01/2008    ONSET DATE: 12/06/2023  REFERRING DIAG: G20.A1 (ICD-10-CM) - Parkinson's disease without dyskinesia or fluctuating manifestations (HCC)  THERAPY DIAG:  Abnormal posture  Other symptoms and signs involving the nervous system  Other symptoms and signs involving the musculoskeletal system  Other lack of coordination  Rationale for Evaluation and Treatment: Rehabilitation  SUBJECTIVE:   SUBJECTIVE STATEMENT: I have soreness/stiffness in Lt shoulder  Pt accompanied by: self  PERTINENT HISTORY: PMH: Parkinsonism (recently diagnosed 10/2023), CHF, COPD, HTN, HLD, hx of skin cancer, hx of back injury in 2005   Per Dr. Evonnie: left hand rest tremor for 3 months. Notes from primary care indicate that patient has had bradykinesia with shuffling gait  since 2023, now with more recent tremor.   PRECAUTIONS: None  WEIGHT BEARING RESTRICTIONS: No  PAIN:  Are you having pain? No  FALLS: Has patient fallen in last 6 months? 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  HAND DOMINANCE: Right  ADLs: Overall ADLs: difficulty donning sock Lt side Transfers/ambulation related to ADLs: independent Eating: Independent Grooming: independent UB Dressing: more difficulty with jacket and buttons LB Dressing: more difficulty w/ Lt shoe and sock Toileting: difficulty w/ wiping in back  Bathing: mod I w/ walk in shower and 1 grab bar Tub Shower transfers: mod I  Equipment: Grab bars and built in shower seat  IADLs: Shopping: pt/wife go together Light housekeeping: pt doing Presenter, broadcasting and taking out trash Meal Prep: independent  Community mobility: difficulty turning neck to change lanes, etc.  Medication management: independent  Financial management: wife does Handwriting: 90% legible and writes quickly (mild micrographia if writing longer)   MOBILITY STATUS: Independent  POSTURE COMMENTS:  rounded shoulders and increased thoracic kyphosis   FUNCTIONAL OUTCOME MEASURES: Fastening/unfastening 3 buttons: 35.22 sec.  Physical performance test: PPT#2 (simulated eating) 11.66 & PPT#4 (donning/doffing jacket): 16.32 (w/ hospital gown)   COORDINATION: 9 Hole Peg test: Right: 28.57 sec; Left: 30.03 sec Box and Blocks:  Right 51 blocks, Left 42 blocks  UE ROM:  RUE AROM WNL's, LUE AROM WFL's w/ decreased end range shoulder flexion (pt diagnosed with frozen shoulder) and decreased elbow extension LUE   SENSATION: WFL  MUSCLE TONE: not tested  COGNITION: Overall cognitive status: pt reports short term memory deficits  OBSERVATIONS: Hypokinesia and stiffness in neck and Lt side (shoulder, leg), decreased voice volume                                                                                                                    TREATMENT:   Pt issued PWR! Supine (basic 4) and pt return demo of each - x 10 reps for PWR! Up, x 20 reps for PWR! Rock, Twist, and Step with mod v.c's for large amplitude movements.   Pt w/ some mild  dizziness when sitting up and provided time for him to stabilize before beginning next task. Pt given cues to sit up slower, and look at target to help minimize symptoms of dizziness  Pt issued PWR! Hands and reviewed. Pt returned demo of each x 10 reps   Recommended pt purchase 3 ring binder to hold all handouts (ex's and education)   PATIENT EDUCATION: Education details: see above Person educated: Patient Education method: Programmer, multimedia, Facilities manager, Verbal  cues, and Handouts Education comprehension: verbalized understanding, returned demonstration, verbal cues required, and needs further education  HOME EXERCISE PROGRAM: 01/03/24: PWR! Supine, PWR! Hands  GOALS: Goals reviewed with patient? Yes  SHORT TERM GOALS: Target date: 01/22/24  Independent with PD specific HEP   Baseline: Goal status: IN PROGRESS   2.  Pt will verbalize understanding of adaptive strategies to increase ease with ADLS/IADLS   Baseline:  Goal status: INITIAL  3.  Pt to report greater ease with donning shoes/socks, jacket, and buttons Baseline:  Goal status: INITIAL  4.  Pt will demonstrate increased ease with dressing as evidenced by decreasing PPT#4 (don/ doff jacket) to 12 secs or less  Baseline: 16 sec Goal status: INITIAL  5.  Pt to improve LUE function as evidenced by performing 46 blocks on Box & Blocks test Baseline: 42 blocks (Rt = 51)  Goal status: INITIAL   LONG TERM GOALS: Target date: 02/21/24  Pt will verbalize understanding of ways to prevent future PD related complications and appropriate community resources prn  Baseline:  Goal status: INITIAL  2.  Pt will verbalize understanding of ways to keep thinking skills sharp and ways to compensate for STM changes in the future  Baseline:  Goal status: INITIAL  3.  Pt will demonstrate improved ease with fastening buttons as evidenced by decreasing 3 button/unbutton time by 5 seconds  Baseline: 35 sec Goal status: INITIAL  4.  Pt will  demonstrate improved fine motor coordination for ADLs as evidenced by decreasing 9 hole peg test score for Rt hand by 3 secs  Baseline: 28 sec Goal status: INITIAL  5.  Pt will demonstrate improved fine motor coordination for ADLs as evidenced by decreasing 9 hole peg test score for Lt hand by 5 secs  Baseline: 30 sec Goal status: INITIAL   ASSESSMENT:  CLINICAL IMPRESSION: Patient seen today for occupational therapy treatment for Parkinson's disease diagnosed in 2025. Pt responds well to cueing for large amplitude movements. Pt would benefit from continued skilled O.T. to address these deficits, develop PD specific HEP, and educate pt on future PD related complications in order to hopefully slow down progression of disease process.    PERFORMANCE DEFICITS: in functional skills including ADLs, IADLs, coordination, dexterity, ROM, strength, pain, flexibility, Fine motor control, Gross motor control, mobility, balance, body mechanics, endurance, decreased knowledge of use of DME, and UE functional use, cognitive skills including memory.   IMPAIRMENTS: are limiting patient from ADLs, IADLs, leisure, and social participation.   COMORBIDITIES:  may have co-morbidities  that affects occupational performance. Patient will benefit from skilled OT to address above impairments and improve overall function.  MODIFICATION OR ASSISTANCE TO COMPLETE EVALUATION: No modification of tasks or assist necessary to complete an evaluation.  OT OCCUPATIONAL PROFILE AND HISTORY: Detailed assessment: Review of records and additional review of physical, cognitive, psychosocial history related to current functional performance.  CLINICAL DECISION MAKING: Moderate - several treatment options, min-mod task modification necessary  REHAB POTENTIAL: Good  EVALUATION COMPLEXITY: Moderate    PLAN:  OT FREQUENCY: 2x/week  OT DURATION: 8 weeks  PLANNED INTERVENTIONS: 97535 self care/ADL training, 02889 therapeutic  exercise, 97530 therapeutic activity, 97112 neuromuscular re-education, 97140 manual therapy, 97010 moist heat, 97750 Physical Performance Testing, 02239 Orthotic Initial, [id number] Orthotic/Prosthetic subsequent, passive range of motion, functional mobility training, visual/perceptual remediation/compensation, patient/family education, and DME and/or AE instructions  RECOMMENDED OTHER SERVICES: none at this time (Pt has SLP evaluation scheduled)   CONSULTED AND AGREED WITH PLAN OF  CARE: Patient  PLAN FOR NEXT SESSION: Review PWR! Supine and PWR! Hands, issue coordination HEP    Burnard JINNY Roads, OT 01/03/2024, 10:23 AM

## 2024-01-03 NOTE — Therapy (Signed)
 OUTPATIENT PHYSICAL THERAPY NEURO TREATMENT - DISCHARGE SUMMARY   Patient Name: Ricardo Cooper MRN: [id number] DOB:31-Mar-1946, 78 y.o., male Today's Date: 01/03/2024   PCP: [name]., MD  REFERRING PROVIDER: Tat, Asberry RAMAN, DO  PHYSICAL THERAPY DISCHARGE SUMMARY  Visits from Start of Care: 7  Current functional level related to goals / functional outcomes: Mod I w/all ADLs without use of AD    Remaining deficits: Low fall risk, decreased spinal mobility    Education / Equipment: HEP, plan to attend [employer]    Patient agrees to discharge. Patient goals were met. Patient is being discharged due to being pleased with the current functional level.     END OF SESSION:  PT End of Session - 01/03/24 0933     Visit Number 7    Number of Visits 9    Date for PT Re-Evaluation 02/03/24   due to delay in scheduling   Authorization Type [employer]    PT Start Time 0932    PT Stop Time 1015    PT Time Calculation (min) 43 min    Equipment Utilized During Treatment Gait belt    Activity Tolerance Patient tolerated treatment well    Behavior During Therapy WFL for tasks assessed/performed           Past Medical History:  Diagnosis Date   Allergic rhinitis    Allergy    Cataract    beginnings of cataracts    CHF (congestive heart failure) (HCC)    COPD (chronic obstructive pulmonary disease) (HCC)    Diverticulosis    GERD (gastroesophageal reflux disease)    no issues with CPAP-1999   HTN (hypertension)    Hyperlipidemia    Obesity    Obstructive sleep apnea on CPAP    Paroxysmal atrial fibrillation (HCC) 2005   past trauma and infection   Skin cancer    basel cell on nose,arm,leg   Sleep apnea    with CPAP   Ulcerative colitis    Ulcerative colitis, left sided (HCC)    Past Surgical History:  Procedure Laterality Date   BACK SURGERY  2005   BELPHAROPTOSIS REPAIR     bilateral   COLONOSCOPY  2013   Dr. Albertus    FETAL  SURGERY FOR CONGENITAL HERNIA  2001   infection in chest that had to be cleaned out     Staph Infection   POLYPECTOMY     SKIN CANCER EXCISION     squamous and basal cell   TONSILLECTOMY     tubular adenoma  2013   UMBILICAL HERNIA REPAIR     VASECTOMY     Patient Active Problem List   Diagnosis Date Noted   History of colonic polyps 04/28/2022   Obesity (BMI 30-39.9) 07/02/2021   CAP (community acquired pneumonia) 03/18/2021   Plantar fasciitis 01/18/2020   Pain due to onychomycosis of toenails of both feet 09/18/2019   Multiple lung nodules 06/07/2018   Influenza with respiratory manifestation 06/06/2015   Ulcerative colitis with complication (HCC) 05/03/2011   PURE HYPERCHOLESTEROLEMIA 03/23/2010   PRECORDIAL PAIN 03/23/2010   CHEST TIGHTNESS-PRESSURE-OTHER 03/18/2010   DYSPNEA 11/19/2008   HYPERLIPIDEMIA 02/01/2008   HYPERTENSION 02/01/2008   ATRIAL FIBRILLATION 02/01/2008   Allergic rhinitis 02/01/2008   COPD GOLD II 02/01/2008   Obstructive sleep apnea 02/01/2008    ONSET DATE: 11/15/2023  REFERRING DIAG: Evonnie Asberry RAMAN, DO   THERAPY DIAG:  Unsteadiness on feet  Other lack of coordination  Abnormal posture  Other symptoms and signs involving the nervous system  Rationale for Evaluation and Treatment: Rehabilitation  SUBJECTIVE:                                                                                                                                                                                             SUBJECTIVE STATEMENT: Pt reports he mowed his yard yesterday so is a little sore from this. No falls. HEP is going well.   Pt accompanied by: Self   PERTINENT HISTORY: PMH: Parkinsonism (recently diagnosed 10/2023), CHF, COPD, HTN, HLD, hx of skin cancer, hx of back injury in 2005  Per Dr. Evonnie: left hand rest tremor for 3 months. Notes from primary care indicate that patient has had bradykinesia with shuffling gait since 2023, now with more  recent tremor.   PAIN:  Are you having pain? No, just the usual stiffness and soreness  PRECAUTIONS: None  FALLS: Has patient fallen in last 6 months? 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   COGNITION: Overall cognitive status: Within functional limits for tasks assessed   SENSATION: Pt denies numbness/tingling in legs   COORDINATION: Heel to shin: slightly slower with LLE   POSTURE: rounded shoulders   LOWER EXTREMITY MMT:    MMT Right Eval Left Eval  Hip flexion 5 5  Hip extension    Hip abduction    Hip adduction    Hip internal rotation    Hip external rotation    Knee flexion 5 5  Knee extension 5 4+  Ankle dorsiflexion 5 5  Ankle plantarflexion    Ankle inversion    Ankle eversion    (Blank rows = not tested)  All tested in sitting   BED MOBILITY:  Pt reports some difficulties, using his hand on the night stand to get out of bed, not as easy as it used to be   TRANSFERS: Sit to stand: Modified independence and SBA  Assistive device utilized: None     Stand to sit: Modified independence and SBA  Assistive device utilized: None       GAIT: Findings: Gait Characteristics: step through pattern, decreased arm  swing- Right, decreased arm swing- Left, decreased stride length, and decreased trunk rotation, Distance walked: Clinic distances, Assistive device utilized:None, Level of assistance: Modified independence, and Comments: pt with incr LUE tremor during gait   FUNCTIONAL TESTS:     [facility] PT Assessment - 01/03/24 0948       Transfers   Five time sit to stand comments  11.44s   no UE support     Mini-BESTest   Sit To Stand Normal: Comes to stand without use of hands and stabilizes independently.    Rise to Toes Normal: Stable for 3 s with maximum height.    Stand on one leg (left) Moderate: < 20 s   3.1s   Stand on one leg (right) Moderate: < 20 s   5.62s   Stand on one leg - lowest score 1    Compensatory Stepping Correction - Forward Normal: Recovers independently with a single, large step (second realignement is allowed).    Compensatory Stepping Correction - Backward Normal: Recovers independently with a single, large step    Compensatory Stepping Correction - Left Lateral Normal: Recovers independently with 1 step (crossover or lateral OK)    Compensatory Stepping Correction - Right Lateral Normal: Recovers independently with 1 step (crossover or lateral OK)    Stepping Corredtion Lateral - lowest score 2    Stance - Feet together, eyes open, firm surface  Normal: 30s    Stance - Feet together, eyes closed, foam surface  Normal: 30s   mild postural sway (R >L)   Incline - Eyes Closed Normal: Stands independently 30s and aligns with gravity    Change in Gait Speed Normal: Significantly changes walkling speed without imbalance    Walk with head turns - Horizontal Normal: performs head turns with no change in gait speed and good balance    Walk with pivot turns Normal: Turns with feet close FAST (< 3 steps) with good balance.    Step over obstacles Normal: Able to step over box with minimal change of gait speed and with good balance.    Timed UP & GO with Dual Task Normal: No noticeable change in sitting, standing or walking while backward counting when compared to TUG without    Mini-BEST total score 27      Timed Up and Go Test   Normal TUG (seconds) 8.12    Cognitive TUG (seconds) 8.41   Retro counting by 3 starting at 62, 1 incorrect number           TREATMENT:   Therapeutic Activity:  [facility] PT Assessment - 01/03/24 0948       Transfers   Five time sit to stand comments  11.44s   no UE support     Mini-BESTest   Sit To Stand Normal: Comes to stand without use of hands and stabilizes independently.    Rise to Toes Normal: Stable for 3 s with maximum height.    Stand on one leg (left) Moderate: < 20 s   3.1s   Stand on one leg (right) Moderate: < 20 s   5.62s   Stand on one leg - lowest score 1    Compensatory Stepping Correction - Forward Normal: Recovers independently with a single, large step (second realignement is allowed).    Compensatory Stepping Correction - Backward Normal: Recovers independently with a single, large step    Compensatory Stepping Correction - Left Lateral Normal: Recovers independently with 1 step (crossover or lateral OK)    Compensatory  Stepping Correction - Right Lateral Normal: Recovers independently with 1 step (crossover or lateral OK)    Stepping Corredtion Lateral - lowest score 2    Stance - Feet together, eyes open, firm surface  Normal: 30s    Stance - Feet together, eyes closed, foam surface  Normal: 30s   mild postural sway (R >L)   Incline - Eyes Closed Normal: Stands independently 30s and aligns with gravity    Change in Gait Speed Normal: Significantly changes walkling speed without imbalance    Walk with head turns - Horizontal Normal: performs head turns with no change in gait speed and good balance    Walk with pivot turns Normal: Turns with feet close FAST (< 3 steps) with good balance.    Step over obstacles Normal: Able to step over box with minimal change of gait speed and with good balance.    Timed UP & GO with Dual Task Normal: No noticeable change in sitting, standing or walking while backward counting when compared to TUG without    Mini-BEST total score 27      Timed Up and Go Test   Normal TUG (seconds) 8.12    Cognitive TUG (seconds) 8.41   Retro counting by 3 starting  at 62, 1 incorrect number        Reviewed PD community resources and pt reports he plans on checking out [employer] and Tai Chi. Educated pt on importance of exercising to maintain function and slow progression of PD. Pt reports he feels much more confident and stable now than he did when he started.  Educated pt on PD screens and plan to schedule once finished w/OT. Pt verbalized understanding.    PATIENT EDUCATION: Education details: Continue HEP, goal results, see ther act above  Person educated: Patient Education method: Medical illustrator Education comprehension: verbalized understanding and returned demonstration  HOME EXERCISE PROGRAM: Access Code: [email or web address] URL: [email or web address] Date: 12/27/2023 Prepared by: [name]  Exercises - Supine Figure 4 Piriformis Stretch  - 1 x daily - 7 x weekly - 3 sets - 30 hold  Quadruped PWR Up, Rock, Twist  Standing PWR Moves   GOALS: Goals reviewed with patient? Yes  SHORT TERM GOALS: ALL STGS = LTGS  LONG TERM GOALS: Target date: 01/02/2024  Pt will be independent with final HEP for PD specific deficits in order to build upon functional gains made in therapy.  Baseline:  Goal status: MET  2.  Pt will improve miniBEST to at least a 25/28 in order to demo decr fall risk.  Baseline: 23/28; 27/28  Goal status: MET  3.  Pt will verbalize understanding of local Parkinson's disease resources, including options for continue community fitness.  Baseline: provided on 9/2 Goal status: MET  4.  Pt will improve 5x sit<>stand to less than or equal to 13 sec to demonstrate improved functional strength and transfer efficiency.   Baseline: 15.9 seconds with no UE support; 11.44s no UE support  Goal status: MET   ASSESSMENT:  CLINICAL IMPRESSION: Emphasis of skilled PT session on LTG assessment and DC from PT. Pt has met all 4 LTGs, reporting independence w/HEP and verbalizing understanding of  local PD resources. Pt is signed up for PD Symposium on 9/19 and plans on reaching out to [employer]. Pt significantly improved his score on MiniBest to 27/28, only losing a point on single leg stance but did improve his stance time from eval. Pt also improved his time and  form on 5x STS. Pt overall doing well and is a low fall risk. Pt in agreement to DC from PT today due to high functional level and plan to join [employer].    OBJECTIVE IMPAIRMENTS: Abnormal gait, decreased activity tolerance, decreased balance, decreased coordination, decreased knowledge of condition, decreased mobility, difficulty walking, decreased strength, impaired flexibility, and postural dysfunction.   ACTIVITY LIMITATIONS: bed mobility and locomotion level  PARTICIPATION LIMITATIONS: community activity  PERSONAL FACTORS: Age, Behavior pattern, Past/current experiences, Time since onset of injury/illness/exacerbation, and 3+ comorbidities: Parkinsonism (recently diagnosed 10/2023), CHF, COPD, HTN, HLD, hx of skin cancer, hx of back injury in 2005 are also affecting patient's functional outcome.   REHAB POTENTIAL: Good  CLINICAL DECISION MAKING: Stable/uncomplicated  EVALUATION COMPLEXITY: Low  PLAN:  PT FREQUENCY: 2x/week  PT DURATION: 8 weeks - due to delay in scheduling, anticipate just 4 weeks   PLANNED INTERVENTIONS: 97164- PT Re-evaluation, 97110-Therapeutic exercises, 97530- Therapeutic activity, 97112- Neuromuscular re-education, 97535- Self Care, [id number]- Manual therapy, Patient/Family education, Balance training, and Stair training   Liban Guedes E Eather Chaires, PT,DPT 01/03/2024, 10:22 AM

## 2024-01-03 NOTE — Patient Instructions (Addendum)
 PWR! Hand Exercises Perform each exercise at least 10 repetitions 1x/day, and PWR! PUSH throughout the day when you are having trouble using your hands (picking up small objects, writing, eating, typing, buttoning, etc.). ** Make each movement big and deliberate; feel the movement.  PWR! UP: Fists to open fingers BIG  PWR! Rock:  Move wrists up and down [employer]! Twist: Twist palms up and down BIG  PWR! Step: Step your thumb to index finger while keeping other fingers straight. Flick fingers out BIG (thumb out/straighten fingers). Repeat with other fingers.  PWR! PUSH: Push hands out BIG. Elbows straight, wrists up, fingers open and spread apart BIG.

## 2024-01-05 ENCOUNTER — Ambulatory Visit: Admitting: Physical Therapy

## 2024-01-05 ENCOUNTER — Ambulatory Visit: Admitting: Occupational Therapy

## 2024-01-05 ENCOUNTER — Encounter: Payer: Self-pay | Admitting: Occupational Therapy

## 2024-01-05 DIAGNOSIS — R2681 Unsteadiness on feet: Secondary | ICD-10-CM | POA: Diagnosis not present

## 2024-01-05 DIAGNOSIS — R293 Abnormal posture: Secondary | ICD-10-CM

## 2024-01-05 DIAGNOSIS — R29818 Other symptoms and signs involving the nervous system: Secondary | ICD-10-CM

## 2024-01-05 DIAGNOSIS — R278 Other lack of coordination: Secondary | ICD-10-CM

## 2024-01-05 DIAGNOSIS — R29898 Other symptoms and signs involving the musculoskeletal system: Secondary | ICD-10-CM

## 2024-01-05 NOTE — Patient Instructions (Signed)
 Coordination Exercises  Perform the following exercises for 10-15 minutes 1 times per day. Perform with both hand(s). Perform using big movements.  Flipping Cards: Place deck of cards on the table. Flip cards over by opening your hand big to grasp and then turn your palm up big.  Deal cards: Hold 1/2 or whole deck in your hand. Use thumb to push card off top of deck with one big push.  Rotate ball with fingertips: Pick up with fingers/thumb and move as much as you can with each turn/movement (clockwise and counter-clockwise).  Toss ball in the air and catch with the same hand: Toss big/high.  Rotate 2 golf balls in your hand: Both directions.  Pick up 5 coins one at a time and hold in palm. Then, move coins from palm to fingertips one at a time to stack.  Practice writing: Slow down, write big, and focus on forming each letter.

## 2024-01-05 NOTE — Therapy (Signed)
 OUTPATIENT OCCUPATIONAL THERAPY [facility]  Patient Name: Ricardo Cooper MRN: [id number] DOB:10/07/1945, 78 y.o., male Today's Date: 01/05/2024  PCP: [name], MD REFERRING PROVIDER: Evonnie Asberry RAMAN, DO  END OF SESSION:  OT End of Session - 01/05/24 1124     Visit Number 3    Number of Visits 17    Date for OT Re-Evaluation 02/21/24    Authorization Type [employer]    Progress Note Due on Visit 10    OT Start Time 1100    OT Stop Time 1145    OT Time Calculation (min) 45 min    Activity Tolerance Patient tolerated treatment well    Behavior During Therapy WFL for tasks assessed/performed          Past Medical History:  Diagnosis Date   Allergic rhinitis    Allergy    Cataract    beginnings of cataracts    CHF (congestive heart failure) (HCC)    COPD (chronic obstructive pulmonary disease) (HCC)    Diverticulosis    GERD (gastroesophageal reflux disease)    no issues with CPAP-1999   HTN (hypertension)    Hyperlipidemia    Obesity    Obstructive sleep apnea on CPAP    Paroxysmal atrial fibrillation (HCC) 2005   past trauma and infection   Skin cancer    basel cell on nose,arm,leg   Sleep apnea    with CPAP   Ulcerative colitis    Ulcerative colitis, left sided (HCC)    Past Surgical History:  Procedure Laterality Date   BACK SURGERY  2005   BELPHAROPTOSIS REPAIR     bilateral   COLONOSCOPY  2013   Dr. Albertus    FETAL SURGERY FOR CONGENITAL HERNIA  2001   infection in chest that had to be cleaned out     Staph Infection   POLYPECTOMY     SKIN CANCER EXCISION     squamous and basal cell   TONSILLECTOMY     tubular adenoma  2013   UMBILICAL HERNIA REPAIR     VASECTOMY     Patient Active Problem List   Diagnosis Date Noted   History of colonic polyps 04/28/2022   Obesity (BMI 30-39.9) 07/02/2021   CAP (community acquired pneumonia) 03/18/2021   Plantar fasciitis 01/18/2020   Pain due to onychomycosis of toenails  of both feet 09/18/2019   Multiple lung nodules 06/07/2018   Influenza with respiratory manifestation 06/06/2015   Ulcerative colitis with complication (HCC) 05/03/2011   PURE HYPERCHOLESTEROLEMIA 03/23/2010   PRECORDIAL PAIN 03/23/2010   CHEST TIGHTNESS-PRESSURE-OTHER 03/18/2010   DYSPNEA 11/19/2008   HYPERLIPIDEMIA 02/01/2008   HYPERTENSION 02/01/2008   ATRIAL FIBRILLATION 02/01/2008   Allergic rhinitis 02/01/2008   COPD GOLD II 02/01/2008   Obstructive sleep apnea 02/01/2008    ONSET DATE: 12/06/2023  REFERRING DIAG: G20.A1 (ICD-10-CM) - Parkinson's disease without dyskinesia or fluctuating manifestations (HCC)  THERAPY DIAG:  Abnormal posture  Other symptoms and signs involving the nervous system  Other symptoms and signs involving the musculoskeletal system  Other lack of coordination  Rationale for Evaluation and Treatment: Rehabilitation  SUBJECTIVE:   SUBJECTIVE STATEMENT: I have soreness/stiffness in Lt shoulder.  The PWR! Twist was a little better - didn't make me as dizzy with the strategies, but I still got dizzy sitting up  Pt accompanied by: self  PERTINENT HISTORY: PMH: Parkinsonism (recently diagnosed 10/2023), CHF, COPD, HTN, HLD, hx of skin cancer, hx of back injury in 2005  Per Dr. Evonnie: left hand rest tremor for 3 months. Notes from primary care indicate that patient has had bradykinesia with shuffling gait since 2023, now with more recent tremor.   PRECAUTIONS: None  WEIGHT BEARING RESTRICTIONS: No  PAIN:  Are you having pain? No  FALLS: Has patient fallen in last 6 months? No  LIVING ENVIRONMENT: Lives with: lives with their spouse Lives in: House/apartment Stairs: Yes: External: 3-4 steps; on right going up Has following equipment at home: Grab bars   PLOF: Independent and Vocation/Vocational requirements: was a Pharmacist, community for 25 years  Notes has a hard time with getting his socks on, esp on the L side   PATIENT GOALS:  Wants to improve balance, improve with ADLS  OBJECTIVE:  Note: Objective measures were completed at Evaluation unless otherwise noted.  HAND DOMINANCE: Right  ADLs: Overall ADLs: difficulty donning sock Lt side Transfers/ambulation related to ADLs: independent Eating: Independent Grooming: independent UB Dressing: more difficulty with jacket and buttons LB Dressing: more difficulty w/ Lt shoe and sock Toileting: difficulty w/ wiping in back  Bathing: mod I w/ walk in shower and 1 grab bar Tub Shower transfers: mod I  Equipment: Grab bars and built in shower seat  IADLs: Shopping: pt/wife go together Light housekeeping: pt doing Presenter, broadcasting and taking out trash Meal Prep: independent  Community mobility: difficulty turning neck to change lanes, etc.  Medication management: independent  Financial management: wife does Handwriting: 90% legible and writes quickly (mild micrographia if writing longer)   MOBILITY STATUS: Independent  POSTURE COMMENTS:  rounded shoulders and increased thoracic kyphosis   FUNCTIONAL OUTCOME MEASURES: Fastening/unfastening 3 buttons: 35.22 sec.  Physical performance test: PPT#2 (simulated eating) 11.66 & PPT#4 (donning/doffing jacket): 16.32 (w/ hospital gown)   COORDINATION: 9 Hole Peg test: Right: 28.57 sec; Left: 30.03 sec Box and Blocks:  Right 51 blocks, Left 42 blocks  UE ROM:  RUE AROM WNL's, LUE AROM WFL's w/ decreased end range shoulder flexion (pt diagnosed with frozen shoulder) and decreased elbow extension LUE   SENSATION: WFL  MUSCLE TONE: not tested  COGNITION: Overall cognitive status: pt reports short term memory deficits  OBSERVATIONS: Hypokinesia and stiffness in neck and Lt side (shoulder, leg), decreased voice volume                                                                                                                    TREATMENT:   Reviewed PWR! Supine (basic 4) and pt return demo of each - x 10 reps with  slower movements and visual fixation with PWR! Twist to minimize dizziness. Pt reports this did help minimize dizziness. Pt still had quite a bit of dizziness sitting up though.   Reviewed PWR! Hands and reviewed. Pt returned demo of each x 10 reps   Issued coordination HEP  - pt cued for large amplitude movements. See pt instructions for details.   PATIENT EDUCATION: Education details: see above Person educated: Patient Education method: Explanation, Demonstration, Verbal cues, and  Handouts Education comprehension: verbalized understanding, returned demonstration, verbal cues required, and needs further education  HOME EXERCISE PROGRAM: 01/03/24: PWR! Supine, PWR! Hands 01/05/24: coordination HEP   GOALS: Goals reviewed with patient? Yes  SHORT TERM GOALS: Target date: 01/22/24  Independent with PD specific HEP   Baseline: Goal status: IN PROGRESS   2.  Pt will verbalize understanding of adaptive strategies to increase ease with ADLS/IADLS   Baseline:  Goal status: INITIAL  3.  Pt to report greater ease with donning shoes/socks, jacket, and buttons Baseline:  Goal status: INITIAL  4.  Pt will demonstrate increased ease with dressing as evidenced by decreasing PPT#4 (don/ doff jacket) to 12 secs or less  Baseline: 16 sec Goal status: INITIAL  5.  Pt to improve LUE function as evidenced by performing 46 blocks on Box & Blocks test Baseline: 42 blocks (Rt = 51)  Goal status: INITIAL   LONG TERM GOALS: Target date: 02/21/24  Pt will verbalize understanding of ways to prevent future PD related complications and appropriate community resources prn  Baseline:  Goal status: INITIAL  2.  Pt will verbalize understanding of ways to keep thinking skills sharp and ways to compensate for STM changes in the future  Baseline:  Goal status: INITIAL  3.  Pt will demonstrate improved ease with fastening buttons as evidenced by decreasing 3 button/unbutton time by 5 seconds  Baseline:  35 sec Goal status: INITIAL  4.  Pt will demonstrate improved fine motor coordination for ADLs as evidenced by decreasing 9 hole peg test score for Rt hand by 3 secs  Baseline: 28 sec Goal status: INITIAL  5.  Pt will demonstrate improved fine motor coordination for ADLs as evidenced by decreasing 9 hole peg test score for Lt hand by 5 secs  Baseline: 30 sec Goal status: INITIAL   ASSESSMENT:  CLINICAL IMPRESSION: Patient seen today for occupational therapy treatment for Parkinson's disease diagnosed in 2025. Pt responds well to cueing for large amplitude movements, but does have dizziness w/ supine PWR! Twist and sitting up. Pt would benefit from continued skilled O.T. to address these deficits, develop PD specific HEP, and educate pt on future PD related complications in order to hopefully slow down progression of disease process.    PERFORMANCE DEFICITS: in functional skills including ADLs, IADLs, coordination, dexterity, ROM, strength, pain, flexibility, Fine motor control, Gross motor control, mobility, balance, body mechanics, endurance, decreased knowledge of use of DME, and UE functional use, cognitive skills including memory.   IMPAIRMENTS: are limiting patient from ADLs, IADLs, leisure, and social participation.   COMORBIDITIES:  may have co-morbidities  that affects occupational performance. Patient will benefit from skilled OT to address above impairments and improve overall function.  MODIFICATION OR ASSISTANCE TO COMPLETE EVALUATION: No modification of tasks or assist necessary to complete an evaluation.  OT OCCUPATIONAL PROFILE AND HISTORY: Detailed assessment: Review of records and additional review of physical, cognitive, psychosocial history related to current functional performance.  CLINICAL DECISION MAKING: Moderate - several treatment options, min-mod task modification necessary  REHAB POTENTIAL: Good  EVALUATION COMPLEXITY: Moderate    PLAN:  OT FREQUENCY:  2x/week  OT DURATION: 8 weeks  PLANNED INTERVENTIONS: 97535 self care/ADL training, 02889 therapeutic exercise, 97530 therapeutic activity, 97112 neuromuscular re-education, 97140 manual therapy, 97010 moist heat, 97750 Physical Performance Testing, 02239 Orthotic Initial, [id number] Orthotic/Prosthetic subsequent, passive range of motion, functional mobility training, visual/perceptual remediation/compensation, patient/family education, and DME and/or AE instructions  RECOMMENDED OTHER SERVICES: none at this time (  Pt has SLP evaluation scheduled)   CONSULTED AND AGREED WITH PLAN OF CARE: Patient  PLAN FOR NEXT SESSION: PWR! Sitting, ADL strategies including handwriting strategies   Burnard JINNY Roads, OT 01/05/2024, 11:24 AM

## 2024-01-10 ENCOUNTER — Encounter: Payer: Self-pay | Admitting: Occupational Therapy

## 2024-01-10 ENCOUNTER — Ambulatory Visit: Admitting: Occupational Therapy

## 2024-01-10 ENCOUNTER — Ambulatory Visit: Admitting: Physical Therapy

## 2024-01-10 DIAGNOSIS — R2681 Unsteadiness on feet: Secondary | ICD-10-CM | POA: Diagnosis not present

## 2024-01-10 DIAGNOSIS — R293 Abnormal posture: Secondary | ICD-10-CM

## 2024-01-10 DIAGNOSIS — R29898 Other symptoms and signs involving the musculoskeletal system: Secondary | ICD-10-CM

## 2024-01-10 DIAGNOSIS — R29818 Other symptoms and signs involving the nervous system: Secondary | ICD-10-CM

## 2024-01-10 DIAGNOSIS — R278 Other lack of coordination: Secondary | ICD-10-CM

## 2024-01-10 NOTE — Therapy (Signed)
 OUTPATIENT OCCUPATIONAL THERAPY [facility]  Patient Name: Ricardo Cooper MRN: [id number] DOB:[birth year], 78 y.o., male Today's Date: 01/10/2024  PCP: [name], MD REFERRING PROVIDER: Evonnie Asberry RAMAN, DO  END OF SESSION:  OT End of Session - 01/10/24 1109     Visit Number 4    Number of Visits 17    Date for OT Re-Evaluation 02/21/24    Authorization Type [employer]    Progress Note Due on Visit 10    OT Start Time 1102    OT Stop Time 1145    OT Time Calculation (min) 43 min    Activity Tolerance Patient tolerated treatment well    Behavior During Therapy WFL for tasks assessed/performed          Past Medical History:  Diagnosis Date   Allergic rhinitis    Allergy    Cataract    beginnings of cataracts    CHF (congestive heart failure) (HCC)    COPD (chronic obstructive pulmonary disease) (HCC)    Diverticulosis    GERD (gastroesophageal reflux disease)    no issues with CPAP-1999   HTN (hypertension)    Hyperlipidemia    Obesity    Obstructive sleep apnea on CPAP    Paroxysmal atrial fibrillation (HCC) 2005   past trauma and infection   Skin cancer    basel cell on nose,arm,leg   Sleep apnea    with CPAP   Ulcerative colitis    Ulcerative colitis, left sided (HCC)    Past Surgical History:  Procedure Laterality Date   BACK SURGERY  2005   BELPHAROPTOSIS REPAIR     bilateral   COLONOSCOPY  2013   Dr. Albertus    FETAL SURGERY FOR CONGENITAL HERNIA  2001   infection in chest that had to be cleaned out     Staph Infection   POLYPECTOMY     SKIN CANCER EXCISION     squamous and basal cell   TONSILLECTOMY     tubular adenoma  2013   UMBILICAL HERNIA REPAIR     VASECTOMY     Patient Active Problem List   Diagnosis Date Noted   History of colonic polyps 04/28/2022   Obesity (BMI 30-39.9) 07/02/2021   CAP (community acquired pneumonia) 03/18/2021   Plantar fasciitis 01/18/2020   Pain due to onychomycosis of toenails  of both feet 09/18/2019   Multiple lung nodules 06/07/2018   Influenza with respiratory manifestation 06/06/2015   Ulcerative colitis with complication (HCC) 05/03/2011   PURE HYPERCHOLESTEROLEMIA 03/23/2010   PRECORDIAL PAIN 03/23/2010   CHEST TIGHTNESS-PRESSURE-OTHER 03/18/2010   DYSPNEA 11/19/2008   HYPERLIPIDEMIA 02/01/2008   HYPERTENSION 02/01/2008   ATRIAL FIBRILLATION 02/01/2008   Allergic rhinitis 02/01/2008   COPD GOLD II 02/01/2008   Obstructive sleep apnea 02/01/2008    ONSET DATE: 12/06/2023  REFERRING DIAG: G20.A1 (ICD-10-CM) - Parkinson's disease without dyskinesia or fluctuating manifestations (HCC)  THERAPY DIAG:  Abnormal posture  Other symptoms and signs involving the nervous system  Other symptoms and signs involving the musculoskeletal system  Other lack of coordination  Unsteadiness on feet  Rationale for Evaluation and Treatment: Rehabilitation  SUBJECTIVE:   SUBJECTIVE STATEMENT: I have some stiffness in hands, but my movements in general are getting better. If I go slow with PWR! Twist (supine), the dizziness is better.  Denies pain or recent falls  Pt accompanied by: self  PERTINENT HISTORY: PMH: Parkinsonism (recently diagnosed 10/2023), CHF, COPD, HTN, HLD, hx of skin cancer, hx  of back injury in 2005   Per Dr. Evonnie: left hand rest tremor for 3 months. Notes from primary care indicate that patient has had bradykinesia with shuffling gait since 2023, now with more recent tremor.   PRECAUTIONS: None  WEIGHT BEARING RESTRICTIONS: No  PAIN:  Are you having pain? No  FALLS: Has patient fallen in last 6 months? 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  HAND DOMINANCE: Right  ADLs: Overall ADLs: difficulty donning sock Lt side Transfers/ambulation related to ADLs: independent Eating: Independent Grooming: independent UB Dressing: more difficulty with jacket and buttons LB Dressing: more difficulty w/ Lt shoe and sock Toileting: difficulty w/ wiping in back  Bathing: mod I w/ walk in shower and 1 grab bar Tub Shower transfers: mod I  Equipment: Grab bars and built in shower seat  IADLs: Shopping: pt/wife go together Light housekeeping: pt doing Presenter, broadcasting and taking out trash Meal Prep: independent  Community mobility: difficulty turning neck to change lanes, etc.  Medication management: independent  Financial management: wife does Handwriting: 90% legible and writes quickly (mild micrographia if writing longer)   MOBILITY STATUS: Independent  POSTURE COMMENTS:  rounded shoulders and increased thoracic kyphosis   FUNCTIONAL OUTCOME MEASURES: Fastening/unfastening 3 buttons: 35.22 sec.  Physical performance test: PPT#2 (simulated eating) 11.66 & PPT#4 (donning/doffing jacket): 16.32 (w/ hospital gown)   COORDINATION: 9 Hole Peg test: Right: 28.57 sec; Left: 30.03 sec Box and Blocks:  Right 51 blocks, Left 42 blocks  UE ROM:  RUE AROM WNL's, LUE AROM WFL's w/ decreased end range shoulder flexion (pt diagnosed with frozen shoulder) and decreased elbow extension LUE   SENSATION: WFL  MUSCLE TONE: not tested  COGNITION: Overall cognitive status: pt reports short term memory deficits  OBSERVATIONS: Hypokinesia and stiffness in neck and Lt side (shoulder, leg), decreased voice volume                                                                                                                    TREATMENT:   Issued PWR! Sitting (basic 4) and  returned demo x 20 reps with min cues for large amplitude movements.   Pt issued handwriting strategies and reviewed. Pt able to write in cursive at 90% or higher legibility w/ regular pen,  but benefits from cues to slow down and write big.   Began reviewing ADL strategies - practiced getting hand fully around cylindrical objects and educating pt on getting hand fully around clothes/fabric to pull down shirt/pull up pants,etc - will further discuss ADL strategies next session  PATIENT EDUCATION: Education details: see above Person educated: Patient Education method: Explanation, Demonstration, Verbal cues, and Handouts Education comprehension: verbalized understanding, returned demonstration, verbal cues required, and needs further education  HOME EXERCISE PROGRAM: 01/03/24: PWR! Supine, PWR! Hands 01/05/24: coordination HEP  01/10/24: PWR! Sitting, handwriting strategies, ADL strategies  GOALS: Goals reviewed with patient? Yes  SHORT TERM GOALS: Target date: 01/22/24  Independent with PD specific HEP   Baseline: Goal status: IN PROGRESS   2.  Pt will verbalize understanding of adaptive strategies to increase ease with ADLS/IADLS   Baseline:  Goal status: IN PROGRESS  3.  Pt to report greater ease with donning shoes/socks, jacket, and buttons Baseline:  Goal status: INITIAL  4.  Pt will demonstrate increased ease with dressing as evidenced by decreasing PPT#4 (don/ doff jacket) to 12 secs or less  Baseline: 16 sec Goal status: INITIAL  5.  Pt to improve LUE function as evidenced by performing 46 blocks on Box & Blocks test Baseline: 42 blocks (Rt = 51)  Goal status: INITIAL   LONG TERM GOALS: Target date: 02/21/24  Pt will verbalize understanding of ways to prevent future PD related complications and appropriate community resources prn  Baseline:  Goal status: INITIAL  2.  Pt will verbalize understanding of ways to keep thinking skills sharp and ways to compensate for STM  changes in the future  Baseline:  Goal status: INITIAL  3.  Pt will demonstrate improved ease with fastening buttons as evidenced by decreasing 3 button/unbutton time by 5 seconds  Baseline: 35 sec Goal status: INITIAL  4.  Pt will demonstrate improved fine motor coordination for ADLs as evidenced by decreasing 9 hole peg test score for Rt hand by 3 secs  Baseline: 28 sec Goal status: INITIAL  5.  Pt will demonstrate improved fine motor coordination for ADLs as evidenced by decreasing 9 hole peg test score for Lt hand by 5 secs  Baseline: 30 sec Goal status: INITIAL   ASSESSMENT:  CLINICAL IMPRESSION: Patient seen today for occupational therapy treatment for Parkinson's disease diagnosed in 2025. Pt responds well to cueing for large amplitude movements, and doing well with PWR! Sitting today. Pt would benefit from continued skilled O.T. to address these deficits, develop PD specific HEP, and educate pt on future PD related complications in order to hopefully slow down progression of disease process.    PERFORMANCE DEFICITS: in functional skills including ADLs, IADLs, coordination, dexterity, ROM, strength, pain, flexibility, Fine motor control, Gross motor control, mobility, balance, body mechanics, endurance, decreased knowledge of use of DME, and UE functional use, cognitive skills including memory.   IMPAIRMENTS: are limiting patient from ADLs, IADLs, leisure, and social participation.   COMORBIDITIES:  may have co-morbidities  that affects occupational performance. Patient will benefit from skilled OT to address above impairments and improve overall function.  MODIFICATION OR ASSISTANCE TO COMPLETE EVALUATION: No modification of tasks or assist necessary to complete an evaluation.  OT OCCUPATIONAL PROFILE AND HISTORY: Detailed assessment: Review of records and additional review of physical, cognitive, psychosocial history related to current functional performance.  CLINICAL  DECISION MAKING: Moderate - several treatment options, min-mod task modification necessary  REHAB POTENTIAL: Good  EVALUATION COMPLEXITY: Moderate    PLAN:  OT FREQUENCY: 2x/week  OT DURATION: 8 weeks  PLANNED INTERVENTIONS: 97535 self care/ADL training, 02889 therapeutic exercise, 97530 therapeutic activity, 97112 neuromuscular re-education, 97140 manual therapy, 97010 moist heat, 97750 Physical Performance Testing, 02239 Orthotic Initial, [id number] Orthotic/Prosthetic subsequent, passive range of motion,  functional mobility training, visual/perceptual remediation/compensation, patient/family education, and DME and/or AE instructions  RECOMMENDED OTHER SERVICES: none at this time (Pt has SLP evaluation scheduled)   CONSULTED AND AGREED WITH PLAN OF CARE: Patient  PLAN FOR NEXT SESSION: Review PWR! Sitting, ADL strategies - practice jacket, opening containers, etc., develop PD ex chart   Burnard JINNY Roads, OT 01/10/2024, 11:10 AM

## 2024-01-10 NOTE — Patient Instructions (Addendum)
 Suggestions for Handwriting Changes Many people with Parkinson's notice changes in their handwriting.  Handwriting often becomes small and cramped, and can become more difficult to control when writing for longer periods of time.  This handwriting change is called micrographia.  Why does micrographia occur?  Parkinson's can cause slowing of movement, and feelings of muscle stiffness in the hands and fingers.  Loss of automatic motion also affects the easy, flowing motion of handwriting.  This can impact even simple writing tasks such as signing your name or writing a shopping list.  Attempts to write quickly without thinking about forming each letter contributes to small, cramped handwriting, and may cause the hand to develop a feeling of tightness.  How can I make writing easier?  Make a deliberate effort to form each letter.  This can be hard to do at first, but is very effective in improving size and legibility of handwriting.  Use a pen grip (round or triangular shaped rubber or foam cylinders available at stationery stores or where writing materials are found) or a larger size pen to keep your hand more relaxed.  Try printing rather than writing in a cursive style. Printing causes you to pause briefly between each letter, keeping writing more legible.   Using lined paper may provide a visual target to keep all letters big when writing.   A ballpoint pen typically works better than felt tip or rolling writer/gel styles.  Rest your hand if it begins to feel tight.  Pause briefly when you see your handwriting becoming smaller.  Avoid hurrying or trying to write long passages if you are feeling stressed or fatigued.  Practice helps!  Remind yourself to slow down, aim big, and pause often!  Perform flicks/PWR! Hands if your hand feels tight, your writing gets smaller, before you start writing, or if tremors increase.  Involving your team: An occupational therapist  can provide assessment and individual recommendations for improvement of your handwriting.  This handout was adapted from [email or web address]        Performing Daily Activities with Big Movements  Pick at least 2 activities a day and perform with BIG, DELIBERATE movements/effort.  Try different activities each day. This can make the activity easier and turn daily activities into exercise to prevent problems in the future!  If you are standing during the activity, make sure to keep feet apart and stand with good/big/posture.  Examples: Dressing  Pull-over shirt:  good posture, bring shirt to head (don't bring head down), deliberately push arms into sleeves Jacket:  stand with feet apart, deliberately push arms into sleeves Underwear/Pants/Shoes/Socks:  Sit, lean forward, and push each foot in deliberately 1 at a time Open hands to pull down shirt/put on socks/pull up pants--get more material in your hand  Buttoning - Open hands big before fastening each button, deliberate movement (angry buttons--push button through hole), unfasten by using pull-push method   Bathing - Wash/dry with long strokes  Brushing your teeth - Big, slow movements  Cutting food - Long deliberate cuts, put tip of knife down in front of food  Eating - Hold utensil in the middle (not the end), hold fork straight up and down to stab food  Picking up a cup/bottle - Open hand up big and get object all the way in palm  Opening jar/bottle - Move as much as you can with each turn, twist wrist  Putting on seatbelt - Feet apart, twist when  reaching across body, look at where you are reaching  Hanging up clothes/getting clothes down from closet - Reach with big effort, open hand, straighten elbow  Putting away groceries/dishes - Reach with big effort  Wiping counter/table - Move in big, long strokes, open hand  Stirring while cooking - Exaggerate movement  Cleaning windows - Feet apart,  move in big, long strokes  Sweeping/Raking- Feet apart and one foot in front of the other, move arms in big, long strokes  Vacuuming - Feet apart and one in front of the other, push with big movement  Folding clothes - Exaggerate arm movements  Washing car - Move in big, long strokes, feet apart  Changing light bulb or Using a screwdriver - Move as much as you can with each turn, twist wrist  Walking into a store/restaurant - Walk with big steps, good posture, swing arms if able  Standing up from a chair/recliner/sofa - Scoot forward, lean forward, and stand with big effort  Picking up something from floor/reaching in low cabinet - Get close to object, position feet apart and one in front of the other

## 2024-01-12 ENCOUNTER — Encounter: Payer: Self-pay | Admitting: Occupational Therapy

## 2024-01-12 ENCOUNTER — Ambulatory Visit: Admitting: Physical Therapy

## 2024-01-12 ENCOUNTER — Ambulatory Visit: Admitting: Occupational Therapy

## 2024-01-12 DIAGNOSIS — R29898 Other symptoms and signs involving the musculoskeletal system: Secondary | ICD-10-CM

## 2024-01-12 DIAGNOSIS — R293 Abnormal posture: Secondary | ICD-10-CM

## 2024-01-12 DIAGNOSIS — R278 Other lack of coordination: Secondary | ICD-10-CM

## 2024-01-12 DIAGNOSIS — R29818 Other symptoms and signs involving the nervous system: Secondary | ICD-10-CM

## 2024-01-12 DIAGNOSIS — R2681 Unsteadiness on feet: Secondary | ICD-10-CM | POA: Diagnosis not present

## 2024-01-12 NOTE — Patient Instructions (Addendum)
  To DON jacket:   1) Wide stance 2) Hold facing tag out with hands big at sleeves 3) Swing around big like a cape to RT side 4) Punch Rt arm in as you continue to pull behind you with Lt hand  5) Punch Lt arm in   To DOFF jacket:   1) Wide stance 2) Hold hands big at belly button level 3) Turn and twist to Rt to take off same shoulder 4) Turn and twist to Lt to take off same shoulder 5) Reach big behind you and grab cuff of sleeve BIG and yank off one hand, then the other                       (Exercise) Monday Tuesday Wednesday Thursday Friday Saturday Sunday   PWR! Supine           Hip stretch           PWR! Hands           Coordination           PWR! All 4'S           PWR! Sitting           PWR! Standing

## 2024-01-12 NOTE — Therapy (Signed)
 OUTPATIENT OCCUPATIONAL THERAPY PARKINSON'S TREATMENT  Patient Name: Ricardo Cooper MRN: [id number] DOB:04/19/1946, 78 y.o., male Today's Date: 01/12/2024  PCP: [name], MD REFERRING PROVIDER: Evonnie Asberry RAMAN, DO  END OF SESSION:  OT End of Session - 01/12/24 1236     Visit Number 5    Number of Visits 17    Date for Recertification  02/21/24    Authorization Type [employer]    Progress Note Due on Visit 10    OT Start Time 1235    OT Stop Time 1325    OT Time Calculation (min) 50 min    Activity Tolerance Patient tolerated treatment well    Behavior During Therapy WFL for tasks assessed/performed          Past Medical History:  Diagnosis Date   Allergic rhinitis    Allergy    Cataract    beginnings of cataracts    CHF (congestive heart failure) (HCC)    COPD (chronic obstructive pulmonary disease) (HCC)    Diverticulosis    GERD (gastroesophageal reflux disease)    no issues with CPAP-1999   HTN (hypertension)    Hyperlipidemia    Obesity    Obstructive sleep apnea on CPAP    Paroxysmal atrial fibrillation (HCC) 2005   past trauma and infection   Skin cancer    basel cell on nose,arm,leg   Sleep apnea    with CPAP   Ulcerative colitis    Ulcerative colitis, left sided (HCC)    Past Surgical History:  Procedure Laterality Date   BACK SURGERY  2005   BELPHAROPTOSIS REPAIR     bilateral   COLONOSCOPY  2013   Dr. Albertus    FETAL SURGERY FOR CONGENITAL HERNIA  2001   infection in chest that had to be cleaned out     Staph Infection   POLYPECTOMY     SKIN CANCER EXCISION     squamous and basal cell   TONSILLECTOMY     tubular adenoma  2013   UMBILICAL HERNIA REPAIR     VASECTOMY     Patient Active Problem List   Diagnosis Date Noted   History of colonic polyps 04/28/2022   Obesity (BMI 30-39.9) 07/02/2021   CAP (community acquired pneumonia) 03/18/2021   Plantar fasciitis 01/18/2020   Pain due to onychomycosis of toenails  of both feet 09/18/2019   Multiple lung nodules 06/07/2018   Influenza with respiratory manifestation 06/06/2015   Ulcerative colitis with complication (HCC) 05/03/2011   PURE HYPERCHOLESTEROLEMIA 03/23/2010   PRECORDIAL PAIN 03/23/2010   CHEST TIGHTNESS-PRESSURE-OTHER 03/18/2010   DYSPNEA 11/19/2008   HYPERLIPIDEMIA 02/01/2008   HYPERTENSION 02/01/2008   ATRIAL FIBRILLATION 02/01/2008   Allergic rhinitis 02/01/2008   COPD GOLD II 02/01/2008   Obstructive sleep apnea 02/01/2008    ONSET DATE: 12/06/2023  REFERRING DIAG: G20.A1 (ICD-10-CM) - Parkinson's disease without dyskinesia or fluctuating manifestations (HCC)  THERAPY DIAG:  Abnormal posture  Other symptoms and signs involving the nervous system  Other symptoms and signs involving the musculoskeletal system  Other lack of coordination  Unsteadiness on feet  Rationale for Evaluation and Treatment: Rehabilitation  SUBJECTIVE:   SUBJECTIVE STATEMENT: I have some soreness in shoulders but no pain Pt accompanied by: self  PERTINENT HISTORY: PMH: Parkinsonism (recently diagnosed 10/2023), CHF, COPD, HTN, HLD, hx of skin cancer, hx of back injury in 2005   Per Dr. Evonnie: left hand rest tremor for 3 months. Notes from primary care indicate that patient  has had bradykinesia with shuffling gait since 2023, now with more recent tremor.   PRECAUTIONS: None  WEIGHT BEARING RESTRICTIONS: No  PAIN:  Are you having pain? No  FALLS: Has patient fallen in last 6 months? 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  HAND DOMINANCE: Right  ADLs: Overall ADLs: difficulty donning sock Lt side Transfers/ambulation related to ADLs: independent Eating: Independent Grooming: independent UB Dressing: more difficulty with jacket and buttons LB Dressing: more difficulty w/ Lt shoe and sock Toileting: difficulty w/ wiping in back  Bathing: mod I w/ walk in shower and 1 grab bar Tub Shower transfers: mod I  Equipment: Grab bars and built in shower seat  IADLs: Shopping: pt/wife go together Light housekeeping: pt doing Presenter, broadcasting and taking out trash Meal Prep: independent  Community mobility: difficulty turning neck to change lanes, etc.  Medication management: independent  Financial management: wife does Handwriting: 90% legible and writes quickly (mild micrographia if writing longer)   MOBILITY STATUS: Independent  POSTURE COMMENTS:  rounded shoulders and increased thoracic kyphosis   FUNCTIONAL OUTCOME MEASURES: Fastening/unfastening 3 buttons: 35.22 sec.  Physical performance test: PPT#2 (simulated eating) 11.66 & PPT#4 (donning/doffing jacket): 16.32 (w/ hospital gown)   COORDINATION: 9 Hole Peg test: Right: 28.57 sec; Left: 30.03 sec Box and Blocks:  Right 51 blocks, Left 42 blocks  UE ROM:  RUE AROM WNL's, LUE AROM WFL's w/ decreased end range shoulder flexion (pt diagnosed with frozen shoulder) and decreased elbow extension LUE   SENSATION: WFL  MUSCLE TONE: not tested  COGNITION: Overall cognitive status: pt reports short term memory deficits  OBSERVATIONS: Hypokinesia and stiffness in neck and Lt side (shoulder, leg), decreased voice volume                                                                                                                    TREATMENT:   Practiced donning/doffing suit jacket/blazer several ways to see which is most efficient for him. Pt did best with cape method to Rt side first. Pt practiced several  times   Further discussed ADL strategies and practiced opening twist lid containers w/ cues for large amplitude movements.   Issued PD exercise chart and assisted pt in filling in to help with carryover of HEP's at home  Further discussed community class/gym options - pt seems very motivated to get into some classes upon d/c from therapy  PATIENT EDUCATION: Education details: see above Person educated: Patient Education method: Programmer, multimedia, Demonstration, Verbal cues, and Handouts Education comprehension: verbalized understanding, returned demonstration, verbal cues required,  and needs further education  HOME EXERCISE PROGRAM: 01/03/24: PWR! Supine, PWR! Hands 01/05/24: coordination HEP  01/10/24: PWR! Sitting, handwriting strategies, ADL strategies 01/12/24: cape method (for jacket), PD ex chart  GOALS: Goals reviewed with patient? Yes  SHORT TERM GOALS: Target date: 01/22/24  Independent with PD specific HEP   Baseline: Goal status: IN PROGRESS   2.  Pt will verbalize understanding of adaptive strategies to increase ease with ADLS/IADLS   Baseline:  Goal status: IN PROGRESS  3.  Pt to report greater ease with donning shoes/socks, jacket, and buttons Baseline:  Goal status: INITIAL  4.  Pt will demonstrate increased ease with dressing as evidenced by decreasing PPT#4 (don/ doff jacket) to 12 secs or less  Baseline: 16 sec Goal status: INITIAL  5.  Pt to improve LUE function as evidenced by performing 46 blocks on Box & Blocks test Baseline: 42 blocks (Rt = 51)  Goal status: INITIAL   LONG TERM GOALS: Target date: 02/21/24  Pt will verbalize understanding of ways to prevent future PD related complications and appropriate community resources prn  Baseline:  Goal status: INITIAL  2.  Pt will verbalize understanding of ways to keep thinking skills sharp and ways to compensate for STM changes in the future  Baseline:  Goal status: INITIAL  3.  Pt will demonstrate  improved ease with fastening buttons as evidenced by decreasing 3 button/unbutton time by 5 seconds  Baseline: 35 sec Goal status: INITIAL  4.  Pt will demonstrate improved fine motor coordination for ADLs as evidenced by decreasing 9 hole peg test score for Rt hand by 3 secs  Baseline: 28 sec Goal status: INITIAL  5.  Pt will demonstrate improved fine motor coordination for ADLs as evidenced by decreasing 9 hole peg test score for Lt hand by 5 secs  Baseline: 30 sec Goal status: INITIAL   ASSESSMENT:  CLINICAL IMPRESSION: Patient seen today for occupational therapy treatment for Parkinson's disease diagnosed in 2025. Pt responds well to cueing for large amplitude movements, and doing well with therapy. Pt very motivated. Pt would benefit from continued skilled O.T. to address these deficits, develop PD specific HEP, and educate pt on future PD related complications in order to hopefully slow down progression of disease process.    PERFORMANCE DEFICITS: in functional skills including ADLs, IADLs, coordination, dexterity, ROM, strength, pain, flexibility, Fine motor control, Gross motor control, mobility, balance, body mechanics, endurance, decreased knowledge of use of DME, and UE functional use, cognitive skills including memory.   IMPAIRMENTS: are limiting patient from ADLs, IADLs, leisure, and social participation.   COMORBIDITIES:  may have co-morbidities  that affects occupational performance. Patient will benefit from skilled OT to address above impairments and improve overall function.  MODIFICATION OR ASSISTANCE TO COMPLETE EVALUATION: No modification of tasks or assist necessary to complete an evaluation.  OT OCCUPATIONAL PROFILE AND HISTORY: Detailed assessment: Review of records and additional review of physical, cognitive, psychosocial history related to current functional performance.  CLINICAL DECISION MAKING: Moderate - several treatment options, min-mod task modification  necessary  REHAB POTENTIAL: Good  EVALUATION COMPLEXITY: Moderate    PLAN:  OT FREQUENCY: 2x/week  OT DURATION: 8 weeks  PLANNED INTERVENTIONS: 97535 self care/ADL training, 02889 therapeutic exercise, 97530 therapeutic activity, 97112 neuromuscular re-education, 97140 manual therapy, 97010 moist heat, 97750 Physical Performance Testing, 02239 Orthotic Initial, [id number] Orthotic/Prosthetic subsequent, passive range of motion, functional mobility training, visual/perceptual remediation/compensation, patient/family education, and DME and/or AE instructions  RECOMMENDED OTHER SERVICES: none  at this time (Pt has SLP evaluation scheduled)   CONSULTED AND AGREED WITH PLAN OF CARE: Patient  PLAN FOR NEXT SESSION: Review PWR! Sitting, issue preventing future PD complications handout and review, work on buttons and LE dressing   Ricardo Cooper, OT 01/12/2024, 1:40 PM

## 2024-01-17 ENCOUNTER — Encounter: Payer: Self-pay | Admitting: Occupational Therapy

## 2024-01-17 ENCOUNTER — Ambulatory Visit: Admitting: Occupational Therapy

## 2024-01-17 DIAGNOSIS — R293 Abnormal posture: Secondary | ICD-10-CM

## 2024-01-17 DIAGNOSIS — R29898 Other symptoms and signs involving the musculoskeletal system: Secondary | ICD-10-CM

## 2024-01-17 DIAGNOSIS — R2681 Unsteadiness on feet: Secondary | ICD-10-CM | POA: Diagnosis not present

## 2024-01-17 DIAGNOSIS — R278 Other lack of coordination: Secondary | ICD-10-CM

## 2024-01-17 DIAGNOSIS — R29818 Other symptoms and signs involving the nervous system: Secondary | ICD-10-CM

## 2024-01-17 NOTE — Patient Instructions (Signed)
 Ways to prevent future Parkinson's related complications:  1.   Exercise regularly/daily. Challenge yourself SAFELY and try to get different types of exercise including: PWR! Moves, Cardio (cycling), strength and balance training, and stretches (Yoga)   2.   Focus on BIGGER movements during daily activities- really reach overhead, straighten elbows and extend fingers  3.   When dressing (especially jacket/coat) or reaching for your seatbelt make sure to use your body to assist by twisting while you reach and looking at where you are reaching - this can help to minimize stress on the shoulder and reduce the risk of a rotator cuff tear  4.   Swing your arms when you walk (unless using walker)! People with PD are at increased risk for frozen shoulder and swinging your arms can reduce this risk.  5. Drink plenty of water and eat a high fiber diet (fresh fruits and veggies, nuts/seeds, beans, some fish). Avoid canned foods, reduce sugar intake and dairy when possible  6. Do NOT take your Parkinson's medication around meals (take at least 30 min before a meal, or 1 hour after a meal), especially protein as it blocks the absorption of the medicine and will not work effectively  7. Keep your feet apart when you are standing (wider stance) to allow you to have better balance and to reach further with your arms. Also make sure your feet are apart before standing up.   8. Stay social! - Go out with friends, play card games with others, join a community group or community PD exercise class  9. Get a good night's sleep! Staying active during the day and developing a good bedtime routine can help with this.   10. Communicate with your neurologist any concerns and maintain overall health - keep blood pressure regulated, reduce stress/anxiety, good nutrition/gut health, etc.

## 2024-01-17 NOTE — Therapy (Signed)
 OUTPATIENT OCCUPATIONAL THERAPY [facility]  Patient Name: Ricardo Cooper MRN: [id number] DOB:March 03, 1946, 78 y.o., male Today's Date: 01/17/2024  PCP: [name], MD REFERRING PROVIDER: Evonnie Asberry RAMAN, DO  END OF SESSION:  OT End of Session - 01/17/24 1105     Visit Number 6    Number of Visits 17    Date for Recertification  02/21/24    Authorization Type [employer]    Progress Note Due on Visit 10    OT Start Time 1103    OT Stop Time 1145    OT Time Calculation (min) 42 min    Activity Tolerance Patient tolerated treatment well    Behavior During Therapy WFL for tasks assessed/performed          Past Medical History:  Diagnosis Date   Allergic rhinitis    Allergy    Cataract    beginnings of cataracts    CHF (congestive heart failure) (HCC)    COPD (chronic obstructive pulmonary disease) (HCC)    Diverticulosis    GERD (gastroesophageal reflux disease)    no issues with CPAP-1999   HTN (hypertension)    Hyperlipidemia    Obesity    Obstructive sleep apnea on CPAP    Paroxysmal atrial fibrillation (HCC) 2005   past trauma and infection   Skin cancer    basel cell on nose,arm,leg   Sleep apnea    with CPAP   Ulcerative colitis    Ulcerative colitis, left sided (HCC)    Past Surgical History:  Procedure Laterality Date   BACK SURGERY  2005   BELPHAROPTOSIS REPAIR     bilateral   COLONOSCOPY  2013   Dr. Albertus    FETAL SURGERY FOR CONGENITAL HERNIA  2001   infection in chest that had to be cleaned out     Staph Infection   POLYPECTOMY     SKIN CANCER EXCISION     squamous and basal cell   TONSILLECTOMY     tubular adenoma  2013   UMBILICAL HERNIA REPAIR     VASECTOMY     Patient Active Problem List   Diagnosis Date Noted   History of colonic polyps 04/28/2022   Obesity (BMI 30-39.9) 07/02/2021   CAP (community acquired pneumonia) 03/18/2021   Plantar fasciitis 01/18/2020   Pain due to onychomycosis of toenails  of both feet 09/18/2019   Multiple lung nodules 06/07/2018   Influenza with respiratory manifestation 06/06/2015   Ulcerative colitis with complication (HCC) 05/03/2011   PURE HYPERCHOLESTEROLEMIA 03/23/2010   PRECORDIAL PAIN 03/23/2010   CHEST TIGHTNESS-PRESSURE-OTHER 03/18/2010   DYSPNEA 11/19/2008   HYPERLIPIDEMIA 02/01/2008   HYPERTENSION 02/01/2008   ATRIAL FIBRILLATION 02/01/2008   Allergic rhinitis 02/01/2008   COPD GOLD II 02/01/2008   Obstructive sleep apnea 02/01/2008    ONSET DATE: 12/06/2023  REFERRING DIAG: G20.A1 (ICD-10-CM) - Parkinson's disease without dyskinesia or fluctuating manifestations (HCC)  THERAPY DIAG:  Abnormal posture  Other symptoms and signs involving the nervous system  Other symptoms and signs involving the musculoskeletal system  Other lack of coordination  Unsteadiness on feet  Rationale for Evaluation and Treatment: Rehabilitation  SUBJECTIVE:   SUBJECTIVE STATEMENT: Sometimes my shoulders are just stiff  Pt accompanied by: self  PERTINENT HISTORY: PMH: Parkinsonism (recently diagnosed 10/2023), CHF, COPD, HTN, HLD, hx of skin cancer, hx of back injury in 2005   Per Dr. Evonnie: left hand rest tremor for 3 months. Notes from primary care indicate that patient has had  bradykinesia with shuffling gait since 2023, now with more recent tremor.   PRECAUTIONS: None  WEIGHT BEARING RESTRICTIONS: No  PAIN:  Are you having pain? No  FALLS: Has patient fallen in last 6 months? 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  HAND DOMINANCE: Right  ADLs: Overall ADLs: difficulty donning sock Lt side Transfers/ambulation related to ADLs: independent Eating: Independent Grooming: independent UB Dressing: more difficulty with jacket and buttons LB Dressing: more difficulty w/ Lt shoe and sock Toileting: difficulty w/ wiping in back  Bathing: mod I w/ walk in shower and 1 grab bar Tub Shower transfers: mod I  Equipment: Grab bars and built in shower seat  IADLs: Shopping: pt/wife go together Light housekeeping: pt doing Presenter, broadcasting and taking out trash Meal Prep: independent  Community mobility: difficulty turning neck to change lanes, etc.  Medication management: independent  Financial management: wife does Handwriting: 90% legible and writes quickly (mild micrographia if writing longer)   MOBILITY STATUS: Independent  POSTURE COMMENTS:  rounded shoulders and increased thoracic kyphosis   FUNCTIONAL OUTCOME MEASURES: Fastening/unfastening 3 buttons: 35.22 sec.  Physical performance test: PPT#2 (simulated eating) 11.66 & PPT#4 (donning/doffing jacket): 16.32 (w/ hospital gown)   COORDINATION: 9 Hole Peg test: Right: 28.57 sec; Left: 30.03 sec Box and Blocks:  Right 51 blocks, Left 42 blocks  UE ROM:  RUE AROM WNL's, LUE AROM WFL's w/ decreased end range shoulder flexion (pt diagnosed with frozen shoulder) and decreased elbow extension LUE   SENSATION: WFL  MUSCLE TONE: not tested  COGNITION: Overall cognitive status: pt reports short term memory deficits  OBSERVATIONS: Hypokinesia and stiffness in neck and Lt side (shoulder, leg), decreased voice volume                                                                                                                    TREATMENT:   Pt issued handout on ways to prevent future PD related complications and reviewed - see pt instructions  Reviewed PWR! Sitting (basic 4) with min cues - pt  improving with larger movements  Practiced donning/doffing regular jacket w/ cape method - pt progressing towards this goal - see below goal section for assessment  Pt shown hip stretch to assist w/ LE dressing (particularly for donning Lt shoe and sock as pt is stiffer on Lt side). Pt shown how to complete stretch laying down and seated.   Pt practiced donning/doffing Lt shoe and sock with greater ease reported   PATIENT EDUCATION: Education details: see above Person educated: Patient Education method: Explanation, Demonstration,  Verbal cues, and Handouts Education comprehension: verbalized understanding, returned demonstration, verbal cues required, and needs further education  HOME EXERCISE PROGRAM: 01/03/24: PWR! Supine, PWR! Hands 01/05/24: coordination HEP  01/10/24: PWR! Sitting, handwriting strategies, ADL strategies 01/12/24: cape method (for jacket), PD ex chart 01/17/24: ways to prevent future PD related complications  GOALS: Goals reviewed with patient? Yes  SHORT TERM GOALS: Target date: 01/22/24  Independent with PD specific HEP   Baseline: Goal status: IN PROGRESS   2.  Pt will verbalize understanding of adaptive strategies to increase ease with ADLS/IADLS   Baseline:  Goal status: IN PROGRESS  3.  Pt to report greater ease with donning shoes/socks, jacket, and buttons Baseline:  Goal status: IN PROGRESS  4.  Pt will demonstrate increased ease with dressing as evidenced by decreasing PPT#4 (don/ doff jacket) to 12 secs or less  Baseline: 16 sec, 01/17/24 = 14 sec. With his own jacket Goal status: IN PROGRESS  5.  Pt to improve LUE function as evidenced by performing 46 blocks on Box & Blocks test Baseline: 42 blocks (Rt = 51)  Goal status: INITIAL   LONG TERM GOALS: Target date: 02/21/24  Pt will verbalize understanding of ways to prevent future PD related complications and appropriate community resources prn  Baseline:  Goal status: IN PROGRESS  2.  Pt  will verbalize understanding of ways to keep thinking skills sharp and ways to compensate for STM changes in the future  Baseline:  Goal status: INITIAL  3.  Pt will demonstrate improved ease with fastening buttons as evidenced by decreasing 3 button/unbutton time by 5 seconds  Baseline: 35 sec Goal status: INITIAL  4.  Pt will demonstrate improved fine motor coordination for ADLs as evidenced by decreasing 9 hole peg test score for Rt hand by 3 secs  Baseline: 28 sec Goal status: INITIAL  5.  Pt will demonstrate improved fine motor coordination for ADLs as evidenced by decreasing 9 hole peg test score for Lt hand by 5 secs  Baseline: 30 sec Goal status: INITIAL   ASSESSMENT:  CLINICAL IMPRESSION: Patient seen today for occupational therapy treatment for Parkinson's disease diagnosed in 2025. Pt responds well to cueing for large amplitude movements, and doing well with therapy. Pt very motivated. Pt would benefit from continued skilled O.T. to address these deficits, develop PD specific HEP, and educate pt on future PD related complications in order to hopefully slow down progression of disease process.    PERFORMANCE DEFICITS: in functional skills including ADLs, IADLs, coordination, dexterity, ROM, strength, pain, flexibility, Fine motor control, Gross motor control, mobility, balance, body mechanics, endurance, decreased knowledge of use of DME, and UE functional use, cognitive skills including memory.   IMPAIRMENTS: are limiting patient from ADLs, IADLs, leisure, and social participation.   COMORBIDITIES:  may have co-morbidities  that affects occupational performance. Patient will benefit from skilled OT to address above impairments and improve overall function.  MODIFICATION OR ASSISTANCE TO COMPLETE EVALUATION: No modification of tasks or assist necessary to complete an evaluation.  OT OCCUPATIONAL PROFILE AND HISTORY: Detailed assessment: Review of records and additional review  of physical, cognitive, psychosocial history related to current functional performance.  CLINICAL DECISION MAKING: Moderate - several treatment options, min-mod task modification necessary  REHAB POTENTIAL: Good  EVALUATION COMPLEXITY: Moderate    PLAN:  OT FREQUENCY: 2x/week  OT DURATION: 8 weeks  PLANNED INTERVENTIONS: 97535 self care/ADL training, 02889 therapeutic exercise, 97530 therapeutic activity, 97112 neuromuscular re-education, 97140 manual therapy, 97010 moist  heat, [id number] Physical Performance Testing, 02239 Orthotic Initial, [id number] Orthotic/Prosthetic subsequent, passive range of motion, functional mobility training, visual/perceptual remediation/compensation, patient/family education, and DME and/or AE instructions  RECOMMENDED OTHER SERVICES: none at this time (Pt has SLP evaluation scheduled)   CONSULTED AND AGREED WITH PLAN OF CARE: Patient  PLAN FOR NEXT SESSION:  work on buttons, issue memory strategies/ways to keep thinking skills sharp    Burnard JINNY Roads, OT 01/17/2024, 11:05 AM

## 2024-01-19 ENCOUNTER — Encounter: Payer: Self-pay | Admitting: Occupational Therapy

## 2024-01-19 ENCOUNTER — Ambulatory Visit: Admitting: Occupational Therapy

## 2024-01-19 DIAGNOSIS — R293 Abnormal posture: Secondary | ICD-10-CM

## 2024-01-19 DIAGNOSIS — R29818 Other symptoms and signs involving the nervous system: Secondary | ICD-10-CM

## 2024-01-19 DIAGNOSIS — R2681 Unsteadiness on feet: Secondary | ICD-10-CM

## 2024-01-19 DIAGNOSIS — R278 Other lack of coordination: Secondary | ICD-10-CM

## 2024-01-19 NOTE — Patient Instructions (Addendum)
 Keeping Thinking Skills Sharp:  1. Jigsaw puzzles  2. Card/board games  3. Talking on the phone/going to social events  4. [email or web address]  5. Online games  6. Word searches/crossword puzzles  7.  Logic puzzles  8. Aerobic exercise (stationary bike)  9. Eating balanced diet (fruits & veggies)  10. Drink water  11. Try something new--new recipe, hobby  12. Crafts  13. Do a variety of activities that are challenging  14.  Plan weekly meals and write a grocery list  15. Add cognitive activities to walking/exercising (think of animal/food/city with each letter of the alphabet, counting backwards, thinking of as many vegetables as you can, etc.).--Only do this if safe (no freezing/falls).         Memory Compensation Strategies  Use "WARM" strategy. W= write it down A=  associate it R=  repeat it M=  make a mental picture  You can keep a Memory Notebook. Use a 3-ring notebook with sections for the following:  calendar, important names and phone numbers, medications, doctors' names/phone numbers, "to do list"/reminders, and a section to journal what you did each day  Use a calendar to write appointments down.  Write yourself a schedule for the day.  This can be placed on the calendar or in a separate section of the Memory Notebook.  Keeping a regular schedule can help memory.  Use medication organizer with sections for each day or morning/evening pills  You may need help loading it  Keep a basket, or pegboard by the door.   Place items that you need to take out with you in the basket or on the pegboard.  You may also want to include a message board for reminders.  Use sticky notes. Place sticky notes with reminders in a place where the task is performed.  For example:  "turn off the stove" placed by the stove, "lock the door" placed on the door at eye level, "take your medications" on the bathroom mirror or by the place where you normally take your  medications  Use alarms, timers, and/or a reminder app. Use while cooking to remind yourself to check on food or as a reminder to take your medicine, or as a reminder to make a call, or as a reminder to perform another task, etc.  Use a voice recorder app or small tape recorder to record important information and notes for yourself. Go back at the end of the day and listen to these.

## 2024-01-19 NOTE — Therapy (Signed)
 OUTPATIENT OCCUPATIONAL THERAPY [facility]  Patient Name: Ricardo Cooper MRN: [id number] DOB:08/15/1945, 78 y.o., male Today's Date: 01/19/2024  PCP: [name], MD REFERRING PROVIDER: Evonnie Asberry RAMAN, DO  END OF SESSION:  OT End of Session - 01/19/24 1103     Visit Number 7    Number of Visits 17    Date for Recertification  02/21/24    Authorization Type [employer]    Progress Note Due on Visit 10    OT Start Time 1102    OT Stop Time 1155    OT Time Calculation (min) 53 min    Activity Tolerance Patient tolerated treatment well    Behavior During Therapy WFL for tasks assessed/performed          Past Medical History:  Diagnosis Date   Allergic rhinitis    Allergy    Cataract    beginnings of cataracts    CHF (congestive heart failure) (HCC)    COPD (chronic obstructive pulmonary disease) (HCC)    Diverticulosis    GERD (gastroesophageal reflux disease)    no issues with CPAP-1999   HTN (hypertension)    Hyperlipidemia    Obesity    Obstructive sleep apnea on CPAP    Paroxysmal atrial fibrillation (HCC) 2005   past trauma and infection   Skin cancer    basel cell on nose,arm,leg   Sleep apnea    with CPAP   Ulcerative colitis    Ulcerative colitis, left sided (HCC)    Past Surgical History:  Procedure Laterality Date   BACK SURGERY  2005   BELPHAROPTOSIS REPAIR     bilateral   COLONOSCOPY  2013   Dr. Albertus    FETAL SURGERY FOR CONGENITAL HERNIA  2001   infection in chest that had to be cleaned out     Staph Infection   POLYPECTOMY     SKIN CANCER EXCISION     squamous and basal cell   TONSILLECTOMY     tubular adenoma  2013   UMBILICAL HERNIA REPAIR     VASECTOMY     Patient Active Problem List   Diagnosis Date Noted   History of colonic polyps 04/28/2022   Obesity (BMI 30-39.9) 07/02/2021   CAP (community acquired pneumonia) 03/18/2021   Plantar fasciitis 01/18/2020   Pain due to onychomycosis of toenails  of both feet 09/18/2019   Multiple lung nodules 06/07/2018   Influenza with respiratory manifestation 06/06/2015   Ulcerative colitis with complication (HCC) 05/03/2011   PURE HYPERCHOLESTEROLEMIA 03/23/2010   PRECORDIAL PAIN 03/23/2010   CHEST TIGHTNESS-PRESSURE-OTHER 03/18/2010   DYSPNEA 11/19/2008   HYPERLIPIDEMIA 02/01/2008   HYPERTENSION 02/01/2008   ATRIAL FIBRILLATION 02/01/2008   Allergic rhinitis 02/01/2008   COPD GOLD II 02/01/2008   Obstructive sleep apnea 02/01/2008    ONSET DATE: 12/06/2023  REFERRING DIAG: G20.A1 (ICD-10-CM) - Parkinson's disease without dyskinesia or fluctuating manifestations (HCC)  THERAPY DIAG:  Abnormal posture  Other symptoms and signs involving the nervous system  Other lack of coordination  Unsteadiness on feet  Rationale for Evaluation and Treatment: Rehabilitation  SUBJECTIVE:   SUBJECTIVE STATEMENT: My Lt shoulder is a little sore from yesterday's yard work but it's always like that. No pain though. NO falls Pt accompanied by: self  PERTINENT HISTORY: PMH: Parkinsonism (recently diagnosed 10/2023), CHF, COPD, HTN, HLD, hx of skin cancer, hx of back injury in 2005   Per Dr. Evonnie: left hand rest tremor for 3 months. Notes from primary care  indicate that patient has had bradykinesia with shuffling gait since 2023, now with more recent tremor.   PRECAUTIONS: None  WEIGHT BEARING RESTRICTIONS: No  PAIN:  Are you having pain? No  FALLS: Has patient fallen in last 6 months? 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  HAND DOMINANCE: Right  ADLs: Overall ADLs: difficulty donning sock Lt side Transfers/ambulation related to ADLs: independent Eating: Independent Grooming: independent UB Dressing: more difficulty with jacket and buttons LB Dressing: more difficulty w/ Lt shoe and sock Toileting: difficulty w/ wiping in back  Bathing: mod I w/ walk in shower and 1 grab bar Tub Shower transfers: mod I  Equipment: Grab bars and built in shower seat  IADLs: Shopping: pt/wife go together Light housekeeping: pt doing Presenter, broadcasting and taking out trash Meal Prep: independent  Community mobility: difficulty turning neck to change lanes, etc.  Medication management: independent  Financial management: wife does Handwriting: 90% legible and writes quickly (mild micrographia if writing longer)   MOBILITY STATUS: Independent  POSTURE COMMENTS:  rounded shoulders and increased thoracic kyphosis   FUNCTIONAL OUTCOME MEASURES: Fastening/unfastening 3 buttons: 35.22 sec.  Physical performance test: PPT#2 (simulated eating) 11.66 & PPT#4 (donning/doffing jacket): 16.32 (w/ hospital gown)   COORDINATION: 9 Hole Peg test: Right: 28.57 sec; Left: 30.03 sec Box and Blocks:  Right 51 blocks, Left 42 blocks  UE ROM:  RUE AROM WNL's, LUE AROM WFL's w/ decreased end range shoulder flexion (pt diagnosed with frozen shoulder) and decreased elbow extension LUE   SENSATION: WFL  MUSCLE TONE: not tested  COGNITION: Overall cognitive status: pt reports short term memory deficits  OBSERVATIONS: Hypokinesia and stiffness in neck and Lt side (shoulder, leg), decreased voice volume                                                                                                                    TREATMENT:   Pt shown strategies for hooking/unhooking buttons and practiced. Pt benefited from pull then push method for unhooking buttons as this was harder for him than hooking  buttons. Also practiced top collar button with adjustments in hand placement  Pt reports tightness when reaching behind him to pull sleeve of jacket off or to loop belt - pt shown 2 stretches to perform including IR stretch with dowel (sliding up behind back) and ER/IR towel stretch  Pt issued ways to keep thinking skills sharp and memory strategies - reviewed things pt can do to keep cognition optimal including: card games, online games, suduko, crossword puzzles learning novel tasks, multi-tasking, staying  social, etc.   Pt also issued community resources for community PD fitness classes and online resources, as well as POP support group info. Encouraged pt to take a variety of classes including: advanced PWR! Class, cycling, etc. once d/c from therapy  PATIENT EDUCATION: Education details: see above Person educated: Patient Education method: Explanation, Demonstration, Verbal cues, and Handouts Education comprehension: verbalized understanding, returned demonstration, verbal cues required, and needs further education  HOME EXERCISE PROGRAM: 01/03/24: PWR! Supine, PWR! Hands 01/05/24: coordination HEP  01/10/24: PWR! Sitting, handwriting strategies, ADL strategies 01/12/24: cape method (for jacket), PD ex chart 01/17/24: ways to prevent future PD related complications 01/19/24: ways to keep thinking skills sharp and memory strategies, community resources  GOALS: Goals reviewed with patient? Yes  SHORT TERM GOALS: Target date: 01/22/24  Independent with PD specific HEP   Baseline: Goal status: MET  2.  Pt will verbalize understanding of adaptive strategies to increase ease with ADLS/IADLS   Baseline:  Goal status: MET  3.  Pt to report greater ease with donning shoes/socks, jacket, and buttons Baseline:  Goal status: IN PROGRESS  4.  Pt will demonstrate increased ease with dressing as evidenced by decreasing PPT#4 (don/ doff jacket) to 12 secs or less  Baseline: 16 sec, 01/17/24 = 14  sec. With his own jacket Goal status: IN PROGRESS  5.  Pt to improve LUE function as evidenced by performing 46 blocks on Box & Blocks test Baseline: 42 blocks (Rt = 51)  Goal status: INITIAL   LONG TERM GOALS: Target date: 02/21/24  Pt will verbalize understanding of ways to prevent future PD related complications and appropriate community resources prn  Baseline:  Goal status: MET  2.  Pt will verbalize understanding of ways to keep thinking skills sharp and ways to compensate for STM changes in the future  Baseline:  Goal status: MET   3.  Pt will demonstrate improved ease with fastening buttons as evidenced by decreasing 3 button/unbutton time by 5 seconds  Baseline: 35 sec Goal status: INITIAL  4.  Pt will demonstrate improved fine motor coordination for ADLs as evidenced by decreasing 9 hole peg test score for Rt hand by 3 secs  Baseline: 28 sec Goal status: INITIAL  5.  Pt will demonstrate improved fine motor coordination for ADLs as evidenced by decreasing 9 hole peg test score for Lt hand by 5 secs  Baseline: 30 sec Goal status: INITIAL   ASSESSMENT:  CLINICAL IMPRESSION: Patient seen today for occupational therapy treatment for Parkinson's disease diagnosed in 2025. Pt responds well to cueing for large amplitude movements, and doing well with therapy. Pt very motivated. Pt has met some goals and progressing towards remaining goals. Pt would benefit from continued skilled O.T. to address these deficits, develop PD specific HEP, and educate pt on future PD related complications in order to hopefully slow down progression of disease process.    PERFORMANCE DEFICITS: in functional skills including ADLs, IADLs, coordination, dexterity, ROM, strength, pain, flexibility, Fine motor control, Gross motor control, mobility, balance, body mechanics, endurance, decreased knowledge of use of DME, and UE functional use, cognitive skills including memory.   IMPAIRMENTS: are limiting  patient from ADLs, IADLs, leisure, and social participation.   COMORBIDITIES:  may have co-morbidities  that affects occupational performance. Patient will benefit from skilled OT to address above impairments and improve overall function.  MODIFICATION OR ASSISTANCE TO COMPLETE EVALUATION: No modification of tasks or assist necessary to complete an evaluation.  OT OCCUPATIONAL PROFILE  AND HISTORY: Detailed assessment: Review of records and additional review of physical, cognitive, psychosocial history related to current functional performance.  CLINICAL DECISION MAKING: Moderate - several treatment options, min-mod task modification necessary  REHAB POTENTIAL: Good  EVALUATION COMPLEXITY: Moderate    PLAN:  OT FREQUENCY: 2x/week  OT DURATION: 8 weeks  PLANNED INTERVENTIONS: 97535 self care/ADL training, 02889 therapeutic exercise, 97530 therapeutic activity, 97112 neuromuscular re-education, 97140 manual therapy, 97010 moist heat, 97750 Physical Performance Testing, 02239 Orthotic Initial, 97763 Orthotic/Prosthetic subsequent, passive range of motion, functional mobility training, visual/perceptual remediation/compensation, patient/family education, and DME and/or AE instructions  RECOMMENDED OTHER SERVICES: none at this time (Pt has SLP evaluation scheduled)   CONSULTED AND AGREED WITH PLAN OF CARE: Patient  PLAN FOR NEXT SESSION:  multi-tasking, coordination w/ reaching and wt shifts on compliant surface, UBE   Burnard JINNY Roads, OT 01/19/2024, 12:01 PM

## 2024-01-23 ENCOUNTER — Other Ambulatory Visit: Payer: Self-pay | Admitting: Adult Health

## 2024-01-23 ENCOUNTER — Encounter: Payer: Self-pay | Admitting: Occupational Therapy

## 2024-01-23 ENCOUNTER — Ambulatory Visit: Payer: Self-pay | Admitting: Occupational Therapy

## 2024-01-23 DIAGNOSIS — R29818 Other symptoms and signs involving the nervous system: Secondary | ICD-10-CM

## 2024-01-23 DIAGNOSIS — R2681 Unsteadiness on feet: Secondary | ICD-10-CM

## 2024-01-23 DIAGNOSIS — R293 Abnormal posture: Secondary | ICD-10-CM

## 2024-01-23 DIAGNOSIS — R29898 Other symptoms and signs involving the musculoskeletal system: Secondary | ICD-10-CM

## 2024-01-23 DIAGNOSIS — R278 Other lack of coordination: Secondary | ICD-10-CM

## 2024-01-23 NOTE — Therapy (Signed)
 OUTPATIENT OCCUPATIONAL THERAPY [facility]  Patient Name: Ricardo Cooper MRN: [id number] DOB:1945/09/03, 78 y.o., male Today's Date: 01/23/2024  PCP: [name], MD REFERRING PROVIDER: Evonnie Asberry RAMAN, DO  END OF SESSION:  OT End of Session - 01/23/24 1018     Visit Number 8    Number of Visits 17    Date for Recertification  02/21/24    Authorization Type [employer]    Progress Note Due on Visit 10    OT Start Time 1015    OT Stop Time 1100    OT Time Calculation (min) 45 min    Activity Tolerance Patient tolerated treatment well    Behavior During Therapy WFL for tasks assessed/performed          Past Medical History:  Diagnosis Date   Allergic rhinitis    Allergy    Cataract    beginnings of cataracts    CHF (congestive heart failure) (HCC)    COPD (chronic obstructive pulmonary disease) (HCC)    Diverticulosis    GERD (gastroesophageal reflux disease)    no issues with CPAP-1999   HTN (hypertension)    Hyperlipidemia    Obesity    Obstructive sleep apnea on CPAP    Paroxysmal atrial fibrillation (HCC) 2005   past trauma and infection   Skin cancer    basel cell on nose,arm,leg   Sleep apnea    with CPAP   Ulcerative colitis    Ulcerative colitis, left sided (HCC)    Past Surgical History:  Procedure Laterality Date   BACK SURGERY  2005   BELPHAROPTOSIS REPAIR     bilateral   COLONOSCOPY  2013   Dr. Albertus    FETAL SURGERY FOR CONGENITAL HERNIA  2001   infection in chest that had to be cleaned out     Staph Infection   POLYPECTOMY     SKIN CANCER EXCISION     squamous and basal cell   TONSILLECTOMY     tubular adenoma  2013   UMBILICAL HERNIA REPAIR     VASECTOMY     Patient Active Problem List   Diagnosis Date Noted   History of colonic polyps 04/28/2022   Obesity (BMI 30-39.9) 07/02/2021   CAP (community acquired pneumonia) 03/18/2021   Plantar fasciitis 01/18/2020   Pain due to onychomycosis of toenails  of both feet 09/18/2019   Multiple lung nodules 06/07/2018   Influenza with respiratory manifestation 06/06/2015   Ulcerative colitis with complication (HCC) 05/03/2011   PURE HYPERCHOLESTEROLEMIA 03/23/2010   PRECORDIAL PAIN 03/23/2010   CHEST TIGHTNESS-PRESSURE-OTHER 03/18/2010   DYSPNEA 11/19/2008   HYPERLIPIDEMIA 02/01/2008   HYPERTENSION 02/01/2008   ATRIAL FIBRILLATION 02/01/2008   Allergic rhinitis 02/01/2008   COPD GOLD II 02/01/2008   Obstructive sleep apnea 02/01/2008    ONSET DATE: 12/06/2023  REFERRING DIAG: G20.A1 (ICD-10-CM) - Parkinson's disease without dyskinesia or fluctuating manifestations (HCC)  THERAPY DIAG:  Abnormal posture  Other symptoms and signs involving the nervous system  Other lack of coordination  Unsteadiness on feet  Other symptoms and signs involving the musculoskeletal system  Rationale for Evaluation and Treatment: Rehabilitation  SUBJECTIVE:   SUBJECTIVE STATEMENT: This is hard (re: below activities)  Pt accompanied by: self  PERTINENT HISTORY: PMH: Parkinsonism (recently diagnosed 10/2023), CHF, COPD, HTN, HLD, hx of skin cancer, hx of back injury in 2005   Per Dr. Evonnie: left hand rest tremor for 3 months. Notes from primary care indicate that patient has had  bradykinesia with shuffling gait since 2023, now with more recent tremor.   PRECAUTIONS: None  WEIGHT BEARING RESTRICTIONS: No  PAIN:  Are you having pain? No  FALLS: Has patient fallen in last 6 months? 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  HAND DOMINANCE: Right  ADLs: Overall ADLs: difficulty donning sock Lt side Transfers/ambulation related to ADLs: independent Eating: Independent Grooming: independent UB Dressing: more difficulty with jacket and buttons LB Dressing: more difficulty w/ Lt shoe and sock Toileting: difficulty w/ wiping in back  Bathing: mod I w/ walk in shower and 1 grab bar Tub Shower transfers: mod I  Equipment: Grab bars and built in shower seat  IADLs: Shopping: pt/wife go together Light housekeeping: pt doing Presenter, broadcasting and taking out trash Meal Prep: independent  Community mobility: difficulty turning neck to change lanes, etc.  Medication management: independent  Financial management: wife does Handwriting: 90% legible and writes quickly (mild micrographia if writing longer)   MOBILITY STATUS: Independent  POSTURE COMMENTS:  rounded shoulders and increased thoracic kyphosis   FUNCTIONAL OUTCOME MEASURES: Fastening/unfastening 3 buttons: 35.22 sec.  Physical performance test: PPT#2 (simulated eating) 11.66 & PPT#4 (donning/doffing jacket): 16.32 (w/ hospital gown)   COORDINATION: 9 Hole Peg test: Right: 28.57 sec; Left: 30.03 sec Box and Blocks:  Right 51 blocks, Left 42 blocks  UE ROM:  RUE AROM WNL's, LUE AROM WFL's w/ decreased end range shoulder flexion (pt diagnosed with frozen shoulder) and decreased elbow extension LUE   SENSATION: WFL  MUSCLE TONE: not tested  COGNITION: Overall cognitive status: pt reports short term memory deficits  OBSERVATIONS: Hypokinesia and stiffness in neck and Lt side (shoulder, leg), decreased voice volume                                                                                                                    TREATMENT:   Pt tossing scarf up in air and catching with opposite side with ambulation and cognitive task (naming foods w/ different levels of the alphabet) w/ slight LOB several  times  Pt standing in corner on compliant surface to place medium sized pegs in pegboard on vertical surface requiring wt shifts, trunk rotation, FMC, and UE reaching bilaterally   UBE x 8 mintues, level 3 for UE strength/endurance keeping RPM > 40    PATIENT EDUCATION: Education details: see above Person educated: Patient Education method: Programmer, multimedia, Demonstration, Verbal cues, and Handouts Education comprehension: verbalized understanding, returned demonstration, verbal cues required, and needs further education  HOME EXERCISE PROGRAM: 01/03/24: PWR! Supine, PWR! Hands 01/05/24:  coordination HEP  01/10/24: PWR! Sitting, handwriting strategies, ADL strategies 01/12/24: cape method (for jacket), PD ex chart 01/17/24: ways to prevent future PD related complications 01/19/24: ways to keep thinking skills sharp and memory strategies, community resources  GOALS: Goals reviewed with patient? Yes  SHORT TERM GOALS: Target date: 01/22/24  Independent with PD specific HEP   Baseline: Goal status: MET  2.  Pt will verbalize understanding of adaptive strategies to increase ease with ADLS/IADLS   Baseline:  Goal status: MET  3.  Pt to report greater ease with donning shoes/socks, jacket, and buttons Baseline:  Goal status: IN PROGRESS  4.  Pt will demonstrate increased ease with dressing as evidenced by decreasing PPT#4 (don/ doff jacket) to 12 secs or less  Baseline: 16 sec, 01/17/24 = 14 sec. With his own jacket Goal status: IN PROGRESS  5.  Pt to improve LUE function as evidenced by performing 46 blocks on Box & Blocks test Baseline: 42 blocks (Rt = 51)  Goal status: INITIAL   LONG TERM GOALS: Target date: 02/21/24  Pt will verbalize understanding of ways to prevent future PD related complications and appropriate community resources prn  Baseline:  Goal status: MET  2.  Pt will verbalize understanding of ways to keep thinking skills sharp and ways to compensate for STM changes  in the future  Baseline:  Goal status: MET   3.  Pt will demonstrate improved ease with fastening buttons as evidenced by decreasing 3 button/unbutton time by 5 seconds  Baseline: 35 sec Goal status: INITIAL  4.  Pt will demonstrate improved fine motor coordination for ADLs as evidenced by decreasing 9 hole peg test score for Rt hand by 3 secs  Baseline: 28 sec Goal status: INITIAL  5.  Pt will demonstrate improved fine motor coordination for ADLs as evidenced by decreasing 9 hole peg test score for Lt hand by 5 secs  Baseline: 30 sec Goal status: INITIAL   ASSESSMENT:  CLINICAL IMPRESSION: Patient seen today for occupational therapy treatment for Parkinson's disease diagnosed in 2025. Pt responds well to cueing for large amplitude movements, and doing well with therapy. Pt very motivated. Pt has met some goals and progressing towards remaining goals. Pt would benefit from continued skilled O.T. to address these deficits, develop PD specific HEP, and educate pt on future PD related complications in order to hopefully slow down progression of disease process.    PERFORMANCE DEFICITS: in functional skills including ADLs, IADLs, coordination, dexterity, ROM, strength, pain, flexibility, Fine motor control, Gross motor control, mobility, balance, body mechanics, endurance, decreased knowledge of use of DME, and UE functional use, cognitive skills including memory.   IMPAIRMENTS: are limiting patient from ADLs, IADLs, leisure, and social participation.   COMORBIDITIES:  may have co-morbidities  that affects occupational performance. Patient will benefit from skilled OT to address above impairments and improve overall function.  MODIFICATION OR ASSISTANCE TO COMPLETE EVALUATION: No modification of tasks or assist necessary to complete an evaluation.  OT OCCUPATIONAL PROFILE AND HISTORY: Detailed assessment: Review of records and additional review of physical, cognitive, psychosocial history  related to current functional performance.  CLINICAL DECISION MAKING: Moderate - several treatment options, min-mod task modification necessary  REHAB POTENTIAL: Good  EVALUATION COMPLEXITY: Moderate    PLAN:  OT FREQUENCY: 2x/week  OT DURATION: 8 weeks  PLANNED INTERVENTIONS: 97535 self care/ADL training, 02889 therapeutic exercise, 97530 therapeutic activity, 97112 neuromuscular re-education, 97140 manual therapy, 97010 moist heat, 97750 Physical Performance Testing, 02239 Orthotic Initial,  02236 Orthotic/Prosthetic subsequent, passive range of motion, functional mobility training, visual/perceptual remediation/compensation, patient/family education, and DME and/or AE instructions  RECOMMENDED OTHER SERVICES: none at this time (Pt has SLP evaluation scheduled)   CONSULTED AND AGREED WITH PLAN OF CARE: Patient  PLAN FOR NEXT SESSION: Check remaining STG's, step pattern with reciprocal inhibition,  review PWR! All 4-s   Burnard JINNY Roads, OT 01/23/2024, 10:19 AM

## 2024-01-24 DIAGNOSIS — H2513 Age-related nuclear cataract, bilateral: Secondary | ICD-10-CM | POA: Diagnosis not present

## 2024-01-24 DIAGNOSIS — H43813 Vitreous degeneration, bilateral: Secondary | ICD-10-CM | POA: Diagnosis not present

## 2024-01-24 DIAGNOSIS — H353121 Nonexudative age-related macular degeneration, left eye, early dry stage: Secondary | ICD-10-CM | POA: Diagnosis not present

## 2024-01-24 DIAGNOSIS — H43823 Vitreomacular adhesion, bilateral: Secondary | ICD-10-CM | POA: Diagnosis not present

## 2024-01-24 DIAGNOSIS — H353211 Exudative age-related macular degeneration, right eye, with active choroidal neovascularization: Secondary | ICD-10-CM | POA: Diagnosis not present

## 2024-01-25 ENCOUNTER — Telehealth: Payer: Self-pay | Admitting: Adult Health

## 2024-01-25 NOTE — Telephone Encounter (Signed)
 Spoke with pt and informed him that DME companies to their own auths for the equipment. I informed him he would need to call [employer] who is CPAP is through to inform them. Pt verbalized understanding . NFN

## 2024-01-25 NOTE — Telephone Encounter (Signed)
 This PT came in with documentation from his new insurance company [employer]. They denied coverage of his CPAP because they said they don't have any authorization on file. He has had the Cpap for 6 years and gets seen regularly. Can you please assist the PT with his questions.

## 2024-01-26 ENCOUNTER — Ambulatory Visit: Admitting: Occupational Therapy

## 2024-01-28 DIAGNOSIS — G4733 Obstructive sleep apnea (adult) (pediatric): Secondary | ICD-10-CM | POA: Diagnosis not present

## 2024-01-31 ENCOUNTER — Encounter: Payer: Self-pay | Admitting: Occupational Therapy

## 2024-01-31 ENCOUNTER — Ambulatory Visit: Attending: Neurology | Admitting: Occupational Therapy

## 2024-01-31 DIAGNOSIS — R2681 Unsteadiness on feet: Secondary | ICD-10-CM | POA: Diagnosis not present

## 2024-01-31 DIAGNOSIS — R278 Other lack of coordination: Secondary | ICD-10-CM | POA: Insufficient documentation

## 2024-01-31 DIAGNOSIS — R29898 Other symptoms and signs involving the musculoskeletal system: Secondary | ICD-10-CM | POA: Diagnosis not present

## 2024-01-31 DIAGNOSIS — R29818 Other symptoms and signs involving the nervous system: Secondary | ICD-10-CM | POA: Insufficient documentation

## 2024-01-31 DIAGNOSIS — R293 Abnormal posture: Secondary | ICD-10-CM | POA: Insufficient documentation

## 2024-01-31 NOTE — Therapy (Signed)
 OUTPATIENT OCCUPATIONAL THERAPY [facility]  Patient Name: Ricardo Cooper MRN: [id number] DOB:1946/02/20, 78 y.o., male Today's Date: 01/31/2024  PCP: [name], MD REFERRING PROVIDER: Evonnie Asberry RAMAN, DO  END OF SESSION:  OT End of Session - 01/31/24 1241     Visit Number 9    Number of Visits 17    Date for Recertification  02/21/24    Authorization Type [employer]    Progress Note Due on Visit 10    OT Start Time 1239    OT Stop Time 1318    OT Time Calculation (min) 39 min    Activity Tolerance Patient tolerated treatment well    Behavior During Therapy WFL for tasks assessed/performed          Past Medical History:  Diagnosis Date   Allergic rhinitis    Allergy    Cataract    beginnings of cataracts    CHF (congestive heart failure) (HCC)    COPD (chronic obstructive pulmonary disease) (HCC)    Diverticulosis    GERD (gastroesophageal reflux disease)    no issues with CPAP-1999   HTN (hypertension)    Hyperlipidemia    Obesity    Obstructive sleep apnea on CPAP    Paroxysmal atrial fibrillation (HCC) 2005   past trauma and infection   Skin cancer    basel cell on nose,arm,leg   Sleep apnea    with CPAP   Ulcerative colitis    Ulcerative colitis, left sided (HCC)    Past Surgical History:  Procedure Laterality Date   BACK SURGERY  2005   BELPHAROPTOSIS REPAIR     bilateral   COLONOSCOPY  2013   Dr. Albertus    FETAL SURGERY FOR CONGENITAL HERNIA  2001   infection in chest that had to be cleaned out     Staph Infection   POLYPECTOMY     SKIN CANCER EXCISION     squamous and basal cell   TONSILLECTOMY     tubular adenoma  2013   UMBILICAL HERNIA REPAIR     VASECTOMY     Patient Active Problem List   Diagnosis Date Noted   History of colonic polyps 04/28/2022   Obesity (BMI 30-39.9) 07/02/2021   CAP (community acquired pneumonia) 03/18/2021   Plantar fasciitis 01/18/2020   Pain due to onychomycosis of toenails  of both feet 09/18/2019   Multiple lung nodules 06/07/2018   Influenza with respiratory manifestation 06/06/2015   Ulcerative colitis with complication (HCC) 05/03/2011   PURE HYPERCHOLESTEROLEMIA 03/23/2010   PRECORDIAL PAIN 03/23/2010   CHEST TIGHTNESS-PRESSURE-OTHER 03/18/2010   DYSPNEA 11/19/2008   HYPERLIPIDEMIA 02/01/2008   HYPERTENSION 02/01/2008   ATRIAL FIBRILLATION 02/01/2008   Allergic rhinitis 02/01/2008   COPD GOLD II 02/01/2008   Obstructive sleep apnea 02/01/2008    ONSET DATE: 12/06/2023  REFERRING DIAG: G20.A1 (ICD-10-CM) - Parkinson's disease without dyskinesia or fluctuating manifestations (HCC)  THERAPY DIAG:  Other symptoms and signs involving the nervous system  Abnormal posture  Other lack of coordination  Unsteadiness on feet  Other symptoms and signs involving the musculoskeletal system  Rationale for Evaluation and Treatment: Rehabilitation  SUBJECTIVE:   SUBJECTIVE STATEMENT: This is hard (re: below activities)  Pt accompanied by: self  PERTINENT HISTORY: PMH: Parkinsonism (recently diagnosed 10/2023), CHF, COPD, HTN, HLD, hx of skin cancer, hx of back injury in 2005   Per Dr. Evonnie: left hand rest tremor for 3 months. Notes from primary care indicate that patient has had  bradykinesia with shuffling gait since 2023, now with more recent tremor.   PRECAUTIONS: None  WEIGHT BEARING RESTRICTIONS: No  PAIN:  Are you having pain? No  FALLS: Has patient fallen in last 6 months? 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  HAND DOMINANCE: Right  ADLs: Overall ADLs: difficulty donning sock Lt side Transfers/ambulation related to ADLs: independent Eating: Independent Grooming: independent UB Dressing: more difficulty with jacket and buttons LB Dressing: more difficulty w/ Lt shoe and sock Toileting: difficulty w/ wiping in back  Bathing: mod I w/ walk in shower and 1 grab bar Tub Shower transfers: mod I  Equipment: Grab bars and built in shower seat  IADLs: Shopping: pt/wife go together Light housekeeping: pt doing Presenter, broadcasting and taking out trash Meal Prep: independent  Community mobility: difficulty turning neck to change lanes, etc.  Medication management: independent  Financial management: wife does Handwriting: 90% legible and writes quickly (mild micrographia if writing longer)   MOBILITY STATUS: Independent  POSTURE COMMENTS:  rounded shoulders and increased thoracic kyphosis   FUNCTIONAL OUTCOME MEASURES: Fastening/unfastening 3 buttons: 35.22 sec.  Physical performance test: PPT#2 (simulated eating) 11.66 & PPT#4 (donning/doffing jacket): 16.32 (w/ hospital gown)   COORDINATION: 9 Hole Peg test: Right: 28.57 sec; Left: 30.03 sec Box and Blocks:  Right 51 blocks, Left 42 blocks  UE ROM:  RUE AROM WNL's, LUE AROM WFL's w/ decreased end range shoulder flexion (pt diagnosed with frozen shoulder) and decreased elbow extension LUE   SENSATION: WFL  MUSCLE TONE: not tested  COGNITION: Overall cognitive status: pt reports short term memory deficits  OBSERVATIONS: Hypokinesia and stiffness in neck and Lt side (shoulder, leg), decreased voice volume                                                                                                                    TREATMENT:   Assessed remaining STG's - pt improved on button speed. Pt has slightly improved with donning/doffing jacket. Pt also improved on LUE Box & Blocks score - see below goal  section  Reviewed PWR! All 4's w/ modifications required for PWR! Twist (d/t low back pain) and PWR! Step - pt standing w/ BUE's over low surface or chair to perform PWR! Twist; then on knees w/ hands on chair for PWR! Step. Pt demo each and verbalized understanding with modifications prn  UBE x 5 mintues, level 3 for UE strength/endurance keeping RPM > 40 to increase HR for cardiovascular benefit and to reduce PD symptoms   PATIENT EDUCATION: Education details: see above Person  educated: Patient Education method: Explanation, Demonstration, Verbal cues, and Handouts Education comprehension: verbalized understanding, returned demonstration, verbal cues required, and needs further education  HOME EXERCISE PROGRAM: 01/03/24: PWR! Supine, PWR! Hands 01/05/24: coordination HEP  01/10/24: PWR! Sitting, handwriting strategies, ADL strategies 01/12/24: cape method (for jacket), PD ex chart 01/17/24: ways to prevent future PD related complications 01/19/24: ways to keep thinking skills sharp and memory strategies, community resources  GOALS: Goals reviewed with patient? Yes  SHORT TERM GOALS: Target date: 01/22/24  Independent with PD specific HEP   Baseline: Goal status: MET  2.  Pt will verbalize understanding of adaptive strategies to increase ease with ADLS/IADLS   Baseline:  Goal status: MET  3.  Pt to report greater ease with donning shoes/socks, jacket, and buttons Baseline:  Goal status: MET   4.  Pt will demonstrate increased ease with dressing as evidenced by decreasing PPT#4 (don/ doff jacket) to 12 secs or less  Baseline: 16 sec, 01/17/24 = 14 sec. With his own jacket Goal status: NOT MET (14.85 sec)   5.  Pt to improve LUE function as evidenced by performing 46 blocks on Box & Blocks test Baseline: 42 blocks (Rt = 51)  Goal status: MET (01/31/24: 47 blocks)    LONG TERM GOALS: Target date: 02/21/24  Pt will verbalize understanding of ways to prevent future PD related  complications and appropriate community resources prn  Baseline:  Goal status: MET  2.  Pt will verbalize understanding of ways to keep thinking skills sharp and ways to compensate for STM changes in the future  Baseline:  Goal status: MET   3.  Pt will demonstrate improved ease with fastening buttons as evidenced by decreasing 3 button/unbutton time by 5 seconds  Baseline: 35 sec Goal status: MET (01/31/24: 25 sec)   4.  Pt will demonstrate improved fine motor coordination for ADLs as evidenced by decreasing 9 hole peg test score for Rt hand by 3 secs  Baseline: 28 sec Goal status: INITIAL  5.  Pt will demonstrate improved fine motor coordination for ADLs as evidenced by decreasing 9 hole peg test score for Lt hand by 5 secs  Baseline: 30 sec Goal status: INITIAL   ASSESSMENT:  CLINICAL IMPRESSION: Patient progressing towards all goals meeting 4/5 STG's and already meeting majority of LTG's. Pt still reports more stiffness on Lt side w/ occasional shoulder discomfort and low back pain.   PERFORMANCE DEFICITS: in functional skills including ADLs, IADLs, coordination, dexterity, ROM, strength, pain, flexibility, Fine motor control, Gross motor control, mobility, balance, body mechanics, endurance, decreased knowledge of use of DME, and UE functional use, cognitive skills including memory.   IMPAIRMENTS: are limiting patient from ADLs, IADLs, leisure, and social participation.   COMORBIDITIES:  may have co-morbidities  that affects occupational performance. Patient will benefit from skilled OT to address above impairments and improve overall function.  MODIFICATION OR ASSISTANCE TO COMPLETE EVALUATION: No modification of tasks or assist necessary to complete an evaluation.  OT OCCUPATIONAL PROFILE AND HISTORY: Detailed assessment: Review of records and additional review of physical, cognitive, psychosocial history related to current functional performance.  CLINICAL DECISION MAKING:  Moderate - several treatment options, min-mod task modification necessary  REHAB POTENTIAL: Good  EVALUATION COMPLEXITY: Moderate    PLAN:  OT FREQUENCY: 2x/week  OT DURATION: 8 weeks  PLANNED INTERVENTIONS: 97535 self care/ADL training, 02889 therapeutic exercise, 97530 therapeutic activity, 97112 neuromuscular re-education, 97140 manual therapy, 97010 moist heat, 97750 Physical Performance Testing, 02239 Orthotic Initial,  02236 Orthotic/Prosthetic subsequent, passive range of motion, functional mobility training, visual/perceptual remediation/compensation, patient/family education, and DME and/or AE instructions  RECOMMENDED OTHER SERVICES: none at this time (Pt has SLP evaluation scheduled)   CONSULTED AND AGREED WITH PLAN OF CARE: Patient  PLAN FOR NEXT SESSION: step pattern with reciprocal inhibition,  Add modifications to PWR! All 4-s in notebook, blaze pods if time allows   Burnard JINNY Roads, OT 01/31/2024, 12:42 PM

## 2024-02-02 ENCOUNTER — Ambulatory Visit: Admitting: Occupational Therapy

## 2024-02-02 ENCOUNTER — Encounter: Payer: Self-pay | Admitting: Occupational Therapy

## 2024-02-02 DIAGNOSIS — R29898 Other symptoms and signs involving the musculoskeletal system: Secondary | ICD-10-CM

## 2024-02-02 DIAGNOSIS — R29818 Other symptoms and signs involving the nervous system: Secondary | ICD-10-CM

## 2024-02-02 DIAGNOSIS — R2681 Unsteadiness on feet: Secondary | ICD-10-CM

## 2024-02-02 DIAGNOSIS — R293 Abnormal posture: Secondary | ICD-10-CM

## 2024-02-02 DIAGNOSIS — R278 Other lack of coordination: Secondary | ICD-10-CM

## 2024-02-02 NOTE — Therapy (Signed)
 OUTPATIENT OCCUPATIONAL THERAPY [facility]  Patient Name: Ricardo Cooper MRN: [id number] DOB:May 18, 1945, 78 y.o., male Today's Date: 02/02/2024  PCP: [name], MD REFERRING PROVIDER: Evonnie Asberry RAMAN, DO  END OF SESSION:  OT End of Session - 02/02/24 0936     Visit Number 10    Number of Visits 17    Date for Recertification  02/21/24    Authorization Type [employer]    Progress Note Due on Visit 10    OT Start Time 0932    OT Stop Time 1015    OT Time Calculation (min) 43 min    Activity Tolerance Patient tolerated treatment well    Behavior During Therapy WFL for tasks assessed/performed          Past Medical History:  Diagnosis Date   Allergic rhinitis    Allergy    Cataract    beginnings of cataracts    CHF (congestive heart failure) (HCC)    COPD (chronic obstructive pulmonary disease) (HCC)    Diverticulosis    GERD (gastroesophageal reflux disease)    no issues with CPAP-1999   HTN (hypertension)    Hyperlipidemia    Obesity    Obstructive sleep apnea on CPAP    Paroxysmal atrial fibrillation (HCC) 2005   past trauma and infection   Skin cancer    basel cell on nose,arm,leg   Sleep apnea    with CPAP   Ulcerative colitis    Ulcerative colitis, left sided (HCC)    Past Surgical History:  Procedure Laterality Date   BACK SURGERY  2005   BELPHAROPTOSIS REPAIR     bilateral   COLONOSCOPY  2013   Dr. Albertus    FETAL SURGERY FOR CONGENITAL HERNIA  2001   infection in chest that had to be cleaned out     Staph Infection   POLYPECTOMY     SKIN CANCER EXCISION     squamous and basal cell   TONSILLECTOMY     tubular adenoma  2013   UMBILICAL HERNIA REPAIR     VASECTOMY     Patient Active Problem List   Diagnosis Date Noted   History of colonic polyps 04/28/2022   Obesity (BMI 30-39.9) 07/02/2021   CAP (community acquired pneumonia) 03/18/2021   Plantar fasciitis 01/18/2020   Pain due to onychomycosis of toenails  of both feet 09/18/2019   Multiple lung nodules 06/07/2018   Influenza with respiratory manifestation 06/06/2015   Ulcerative colitis with complication (HCC) 05/03/2011   PURE HYPERCHOLESTEROLEMIA 03/23/2010   PRECORDIAL PAIN 03/23/2010   CHEST TIGHTNESS-PRESSURE-OTHER 03/18/2010   DYSPNEA 11/19/2008   HYPERLIPIDEMIA 02/01/2008   HYPERTENSION 02/01/2008   ATRIAL FIBRILLATION 02/01/2008   Allergic rhinitis 02/01/2008   COPD GOLD II 02/01/2008   Obstructive sleep apnea 02/01/2008    ONSET DATE: 12/06/2023  REFERRING DIAG: G20.A1 (ICD-10-CM) - Parkinson's disease without dyskinesia or fluctuating manifestations (HCC)  THERAPY DIAG:  Other symptoms and signs involving the nervous system  Abnormal posture  Other lack of coordination  Unsteadiness on feet  Other symptoms and signs involving the musculoskeletal system  Rationale for Evaluation and Treatment: Rehabilitation  SUBJECTIVE:   SUBJECTIVE STATEMENT: No pain today, just stiffness Pt accompanied by: self  PERTINENT HISTORY: PMH: Parkinsonism (recently diagnosed 10/2023), CHF, COPD, HTN, HLD, hx of skin cancer, hx of back injury in 2005   Per Dr. Evonnie: left hand rest tremor for 3 months. Notes from primary care indicate that patient has had bradykinesia with  shuffling gait since 2023, now with more recent tremor.   PRECAUTIONS: None  WEIGHT BEARING RESTRICTIONS: No  PAIN:  Are you having pain? No  FALLS: Has patient fallen in last 6 months? No  LIVING ENVIRONMENT: Lives with: lives with their spouse Lives in: House/apartment Stairs: Yes: External: 3-4 steps; on right going up Has following equipment at home: Grab bars   PLOF: Independent and Vocation/Vocational requirements: was a Pharmacist, community for 25 years  Notes has a hard time with getting his socks on, esp on the L side   PATIENT GOALS: Wants to improve balance, improve with ADLS  OBJECTIVE:  Note: Objective measures were completed at  Evaluation unless otherwise noted.  HAND DOMINANCE: Right  ADLs: Overall ADLs: difficulty donning sock Lt side Transfers/ambulation related to ADLs: independent Eating: Independent Grooming: independent UB Dressing: more difficulty with jacket and buttons LB Dressing: more difficulty w/ Lt shoe and sock Toileting: difficulty w/ wiping in back  Bathing: mod I w/ walk in shower and 1 grab bar Tub Shower transfers: mod I  Equipment: Grab bars and built in shower seat  IADLs: Shopping: pt/wife go together Light housekeeping: pt doing Presenter, broadcasting and taking out trash Meal Prep: independent  Community mobility: difficulty turning neck to change lanes, etc.  Medication management: independent  Financial management: wife does Handwriting: 90% legible and writes quickly (mild micrographia if writing longer)   MOBILITY STATUS: Independent  POSTURE COMMENTS:  rounded shoulders and increased thoracic kyphosis   FUNCTIONAL OUTCOME MEASURES: Fastening/unfastening 3 buttons: 35.22 sec.  Physical performance test: PPT#2 (simulated eating) 11.66 & PPT#4 (donning/doffing jacket): 16.32 (w/ hospital gown)   COORDINATION: 9 Hole Peg test: Right: 28.57 sec; Left: 30.03 sec Box and Blocks:  Right 51 blocks, Left 42 blocks  UE ROM:  RUE AROM WNL's, LUE AROM WFL's w/ decreased end range shoulder flexion (pt diagnosed with frozen shoulder) and decreased elbow extension LUE   SENSATION: WFL  MUSCLE TONE: not tested  COGNITION: Overall cognitive status: pt reports short term memory deficits  OBSERVATIONS: Hypokinesia and stiffness in neck and Lt side (shoulder, leg), decreased voice volume                                                                                                                    TREATMENT:   Added modifications to PWR! Twist and Step in All 4's in pt's notebook  Pt performing various stepping patterns bilaterally followed by adding cognitive challenge w/ cognitive  inhibition in varying degrees w/ slightly compromised motor skills and increased LOB stepping backwards.   Standing in corner - working on bilateral ipsilateral and contralateral high and low targeted reaching to numbered targets in various numerical orders for cognitive challenge, trunk rotation, stretch, wt shifts/balance  UBE x 5 mintues, level 5 for UE strength/endurance keeping RPM > 40  Pt stepping on river stones forwards w/ mild difficulty, then backwards w/ max difficulty and LOB several times. Pt then stepping forwards on river stones holding weighted ball and  incorporating some UE movement and trunk rotation b/t steps while on stones  Pt encouraged to stand w/ one hand countertop support and work on stepping backwards and step back rock movements w/ full support on back foot at home    PATIENT EDUCATION: Education details: see above Person educated: Patient Education method: Programmer, multimedia, Facilities manager, Verbal cues, and Handouts Education comprehension: verbalized understanding, returned demonstration, verbal cues required, and needs further education  HOME EXERCISE PROGRAM: 01/03/24: ALYSSA! Supine, PWR! Hands 01/05/24: coordination HEP  01/10/24: PWR! Sitting, handwriting strategies, ADL strategies 01/12/24: cape method (for jacket), PD ex chart 01/17/24: ways to prevent future PD related complications 01/19/24: ways to keep thinking skills sharp and memory strategies, community resources  GOALS: Goals reviewed with patient? Yes  SHORT TERM GOALS: Target date: 01/22/24  Independent with PD specific HEP   Baseline: Goal status: MET  2.  Pt will verbalize understanding of adaptive strategies to increase ease with ADLS/IADLS   Baseline:  Goal status: MET  3.  Pt to report greater ease with donning shoes/socks, jacket, and buttons Baseline:  Goal status: MET   4.  Pt will demonstrate increased ease with dressing as evidenced by decreasing PPT#4 (don/ doff jacket) to 12 secs or  less  Baseline: 16 sec, 01/17/24 = 14 sec. With his own jacket Goal status: NOT MET (14.85 sec)   5.  Pt to improve LUE function as evidenced by performing 46 blocks on Box & Blocks test Baseline: 42 blocks (Rt = 51)  Goal status: MET (01/31/24: 47 blocks)    LONG TERM GOALS: Target date: 02/21/24  Pt will verbalize understanding of ways to prevent future PD related complications and appropriate community resources prn  Baseline:  Goal status: MET  2.  Pt will verbalize understanding of ways to keep thinking skills sharp and ways to compensate for STM changes in the future  Baseline:  Goal status: MET   3.  Pt will demonstrate improved ease with fastening buttons as evidenced by decreasing 3 button/unbutton time by 5 seconds  Baseline: 35 sec Goal status: MET (01/31/24: 25 sec)   4.  Pt will demonstrate improved fine motor coordination for ADLs as evidenced by decreasing 9 hole peg test score for Rt hand by 3 secs  Baseline: 28 sec Goal status: INITIAL  5.  Pt will demonstrate improved fine motor coordination for ADLs as evidenced by decreasing 9 hole peg test score for Lt hand by 5 secs  Baseline: 30 sec Goal status: INITIAL   ASSESSMENT:  CLINICAL IMPRESSION: Patient progressing towards all goals meeting 4/5 STG's and already meeting majority of LTG's. Pt still reports more stiffness on Lt side w/ occasional shoulder discomfort and low back pain.   PERFORMANCE DEFICITS: in functional skills including ADLs, IADLs, coordination, dexterity, ROM, strength, pain, flexibility, Fine motor control, Gross motor control, mobility, balance, body mechanics, endurance, decreased knowledge of use of DME, and UE functional use, cognitive skills including memory.   IMPAIRMENTS: are limiting patient from ADLs, IADLs, leisure, and social participation.   COMORBIDITIES:  may have co-morbidities  that affects occupational performance. Patient will benefit from skilled OT to address above  impairments and improve overall function.  MODIFICATION OR ASSISTANCE TO COMPLETE EVALUATION: No modification of tasks or assist necessary to complete an evaluation.  OT OCCUPATIONAL PROFILE AND HISTORY: Detailed assessment: Review of records and additional review of physical, cognitive, psychosocial history related to current functional performance.  CLINICAL DECISION MAKING: Moderate - several treatment options, min-mod task modification necessary  REHAB POTENTIAL: Good  EVALUATION COMPLEXITY: Moderate    PLAN:  OT FREQUENCY: 2x/week  OT DURATION: 8 weeks  PLANNED INTERVENTIONS: 97535 self care/ADL training, 02889 therapeutic exercise, 97530 therapeutic activity, 97112 neuromuscular re-education, 97140 manual therapy, 97010 moist heat, 97750 Physical Performance Testing, 02239 Orthotic Initial, 97763 Orthotic/Prosthetic subsequent, passive range of motion, functional mobility training, visual/perceptual remediation/compensation, patient/family education, and DME and/or AE instructions  RECOMMENDED OTHER SERVICES: none at this time (Pt has SLP evaluation scheduled)   CONSULTED AND AGREED WITH PLAN OF CARE: Patient  PLAN FOR NEXT SESSION: continue coordination w/ dynamic standing, blaze pods and/or boomwhackers   Burnard JINNY Roads, OT 02/02/2024, 9:37 AM

## 2024-02-07 ENCOUNTER — Ambulatory Visit: Admitting: Occupational Therapy

## 2024-02-07 ENCOUNTER — Encounter: Payer: Self-pay | Admitting: Occupational Therapy

## 2024-02-07 DIAGNOSIS — R29818 Other symptoms and signs involving the nervous system: Secondary | ICD-10-CM

## 2024-02-07 DIAGNOSIS — R2681 Unsteadiness on feet: Secondary | ICD-10-CM

## 2024-02-07 DIAGNOSIS — R29898 Other symptoms and signs involving the musculoskeletal system: Secondary | ICD-10-CM

## 2024-02-07 DIAGNOSIS — R278 Other lack of coordination: Secondary | ICD-10-CM

## 2024-02-07 DIAGNOSIS — R293 Abnormal posture: Secondary | ICD-10-CM

## 2024-02-07 NOTE — Therapy (Signed)
 OUTPATIENT OCCUPATIONAL THERAPY [facility]  Patient Name: Ricardo Cooper MRN: [id number] DOB:Jul 01, 1945, 78 y.o., male Today's Date: 02/07/2024  PCP: [name], MD REFERRING PROVIDER: Evonnie Asberry RAMAN, DO  END OF SESSION:  OT End of Session - 02/07/24 1231     Visit Number 11    Number of Visits 17    Date for Recertification  02/21/24    Authorization Type [employer]    Progress Note Due on Visit 10    OT Start Time 1230    OT Stop Time 1315    OT Time Calculation (min) 45 min    Activity Tolerance Patient tolerated treatment well    Behavior During Therapy WFL for tasks assessed/performed          Past Medical History:  Diagnosis Date   Allergic rhinitis    Allergy    Cataract    beginnings of cataracts    CHF (congestive heart failure) (HCC)    COPD (chronic obstructive pulmonary disease) (HCC)    Diverticulosis    GERD (gastroesophageal reflux disease)    no issues with CPAP-1999   HTN (hypertension)    Hyperlipidemia    Obesity    Obstructive sleep apnea on CPAP    Paroxysmal atrial fibrillation (HCC) 2005   past trauma and infection   Skin cancer    basel cell on nose,arm,leg   Sleep apnea    with CPAP   Ulcerative colitis    Ulcerative colitis, left sided (HCC)    Past Surgical History:  Procedure Laterality Date   BACK SURGERY  2005   BELPHAROPTOSIS REPAIR     bilateral   COLONOSCOPY  2013   Dr. Albertus    FETAL SURGERY FOR CONGENITAL HERNIA  2001   infection in chest that had to be cleaned out     Staph Infection   POLYPECTOMY     SKIN CANCER EXCISION     squamous and basal cell   TONSILLECTOMY     tubular adenoma  2013   UMBILICAL HERNIA REPAIR     VASECTOMY     Patient Active Problem List   Diagnosis Date Noted   History of colonic polyps 04/28/2022   Obesity (BMI 30-39.9) 07/02/2021   CAP (community acquired pneumonia) 03/18/2021   Plantar fasciitis 01/18/2020   Pain due to onychomycosis of toenails  of both feet 09/18/2019   Multiple lung nodules 06/07/2018   Influenza with respiratory manifestation 06/06/2015   Ulcerative colitis with complication (HCC) 05/03/2011   PURE HYPERCHOLESTEROLEMIA 03/23/2010   PRECORDIAL PAIN 03/23/2010   CHEST TIGHTNESS-PRESSURE-OTHER 03/18/2010   DYSPNEA 11/19/2008   HYPERLIPIDEMIA 02/01/2008   HYPERTENSION 02/01/2008   ATRIAL FIBRILLATION 02/01/2008   Allergic rhinitis 02/01/2008   COPD GOLD II 02/01/2008   Obstructive sleep apnea 02/01/2008    ONSET DATE: 12/06/2023  REFERRING DIAG: G20.A1 (ICD-10-CM) - Parkinson's disease without dyskinesia or fluctuating manifestations (HCC)  THERAPY DIAG:  Other symptoms and signs involving the nervous system  Abnormal posture  Other lack of coordination  Unsteadiness on feet  Other symptoms and signs involving the musculoskeletal system  Rationale for Evaluation and Treatment: Rehabilitation  SUBJECTIVE:   SUBJECTIVE STATEMENT: No pain or falls. My Lt side is stiffer Pt accompanied by: self  PERTINENT HISTORY: PMH: Parkinsonism (recently diagnosed 10/2023), CHF, COPD, HTN, HLD, hx of skin cancer, hx of back injury in 2005   Per Dr. Evonnie: left hand rest tremor for 3 months. Notes from primary care indicate that patient  has had bradykinesia with shuffling gait since 2023, now with more recent tremor.   PRECAUTIONS: None  WEIGHT BEARING RESTRICTIONS: No  PAIN:  Are you having pain? No  FALLS: Has patient fallen in last 6 months? 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  HAND DOMINANCE: Right  ADLs: Overall ADLs: difficulty donning sock Lt side Transfers/ambulation related to ADLs: independent Eating: Independent Grooming: independent UB Dressing: more difficulty with jacket and buttons LB Dressing: more difficulty w/ Lt shoe and sock Toileting: difficulty w/ wiping in back  Bathing: mod I w/ walk in shower and 1 grab bar Tub Shower transfers: mod I  Equipment: Grab bars and built in shower seat  IADLs: Shopping: pt/wife go together Light housekeeping: pt doing Presenter, broadcasting and taking out trash Meal Prep: independent  Community mobility: difficulty turning neck to change lanes, etc.  Medication management: independent  Financial management: wife does Handwriting: 90% legible and writes quickly (mild micrographia if writing longer)   MOBILITY STATUS: Independent  POSTURE COMMENTS:  rounded shoulders and increased thoracic kyphosis   FUNCTIONAL OUTCOME MEASURES: Fastening/unfastening 3 buttons: 35.22 sec.  Physical performance test: PPT#2 (simulated eating) 11.66 & PPT#4 (donning/doffing jacket): 16.32 (w/ hospital gown)   COORDINATION: 9 Hole Peg test: Right: 28.57 sec; Left: 30.03 sec Box and Blocks:  Right 51 blocks, Left 42 blocks  UE ROM:  RUE AROM WNL's, LUE AROM WFL's w/ decreased end range shoulder flexion (pt diagnosed with frozen shoulder) and decreased elbow extension LUE   SENSATION: WFL  MUSCLE TONE: not tested  COGNITION: Overall cognitive status: pt reports short term memory deficits  OBSERVATIONS: Hypokinesia and stiffness in neck and Lt side (shoulder, leg), decreased voice volume                                                                                                                    TREATMENT:    Standing in corner on compliant surface- working on bilateral ipsilateral and contralateral high and low targeted reaching to numbered targets in various numerical  orders for cognitive challenge, trunk rotation, stretch, wt shifts/balance  Performing diagonal pattern high level reaching to place clothespins on antenna BUE's standing on compliant surface  Performing PWR! Up and Rock standing using boom whackers for auditory cues to use large amplitude movements. Performing PWR! Twist standing at wall w/ cues to open hands back against wall. Performing PWR! Step to targets on floor (agility dots) Progressed to stepping over agility dots to front and side bilaterally while adding eye boosts.   Pt instructed on how he  can adapt for balance prn (against wall, chair in front of him, etc)  Pt also shown stretch for shoulder, forearms, and wrists seated w/ trunk extension and BUE wt bearing w/ arms slightly behind him (d/t stiffness Lt shoulder, FA sup, and wrist ext). Pt also shown stretch at wall for forearm supination and wrist extension turning fingers out and down as able while against wall.   Pt performing coordination activities standing in corner on compliant surface (rotating golf balls, holding deck of cards and pushing top card off w/ thumb, etc) bilateral hands.     PATIENT EDUCATION: Education details: see above Person educated: Patient Education method: Explanation, Demonstration, Verbal cues, and Handouts Education comprehension: verbalized understanding, returned demonstration, verbal cues required, and needs further education  HOME EXERCISE PROGRAM: 01/03/24: PWR! Supine, PWR! Hands 01/05/24: coordination HEP  01/10/24: PWR! Sitting, handwriting strategies, ADL strategies 01/12/24: cape method (for jacket), PD ex chart 01/17/24: ways to prevent future PD related complications 01/19/24: ways to keep thinking skills sharp and memory strategies, community resources  GOALS: Goals reviewed with patient? Yes  SHORT TERM GOALS: Target date: 01/22/24  Independent with PD specific HEP   Baseline: Goal status: MET  2.  Pt will verbalize  understanding of adaptive strategies to increase ease with ADLS/IADLS   Baseline:  Goal status: MET  3.  Pt to report greater ease with donning shoes/socks, jacket, and buttons Baseline:  Goal status: MET   4.  Pt will demonstrate increased ease with dressing as evidenced by decreasing PPT#4 (don/ doff jacket) to 12 secs or less  Baseline: 16 sec, 01/17/24 = 14 sec. With his own jacket Goal status: NOT MET (14.85 sec)   5.  Pt to improve LUE function as evidenced by performing 46 blocks on Box & Blocks test Baseline: 42 blocks (Rt = 51)  Goal status: MET (01/31/24: 47 blocks)    LONG TERM GOALS: Target date: 02/21/24  Pt will verbalize understanding of ways to prevent future PD related complications and appropriate community resources prn  Baseline:  Goal status: MET  2.  Pt will verbalize understanding of ways to keep thinking skills sharp and ways to compensate for STM changes in the future  Baseline:  Goal status: MET   3.  Pt will demonstrate improved ease with fastening buttons as evidenced by decreasing 3 button/unbutton time by 5 seconds  Baseline: 35 sec Goal status: MET (01/31/24: 25 sec)   4.  Pt will demonstrate improved fine motor coordination for ADLs as evidenced by decreasing 9 hole peg test score for Rt hand by 3 secs  Baseline: 28 sec Goal status: INITIAL  5.  Pt will demonstrate improved fine motor coordination for ADLs as evidenced by decreasing 9 hole peg test score for Lt hand by 5 secs  Baseline: 30 sec Goal status: INITIAL   ASSESSMENT:  CLINICAL IMPRESSION: Patient progressing towards all goals meeting 4/5 STG's and already meeting majority of LTG's. Pt still reports more stiffness on Lt side w/ occasional shoulder discomfort and low back pain.   PERFORMANCE DEFICITS: in functional skills including ADLs, IADLs, coordination, dexterity, ROM, strength, pain, flexibility, Fine motor control, Gross motor control, mobility, balance, body mechanics,  endurance, decreased knowledge of use of DME, and UE functional use, cognitive skills including memory.   IMPAIRMENTS: are limiting patient from ADLs, IADLs, leisure, and social participation.   COMORBIDITIES:  may have co-morbidities  that affects occupational performance. Patient will benefit from skilled OT to address above impairments and improve  overall function.  MODIFICATION OR ASSISTANCE TO COMPLETE EVALUATION: No modification of tasks or assist necessary to complete an evaluation.  OT OCCUPATIONAL PROFILE AND HISTORY: Detailed assessment: Review of records and additional review of physical, cognitive, psychosocial history related to current functional performance.  CLINICAL DECISION MAKING: Moderate - several treatment options, min-mod task modification necessary  REHAB POTENTIAL: Good  EVALUATION COMPLEXITY: Moderate    PLAN:  OT FREQUENCY: 2x/week  OT DURATION: 8 weeks  PLANNED INTERVENTIONS: 97535 self care/ADL training, 02889 therapeutic exercise, 97530 therapeutic activity, 97112 neuromuscular re-education, 97140 manual therapy, 97010 moist heat, 97750 Physical Performance Testing, 02239 Orthotic Initial, 97763 Orthotic/Prosthetic subsequent, passive range of motion, functional mobility training, visual/perceptual remediation/compensation, patient/family education, and DME and/or AE instructions  RECOMMENDED OTHER SERVICES: none at this time (Pt has SLP evaluation scheduled)   CONSULTED AND AGREED WITH PLAN OF CARE: Patient  PLAN FOR NEXT SESSION: continue coordination w/ dynamic standing, blaze pods, UBE   Burnard JINNY Roads, OT 02/07/2024, 12:32 PM

## 2024-02-09 ENCOUNTER — Ambulatory Visit: Admitting: Occupational Therapy

## 2024-02-09 ENCOUNTER — Encounter: Payer: Self-pay | Admitting: Occupational Therapy

## 2024-02-09 DIAGNOSIS — R2681 Unsteadiness on feet: Secondary | ICD-10-CM

## 2024-02-09 DIAGNOSIS — R29818 Other symptoms and signs involving the nervous system: Secondary | ICD-10-CM | POA: Diagnosis not present

## 2024-02-09 DIAGNOSIS — R29898 Other symptoms and signs involving the musculoskeletal system: Secondary | ICD-10-CM

## 2024-02-09 DIAGNOSIS — R278 Other lack of coordination: Secondary | ICD-10-CM

## 2024-02-09 NOTE — Therapy (Signed)
 OUTPATIENT OCCUPATIONAL THERAPY [facility]  Patient Name: Ricardo Cooper MRN: [id number] DOB:03-Mar-1946, 78 y.o., male Today's Date: 02/09/2024  PCP: [name], MD REFERRING PROVIDER: Evonnie Asberry RAMAN, DO  END OF SESSION:  OT End of Session - 02/09/24 0858     Visit Number 12    Number of Visits 17    Date for Recertification  02/21/24    Authorization Type [employer]    Progress Note Due on Visit 10    OT Start Time 0854    OT Stop Time 0932    OT Time Calculation (min) 38 min    Activity Tolerance Patient tolerated treatment well    Behavior During Therapy WFL for tasks assessed/performed          Past Medical History:  Diagnosis Date   Allergic rhinitis    Allergy    Cataract    beginnings of cataracts    CHF (congestive heart failure) (HCC)    COPD (chronic obstructive pulmonary disease) (HCC)    Diverticulosis    GERD (gastroesophageal reflux disease)    no issues with CPAP-1999   HTN (hypertension)    Hyperlipidemia    Obesity    Obstructive sleep apnea on CPAP    Paroxysmal atrial fibrillation (HCC) 2005   past trauma and infection   Skin cancer    basel cell on nose,arm,leg   Sleep apnea    with CPAP   Ulcerative colitis    Ulcerative colitis, left sided (HCC)    Past Surgical History:  Procedure Laterality Date   BACK SURGERY  2005   BELPHAROPTOSIS REPAIR     bilateral   COLONOSCOPY  2013   Dr. Albertus    FETAL SURGERY FOR CONGENITAL HERNIA  2001   infection in chest that had to be cleaned out     Staph Infection   POLYPECTOMY     SKIN CANCER EXCISION     squamous and basal cell   TONSILLECTOMY     tubular adenoma  2013   UMBILICAL HERNIA REPAIR     VASECTOMY     Patient Active Problem List   Diagnosis Date Noted   History of colonic polyps 04/28/2022   Obesity (BMI 30-39.9) 07/02/2021   CAP (community acquired pneumonia) 03/18/2021   Plantar fasciitis 01/18/2020   Pain due to onychomycosis of toenails  of both feet 09/18/2019   Multiple lung nodules 06/07/2018   Influenza with respiratory manifestation 06/06/2015   Ulcerative colitis with complication (HCC) 05/03/2011   PURE HYPERCHOLESTEROLEMIA 03/23/2010   PRECORDIAL PAIN 03/23/2010   CHEST TIGHTNESS-PRESSURE-OTHER 03/18/2010   DYSPNEA 11/19/2008   HYPERLIPIDEMIA 02/01/2008   HYPERTENSION 02/01/2008   ATRIAL FIBRILLATION 02/01/2008   Allergic rhinitis 02/01/2008   COPD GOLD II 02/01/2008   Obstructive sleep apnea 02/01/2008    ONSET DATE: 12/06/2023  REFERRING DIAG: G20.A1 (ICD-10-CM) - Parkinson's disease without dyskinesia or fluctuating manifestations (HCC)  THERAPY DIAG:  Other symptoms and signs involving the nervous system  Other lack of coordination  Unsteadiness on feet  Other symptoms and signs involving the musculoskeletal system  Rationale for Evaluation and Treatment: Rehabilitation  SUBJECTIVE:   SUBJECTIVE STATEMENT: No pain or falls. My Lt side is always stiffer Pt accompanied by: self  PERTINENT HISTORY: PMH: Parkinsonism (recently diagnosed 10/2023), CHF, COPD, HTN, HLD, hx of skin cancer, hx of back injury in 2005   Per Dr. Evonnie: left hand rest tremor for 3 months. Notes from primary care indicate that patient has had  bradykinesia with shuffling gait since 2023, now with more recent tremor.   PRECAUTIONS: None  WEIGHT BEARING RESTRICTIONS: No  PAIN:  Are you having pain? No  FALLS: Has patient fallen in last 6 months? No  LIVING ENVIRONMENT: Lives with: lives with their spouse Lives in: House/apartment Stairs: Yes: External: 3-4 steps; on right going up Has following equipment at home: Grab bars   PLOF: Independent and Vocation/Vocational requirements: was a Pharmacist, community for 25 years  Notes has a hard time with getting his socks on, esp on the L side   PATIENT GOALS: Wants to improve balance, improve with ADLS  OBJECTIVE:  Note: Objective measures were completed at  Evaluation unless otherwise noted.  HAND DOMINANCE: Right  ADLs: Overall ADLs: difficulty donning sock Lt side Transfers/ambulation related to ADLs: independent Eating: Independent Grooming: independent UB Dressing: more difficulty with jacket and buttons LB Dressing: more difficulty w/ Lt shoe and sock Toileting: difficulty w/ wiping in back  Bathing: mod I w/ walk in shower and 1 grab bar Tub Shower transfers: mod I  Equipment: Grab bars and built in shower seat  IADLs: Shopping: pt/wife go together Light housekeeping: pt doing Presenter, broadcasting and taking out trash Meal Prep: independent  Community mobility: difficulty turning neck to change lanes, etc.  Medication management: independent  Financial management: wife does Handwriting: 90% legible and writes quickly (mild micrographia if writing longer)   MOBILITY STATUS: Independent  POSTURE COMMENTS:  rounded shoulders and increased thoracic kyphosis   FUNCTIONAL OUTCOME MEASURES: Fastening/unfastening 3 buttons: 35.22 sec.  Physical performance test: PPT#2 (simulated eating) 11.66 & PPT#4 (donning/doffing jacket): 16.32 (w/ hospital gown)   COORDINATION: 9 Hole Peg test: Right: 28.57 sec; Left: 30.03 sec Box and Blocks:  Right 51 blocks, Left 42 blocks  UE ROM:  RUE AROM WNL's, LUE AROM WFL's w/ decreased end range shoulder flexion (pt diagnosed with frozen shoulder) and decreased elbow extension LUE   SENSATION: WFL  MUSCLE TONE: not tested  COGNITION: Overall cognitive status: pt reports short term memory deficits  OBSERVATIONS: Hypokinesia and stiffness in neck and Lt side (shoulder, leg), decreased voice volume                                                                                                                    TREATMENT:    UBE x 5 mintues, level 5 for UE strength/endurance keeping RPM > 40 for cardiovascular benefit  Pt standing at mirror to hit visual targets that light up (blaze pods) w/ LE wt  shifts, high and low level reaching w/ ipsilateral and contralateral reaching at various cycles for cognitive challenge. Also hit targeted color w/ 2 distracting colors for cognitive inhibition/challenge - pt with improved speed/reaction time after practice but continued to have delayed time responding in superior fields especially on Lt side  Pt issued shoulder flexion ex in supine to help manage Lt shoulder stiffness - pt instructed to perform in the am and again at night while in bed - see  pt instructions for details.  Pt required cues to slow down    PATIENT EDUCATION: Education details: see above Person educated: Patient Education method: Explanation, Demonstration, Verbal cues, and Handouts Education comprehension: verbalized understanding, returned demonstration, verbal cues required, and needs further education  HOME EXERCISE PROGRAM: 01/03/24: PWR! Supine, PWR! Hands 01/05/24: coordination HEP  01/10/24: PWR! Sitting, handwriting strategies, ADL strategies 01/12/24: cape method (for jacket), PD ex chart 01/17/24: ways to prevent future PD related complications 01/19/24: ways to keep thinking skills sharp and memory strategies, community resources 02/09/24: shoulder flex ex supine   GOALS: Goals reviewed with patient? Yes  SHORT TERM GOALS: Target date: 01/22/24  Independent with PD specific HEP   Baseline: Goal status: MET  2.  Pt will verbalize understanding of adaptive strategies to increase ease with ADLS/IADLS   Baseline:  Goal status: MET  3.  Pt to report greater ease with donning shoes/socks, jacket, and buttons Baseline:  Goal status: MET   4.  Pt will demonstrate increased ease with dressing as evidenced by decreasing PPT#4 (don/ doff jacket) to 12 secs or less  Baseline: 16 sec, 01/17/24 = 14 sec. With his own jacket Goal status: NOT MET (14.85 sec)   5.  Pt to improve LUE function as evidenced by performing 46 blocks on Box & Blocks test Baseline: 42 blocks (Rt  = 51)  Goal status: MET (01/31/24: 47 blocks)    LONG TERM GOALS: Target date: 02/21/24  Pt will verbalize understanding of ways to prevent future PD related complications and appropriate community resources prn  Baseline:  Goal status: MET  2.  Pt will verbalize understanding of ways to keep thinking skills sharp and ways to compensate for STM changes in the future  Baseline:  Goal status: MET   3.  Pt will demonstrate improved ease with fastening buttons as evidenced by decreasing 3 button/unbutton time by 5 seconds  Baseline: 35 sec Goal status: MET (01/31/24: 25 sec)   4.  Pt will demonstrate improved fine motor coordination for ADLs as evidenced by decreasing 9 hole peg test score for Rt hand by 3 secs  Baseline: 28 sec Goal status: INITIAL  5.  Pt will demonstrate improved fine motor coordination for ADLs as evidenced by decreasing 9 hole peg test score for Lt hand by 5 secs  Baseline: 30 sec Goal status: INITIAL   ASSESSMENT:  CLINICAL IMPRESSION: Patient progressing towards all goals meeting 4/5 STG's and already meeting majority of LTG's. Pt still reports more stiffness on Lt side w/ occasional shoulder discomfort and low back pain.   PERFORMANCE DEFICITS: in functional skills including ADLs, IADLs, coordination, dexterity, ROM, strength, pain, flexibility, Fine motor control, Gross motor control, mobility, balance, body mechanics, endurance, decreased knowledge of use of DME, and UE functional use, cognitive skills including memory.   IMPAIRMENTS: are limiting patient from ADLs, IADLs, leisure, and social participation.   COMORBIDITIES:  may have co-morbidities  that affects occupational performance. Patient will benefit from skilled OT to address above impairments and improve overall function.  MODIFICATION OR ASSISTANCE TO COMPLETE EVALUATION: No modification of tasks or assist necessary to complete an evaluation.  OT OCCUPATIONAL PROFILE AND HISTORY: Detailed  assessment: Review of records and additional review of physical, cognitive, psychosocial history related to current functional performance.  CLINICAL DECISION MAKING: Moderate - several treatment options, min-mod task modification necessary  REHAB POTENTIAL: Good  EVALUATION COMPLEXITY: Moderate    PLAN:  OT FREQUENCY: 2x/week  OT DURATION: 8 weeks  PLANNED  INTERVENTIONS: 97535 self care/ADL training, 02889 therapeutic exercise, 97530 therapeutic activity, 97112 neuromuscular re-education, 97140 manual therapy, 97010 moist heat, 97750 Physical Performance Testing, 02239 Orthotic Initial, [phone] Orthotic/Prosthetic subsequent, passive range of motion, functional mobility training, visual/perceptual remediation/compensation, patient/family education, and DME and/or AE instructions  RECOMMENDED OTHER SERVICES: none at this time (Pt has SLP evaluation scheduled)   CONSULTED AND AGREED WITH PLAN OF CARE: Patient  PLAN FOR NEXT SESSION: continue coordination w/ dynamic standing, assess remaining goals, d/c next week  Burnard JINNY Roads, OT 02/09/2024, 9:54 AM

## 2024-02-09 NOTE — Patient Instructions (Signed)
 Cranial Flexion: Overhead Arm Extension - Supine ([facility])    Lie with knees bent, arms beyond head, holding _paper towel roll or shoe box (palms facing). Pull ball up to above face. Repeat _10___ times per set. Do __2__ sets per session

## 2024-02-14 ENCOUNTER — Encounter: Payer: Self-pay | Admitting: Occupational Therapy

## 2024-02-14 ENCOUNTER — Ambulatory Visit: Admitting: Occupational Therapy

## 2024-02-14 DIAGNOSIS — R278 Other lack of coordination: Secondary | ICD-10-CM

## 2024-02-14 DIAGNOSIS — R29898 Other symptoms and signs involving the musculoskeletal system: Secondary | ICD-10-CM

## 2024-02-14 DIAGNOSIS — R29818 Other symptoms and signs involving the nervous system: Secondary | ICD-10-CM | POA: Diagnosis not present

## 2024-02-14 DIAGNOSIS — R2681 Unsteadiness on feet: Secondary | ICD-10-CM

## 2024-02-14 NOTE — Therapy (Signed)
 OUTPATIENT OCCUPATIONAL THERAPY [facility]  Patient Name: Ricardo Cooper MRN: [id number] DOB:27-Mar-1946, 78 y.o., male Today's Date: 02/14/2024  PCP: [name], MD REFERRING PROVIDER: Evonnie Asberry RAMAN, DO  END OF SESSION:  OT End of Session - 02/14/24 1233     Visit Number 13    Number of Visits 17    Date for Recertification  02/21/24    Authorization Type [employer]    Progress Note Due on Visit 10    OT Start Time 1230    OT Stop Time 1315    OT Time Calculation (min) 45 min    Activity Tolerance Patient tolerated treatment well    Behavior During Therapy WFL for tasks assessed/performed          Past Medical History:  Diagnosis Date   Allergic rhinitis    Allergy    Cataract    beginnings of cataracts    CHF (congestive heart failure) (HCC)    COPD (chronic obstructive pulmonary disease) (HCC)    Diverticulosis    GERD (gastroesophageal reflux disease)    no issues with CPAP-1999   HTN (hypertension)    Hyperlipidemia    Obesity    Obstructive sleep apnea on CPAP    Paroxysmal atrial fibrillation (HCC) 2005   past trauma and infection   Skin cancer    basel cell on nose,arm,leg   Sleep apnea    with CPAP   Ulcerative colitis    Ulcerative colitis, left sided (HCC)    Past Surgical History:  Procedure Laterality Date   BACK SURGERY  2005   BELPHAROPTOSIS REPAIR     bilateral   COLONOSCOPY  2013   Dr. Albertus    FETAL SURGERY FOR CONGENITAL HERNIA  2001   infection in chest that had to be cleaned out     Staph Infection   POLYPECTOMY     SKIN CANCER EXCISION     squamous and basal cell   TONSILLECTOMY     tubular adenoma  2013   UMBILICAL HERNIA REPAIR     VASECTOMY     Patient Active Problem List   Diagnosis Date Noted   History of colonic polyps 04/28/2022   Obesity (BMI 30-39.9) 07/02/2021   CAP (community acquired pneumonia) 03/18/2021   Plantar fasciitis 01/18/2020   Pain due to onychomycosis of toenails  of both feet 09/18/2019   Multiple lung nodules 06/07/2018   Influenza with respiratory manifestation 06/06/2015   Ulcerative colitis with complication (HCC) 05/03/2011   PURE HYPERCHOLESTEROLEMIA 03/23/2010   PRECORDIAL PAIN 03/23/2010   CHEST TIGHTNESS-PRESSURE-OTHER 03/18/2010   DYSPNEA 11/19/2008   HYPERLIPIDEMIA 02/01/2008   HYPERTENSION 02/01/2008   ATRIAL FIBRILLATION 02/01/2008   Allergic rhinitis 02/01/2008   COPD GOLD II 02/01/2008   Obstructive sleep apnea 02/01/2008    ONSET DATE: 12/06/2023  REFERRING DIAG: G20.A1 (ICD-10-CM) - Parkinson's disease without dyskinesia or fluctuating manifestations (HCC)  THERAPY DIAG:  Other symptoms and signs involving the nervous system  Other lack of coordination  Unsteadiness on feet  Other symptoms and signs involving the musculoskeletal system  Rationale for Evaluation and Treatment: Rehabilitation  SUBJECTIVE:   SUBJECTIVE STATEMENT: No pain or falls. My Lt side is always stiffer but better this week than last week (Lt shoulder)  Pt accompanied by: self  PERTINENT HISTORY: PMH: Parkinsonism (recently diagnosed 10/2023), CHF, COPD, HTN, HLD, hx of skin cancer, hx of back injury in 2005   Per Dr. Evonnie: left hand rest tremor for 3  months. Notes from primary care indicate that patient has had bradykinesia with shuffling gait since 2023, now with more recent tremor.   PRECAUTIONS: None  WEIGHT BEARING RESTRICTIONS: No  PAIN:  Are you having pain? No  FALLS: Has patient fallen in last 6 months? 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  HAND DOMINANCE: Right  ADLs: Overall ADLs: difficulty donning sock Lt side Transfers/ambulation related to ADLs: independent Eating: Independent Grooming: independent UB Dressing: more difficulty with jacket and buttons LB Dressing: more difficulty w/ Lt shoe and sock Toileting: difficulty w/ wiping in back  Bathing: mod I w/ walk in shower and 1 grab bar Tub Shower transfers: mod I  Equipment: Grab bars and built in shower seat  IADLs: Shopping: pt/wife go together Light housekeeping: pt doing Presenter, broadcasting and taking out trash Meal Prep: independent  Community mobility: difficulty turning neck to change lanes, etc.  Medication management: independent  Financial management: wife does Handwriting: 90% legible and writes quickly (mild micrographia if writing longer)   MOBILITY STATUS: Independent  POSTURE COMMENTS:  rounded shoulders and increased thoracic kyphosis   FUNCTIONAL OUTCOME MEASURES: Fastening/unfastening 3 buttons: 35.22 sec.  Physical performance test: PPT#2 (simulated eating) 11.66 & PPT#4 (donning/doffing jacket): 16.32 (w/ hospital gown)   COORDINATION: 9 Hole Peg test: Right: 28.57 sec; Left: 30.03 sec Box and Blocks:  Right 51 blocks, Left 42 blocks  UE ROM:  RUE AROM WNL's, LUE AROM WFL's w/ decreased end range shoulder flexion (pt diagnosed with frozen shoulder) and decreased elbow extension LUE   SENSATION: WFL  MUSCLE TONE: not tested  COGNITION: Overall cognitive status: pt reports short term memory deficits  OBSERVATIONS: Hypokinesia and stiffness in neck and Lt side (shoulder, leg), decreased voice volume                                                                                                                    TREATMENT:    Practiced dynamic standing and stepping while flipping cards over and stepping to side over object to place card further away for  full elbow extension and side reaching. Pt required to step over object for high/big step and was asked to add last 2 cards for cognitive challenge. Pt with greater difficulty on Lt side  Pt tossing scarf alternating arms with ambulation - then able to add cognitive challenge for dual tasking (subtracting by 7's) w/ mod difficulty as pt fatigued  Standing in corner on compliant surface while tossing scarf and naming states w/ min to mod cues from therapist.   Use  of ribbon w/ wand to enhance larger/bigger and more powered movements: began with modified PWR! Twist standing over low surface, then using ribbon to create large circles (lasso style) and then in front of him clockwise and counterclockwise. Progressed to adding stepping patterns and LE rocking/wt shifts w/ UE movement using ribbons  By end of session, pt reported less stiffness in Lt shoulder  PATIENT EDUCATION: Education details: see above Person educated: Patient Education method: Programmer, multimedia, Demonstration, Verbal cues, and Handouts Education comprehension: verbalized understanding, returned demonstration, verbal cues required, and needs further education  HOME EXERCISE PROGRAM: 01/03/24: PWR! Supine, PWR! Hands 01/05/24: coordination HEP  01/10/24: PWR! Sitting, handwriting strategies, ADL strategies 01/12/24: cape method (for jacket), PD ex chart 01/17/24: ways to prevent future PD related complications 01/19/24: ways to keep thinking skills sharp and memory strategies, community resources 02/09/24: shoulder flex ex supine   GOALS: Goals reviewed with patient? Yes  SHORT TERM GOALS: Target date: 01/22/24  Independent with PD specific HEP   Baseline: Goal status: MET  2.  Pt will verbalize understanding of adaptive strategies to increase ease with ADLS/IADLS   Baseline:  Goal status: MET  3.  Pt to report greater ease with donning shoes/socks, jacket, and buttons Baseline:  Goal status: MET   4.  Pt will demonstrate  increased ease with dressing as evidenced by decreasing PPT#4 (don/ doff jacket) to 12 secs or less  Baseline: 16 sec, 01/17/24 = 14 sec. With his own jacket Goal status: NOT MET (14.85 sec)   5.  Pt to improve LUE function as evidenced by performing 46 blocks on Box & Blocks test Baseline: 42 blocks (Rt = 51)  Goal status: MET (01/31/24: 47 blocks)    LONG TERM GOALS: Target date: 02/21/24  Pt will verbalize understanding of ways to prevent future PD related complications and appropriate community resources prn  Baseline:  Goal status: MET  2.  Pt will verbalize understanding of ways to keep thinking skills sharp and ways to compensate for STM changes in the future  Baseline:  Goal status: MET   3.  Pt will demonstrate improved ease with fastening buttons as evidenced by decreasing 3 button/unbutton time by 5 seconds  Baseline: 35 sec Goal status: MET (01/31/24: 25 sec)   4.  Pt will demonstrate improved fine motor coordination for ADLs as evidenced by decreasing 9 hole peg test score for Rt hand by 3 secs  Baseline: 28 sec Goal status: INITIAL  5.  Pt will demonstrate improved fine motor coordination for ADLs as evidenced by decreasing 9 hole peg test score for Lt hand by 5 secs  Baseline: 30 sec Goal status: INITIAL   ASSESSMENT:  CLINICAL IMPRESSION: Patient progressing towards all goals meeting 4/5 STG's and already meeting majority of LTG's. Pt still reports more stiffness on Lt side w/ occasional shoulder discomfort and low back pain.   PERFORMANCE DEFICITS: in functional skills including ADLs, IADLs, coordination, dexterity, ROM, strength, pain, flexibility, Fine motor control, Gross motor control, mobility, balance, body mechanics, endurance, decreased knowledge of use of DME, and UE functional use, cognitive skills including memory.   IMPAIRMENTS: are limiting patient from ADLs, IADLs, leisure, and social participation.   COMORBIDITIES:  may have co-morbidities  that  affects occupational performance. Patient will benefit from skilled OT to address above impairments and improve overall function.  MODIFICATION OR ASSISTANCE TO COMPLETE EVALUATION: No modification of tasks or assist necessary to complete an evaluation.  OT OCCUPATIONAL PROFILE AND HISTORY: Detailed assessment: Review  of records and additional review of physical, cognitive, psychosocial history related to current functional performance.  CLINICAL DECISION MAKING: Moderate - several treatment options, min-mod task modification necessary  REHAB POTENTIAL: Good  EVALUATION COMPLEXITY: Moderate    PLAN:  OT FREQUENCY: 2x/week  OT DURATION: 8 weeks  PLANNED INTERVENTIONS: 97535 self care/ADL training, 02889 therapeutic exercise, 97530 therapeutic activity, 97112 neuromuscular re-education, 97140 manual therapy, 97010 moist heat, 97750 Physical Performance Testing, 02239 Orthotic Initial, 97763 Orthotic/Prosthetic subsequent, passive range of motion, functional mobility training, visual/perceptual remediation/compensation, patient/family education, and DME and/or AE instructions  RECOMMENDED OTHER SERVICES: none at this time (Pt has SLP evaluation scheduled)   CONSULTED AND AGREED WITH PLAN OF CARE: Patient  PLAN FOR NEXT SESSION: assess remaining goals, continue with use of ribbon on wand for bigger movements, stepping on river stones, UBE  Burnard JINNY Roads, OT 02/14/2024, 12:33 PM

## 2024-02-16 ENCOUNTER — Ambulatory Visit: Admitting: Occupational Therapy

## 2024-02-16 ENCOUNTER — Encounter: Payer: Self-pay | Admitting: Occupational Therapy

## 2024-02-16 DIAGNOSIS — R29818 Other symptoms and signs involving the nervous system: Secondary | ICD-10-CM | POA: Diagnosis not present

## 2024-02-16 DIAGNOSIS — R293 Abnormal posture: Secondary | ICD-10-CM

## 2024-02-16 DIAGNOSIS — R2681 Unsteadiness on feet: Secondary | ICD-10-CM

## 2024-02-16 DIAGNOSIS — G4733 Obstructive sleep apnea (adult) (pediatric): Secondary | ICD-10-CM | POA: Diagnosis not present

## 2024-02-16 DIAGNOSIS — R29898 Other symptoms and signs involving the musculoskeletal system: Secondary | ICD-10-CM

## 2024-02-16 DIAGNOSIS — R278 Other lack of coordination: Secondary | ICD-10-CM

## 2024-02-16 NOTE — Patient Instructions (Addendum)
   Exercises with ribbon wand: be sure to remember to practice to each side   Hold wand in front - figure 8's   Front lasso  Side lasso  Overhead lasso  PWR! Rock weight shifts to front w/ ribbon to front,   PWR! Rock weight shifts to side w/ ribbon to side (same arm to side)   PWR! Step to front w/ both arms up (Rt hand w/ ribbon and left foot stepping front, then reverse to Lt hand w/ ribbon and right foot stepping front) - WHIP ribbon back   PWR! Step to side (ribbon in same hand as foot stepping out) - WHIP ribbon out to side and back

## 2024-02-16 NOTE — Therapy (Addendum)
 OUTPATIENT OCCUPATIONAL THERAPY PARKINSON'S TREATMENT  Patient Name: Ricardo Cooper MRN: [id number] DOB:Oct 31, 1945, 78 y.o., male Today's Date: 02/16/2024  PCP: Ricardo Elsie JONETTA Mickey, MD REFERRING PROVIDER: Tat, Asberry RAMAN, DO  OCCUPATIONAL THERAPY DISCHARGE SUMMARY  Visits from Start of Care: 14  Current functional level related to goals / functional outcomes: See below: pt met 4/5 STG's and all LTG's   Remaining deficits: Stiffness Lt shoulder (however improved)  Decreased balance w/ stepping backwards    Education / Equipment: See below - extensive amount of HEP's and education   Patient agrees to discharge. Patient goals were met. Patient is being discharged due to meeting the stated rehab goals..     END OF SESSION:  OT End of Session - 02/16/24 1109     Visit Number 14    Number of Visits 17    Date for Recertification  02/21/24    Authorization Type [employer]    Progress Note Due on Visit 10    OT Start Time 1102    OT Stop Time 1145    OT Time Calculation (min) 43 min    Activity Tolerance Patient tolerated treatment well    Behavior During Therapy WFL for tasks assessed/performed          Past Medical History:  Diagnosis Date   Allergic rhinitis    Allergy    Cataract    beginnings of cataracts    CHF (congestive heart failure) (HCC)    COPD (chronic obstructive pulmonary disease) (HCC)    Diverticulosis    GERD (gastroesophageal reflux disease)    no issues with CPAP-1999   HTN (hypertension)    Hyperlipidemia    Obesity    Obstructive sleep apnea on CPAP    Paroxysmal atrial fibrillation (HCC) 2005   past trauma and infection   Skin cancer    basel cell on nose,arm,leg   Sleep apnea    with CPAP   Ulcerative colitis    Ulcerative colitis, left sided (HCC)    Past Surgical History:  Procedure Laterality Date   BACK SURGERY  2005   BELPHAROPTOSIS REPAIR     bilateral   COLONOSCOPY  2013   Dr. Albertus    FETAL SURGERY FOR  CONGENITAL HERNIA  2001   infection in chest that had to be cleaned out     Staph Infection   POLYPECTOMY     SKIN CANCER EXCISION     squamous and basal cell   TONSILLECTOMY     tubular adenoma  2013   UMBILICAL HERNIA REPAIR     VASECTOMY     Patient Active Problem List   Diagnosis Date Noted   History of colonic polyps 04/28/2022   Obesity (BMI 30-39.9) 07/02/2021   CAP (community acquired pneumonia) 03/18/2021   Plantar fasciitis 01/18/2020   Pain due to onychomycosis of toenails of both feet 09/18/2019   Multiple lung nodules 06/07/2018   Influenza with respiratory manifestation 06/06/2015   Ulcerative colitis with complication (HCC) 05/03/2011   PURE HYPERCHOLESTEROLEMIA 03/23/2010   PRECORDIAL PAIN 03/23/2010   CHEST TIGHTNESS-PRESSURE-OTHER 03/18/2010   DYSPNEA 11/19/2008   HYPERLIPIDEMIA 02/01/2008   HYPERTENSION 02/01/2008   ATRIAL FIBRILLATION 02/01/2008   Allergic rhinitis 02/01/2008   COPD GOLD II 02/01/2008   Obstructive sleep apnea 02/01/2008    ONSET DATE: 12/06/2023  REFERRING DIAG: G20.A1 (ICD-10-CM) - Parkinson's disease without dyskinesia or fluctuating manifestations (HCC)  THERAPY DIAG:  Other symptoms and signs involving the nervous system  Other lack  of coordination  Unsteadiness on feet  Other symptoms and signs involving the musculoskeletal system  Abnormal posture  Rationale for Evaluation and Treatment: Rehabilitation  SUBJECTIVE:   SUBJECTIVE STATEMENT: No pain or falls. My Lt side is always stiffer but better this week than last week (Lt shoulder)  Pt accompanied by: self  PERTINENT HISTORY: PMH: Parkinsonism (recently diagnosed 10/2023), CHF, COPD, HTN, HLD, hx of skin cancer, hx of back injury in 2005   Per Dr. Evonnie: left hand rest tremor for 3 months. Notes from primary care indicate that patient has had bradykinesia with shuffling gait since 2023, now with more recent tremor.   PRECAUTIONS: None  WEIGHT BEARING  RESTRICTIONS: No  PAIN:  Are you having pain? No  FALLS: Has patient fallen in last 6 months? No  LIVING ENVIRONMENT: Lives with: lives with their spouse Lives in: House/apartment Stairs: Yes: External: 3-4 steps; on right going up Has following equipment at home: Grab bars   PLOF: Independent and Vocation/Vocational requirements: was a Pharmacist, community for 25 years  Notes has a hard time with getting his socks on, esp on the L side   PATIENT GOALS: Wants to improve balance, improve with ADLS  OBJECTIVE:  Note: Objective measures were completed at Evaluation unless otherwise noted.  HAND DOMINANCE: Right  ADLs: Overall ADLs: difficulty donning sock Lt side Transfers/ambulation related to ADLs: independent Eating: Independent Grooming: independent UB Dressing: more difficulty with jacket and buttons LB Dressing: more difficulty w/ Lt shoe and sock Toileting: difficulty w/ wiping in back  Bathing: mod I w/ walk in shower and 1 grab bar Tub Shower transfers: mod I  Equipment: Grab bars and built in shower seat  IADLs: Shopping: pt/wife go together Light housekeeping: pt doing Presenter, broadcasting and taking out trash Meal Prep: independent  Community mobility: difficulty turning neck to change lanes, etc.  Medication management: independent  Financial management: wife does Handwriting: 90% legible and writes quickly (mild micrographia if writing longer)   MOBILITY STATUS: Independent  POSTURE COMMENTS:  rounded shoulders and increased thoracic kyphosis   FUNCTIONAL OUTCOME MEASURES: Fastening/unfastening 3 buttons: 35.22 sec.  Physical performance test: PPT#2 (simulated eating) 11.66 & PPT#4 (donning/doffing jacket): 16.32 (w/ hospital gown)   COORDINATION: 9 Hole Peg test: Right: 28.57 sec; Left: 30.03 sec Box and Blocks:  Right 51 blocks, Left 42 blocks  UE ROM:  RUE AROM WNL's, LUE AROM WFL's w/ decreased end range shoulder flexion (pt diagnosed with frozen  shoulder) and decreased elbow extension LUE   SENSATION: WFL  MUSCLE TONE: not tested  COGNITION: Overall cognitive status: pt reports short term memory deficits  OBSERVATIONS: Hypokinesia and stiffness in neck and Lt side (shoulder, leg), decreased voice volume                                                                                                                    TREATMENT:    Reviewed activities/exercises he can do with ribbon wand to promote large amplitude movements - see pt  instructions for details.   Assessed remaining 2 LTG's (9 hole peg test) - see below. Pt improved bilaterally   Pt stepping on river stones for dynamic balance w/ some LOB (gait belt used),  and more difficulty and LOB when stepping backwards. Progressed to incorporating UE challenge, trunk rotation, and squats while standing on river stones  UBE x 7 mintues, level 3 for UE strength/endurance keeping RPM > 40   PATIENT EDUCATION: Education details: see above Person educated: Patient Education method: Explanation, Demonstration, Verbal cues, and Handouts Education comprehension: verbalized understanding, returned demonstration, verbal cues required, and needs further education  HOME EXERCISE PROGRAM: 01/03/24: PWR! Supine, PWR! Hands 01/05/24: coordination HEP  01/10/24: PWR! Sitting, handwriting strategies, ADL strategies 01/12/24: cape method (for jacket), PD ex chart 01/17/24: ways to prevent future PD related complications 01/19/24: ways to keep thinking skills sharp and memory strategies, community resources 02/09/24: shoulder flex ex supine 02/16/24: ribbon wand ex's   GOALS: Goals reviewed with patient? Yes  SHORT TERM GOALS: Target date: 01/22/24  Independent with PD specific HEP   Baseline: Goal status: MET  2.  Pt will verbalize understanding of adaptive strategies to increase ease with ADLS/IADLS   Baseline:  Goal status: MET  3.  Pt to report greater ease with donning  shoes/socks, jacket, and buttons Baseline:  Goal status: MET   4.  Pt will demonstrate increased ease with dressing as evidenced by decreasing PPT#4 (don/ doff jacket) to 12 secs or less  Baseline: 16 sec, 01/17/24 = 14 sec. With his own jacket Goal status: NOT MET (14.85 sec)   5.  Pt to improve LUE function as evidenced by performing 46 blocks on Box & Blocks test Baseline: 42 blocks (Rt = 51)  Goal status: MET (01/31/24: 47 blocks)    LONG TERM GOALS: Target date: 02/21/24  Pt will verbalize understanding of ways to prevent future PD related complications and appropriate community resources prn  Baseline:  Goal status: MET  2.  Pt will verbalize understanding of ways to keep thinking skills sharp and ways to compensate for STM changes in the future  Baseline:  Goal status: MET   3.  Pt will demonstrate improved ease with fastening buttons as evidenced by decreasing 3 button/unbutton time by 5 seconds  Baseline: 35 sec Goal status: MET (01/31/24: 25 sec)   4.  Pt will demonstrate improved fine motor coordination for ADLs as evidenced by decreasing 9 hole peg test score for Rt hand by 3 secs  Baseline: 28 sec Goal status: MET (02/16/24 = 20.96 sec)   5.  Pt will demonstrate improved fine motor coordination for ADLs as evidenced by decreasing 9 hole peg test score for Lt hand by 5 secs  Baseline: 30 sec Goal status: MET (02/16/24 = 25 sec)    ASSESSMENT:  CLINICAL IMPRESSION: Patient has met 4/5 STG's and all LTG's. Pt please with progress and reports overall improvements.   PERFORMANCE DEFICITS: in functional skills including ADLs, IADLs, coordination, dexterity, ROM, strength, pain, flexibility, Fine motor control, Gross motor control, mobility, balance, body mechanics, endurance, decreased knowledge of use of DME, and UE functional use, cognitive skills including memory.   IMPAIRMENTS: are limiting patient from ADLs, IADLs, leisure, and social participation.    COMORBIDITIES:  may have co-morbidities  that affects occupational performance. Patient will benefit from skilled OT to address above impairments and improve overall function.  MODIFICATION OR ASSISTANCE TO COMPLETE EVALUATION: No modification of tasks or assist necessary to complete an evaluation.  OT OCCUPATIONAL PROFILE AND HISTORY: Detailed assessment: Review of records and additional review of physical, cognitive, psychosocial history related to current functional performance.  CLINICAL DECISION MAKING: Moderate - several treatment options, min-mod task modification necessary  REHAB POTENTIAL: Good  EVALUATION COMPLEXITY: Moderate    PLAN:  OT FREQUENCY: 2x/week  OT DURATION: 8 weeks  PLANNED INTERVENTIONS: 97535 self care/ADL training, 02889 therapeutic exercise, 97530 therapeutic activity, 97112 neuromuscular re-education, 97140 manual therapy, 97010 moist heat, 97750 Physical Performance Testing, 02239 Orthotic Initial, [id number] Orthotic/Prosthetic subsequent, passive range of motion, functional mobility training, visual/perceptual remediation/compensation, patient/family education, and DME and/or AE instructions  RECOMMENDED OTHER SERVICES: none at this time (Pt has SLP evaluation scheduled)   CONSULTED AND AGREED WITH PLAN OF CARE: Patient  PLAN   D/C O.T.   PD screens in 6 months   Burnard JINNY Roads, OT 02/16/2024, 11:09 AM

## 2024-02-17 DIAGNOSIS — S61422A Laceration with foreign body of left hand, initial encounter: Secondary | ICD-10-CM | POA: Diagnosis not present

## 2024-02-22 DIAGNOSIS — R7301 Impaired fasting glucose: Secondary | ICD-10-CM | POA: Diagnosis not present

## 2024-02-22 DIAGNOSIS — E785 Hyperlipidemia, unspecified: Secondary | ICD-10-CM | POA: Diagnosis not present

## 2024-02-22 DIAGNOSIS — E7849 Other hyperlipidemia: Secondary | ICD-10-CM | POA: Diagnosis not present

## 2024-02-22 DIAGNOSIS — I10 Essential (primary) hypertension: Secondary | ICD-10-CM | POA: Diagnosis not present

## 2024-02-22 DIAGNOSIS — Z1389 Encounter for screening for other disorder: Secondary | ICD-10-CM | POA: Diagnosis not present

## 2024-02-22 DIAGNOSIS — Z125 Encounter for screening for malignant neoplasm of prostate: Secondary | ICD-10-CM | POA: Diagnosis not present

## 2024-02-22 DIAGNOSIS — E291 Testicular hypofunction: Secondary | ICD-10-CM | POA: Diagnosis not present

## 2024-02-27 DIAGNOSIS — D485 Neoplasm of uncertain behavior of skin: Secondary | ICD-10-CM | POA: Diagnosis not present

## 2024-02-27 DIAGNOSIS — D225 Melanocytic nevi of trunk: Secondary | ICD-10-CM | POA: Diagnosis not present

## 2024-02-27 DIAGNOSIS — D1801 Hemangioma of skin and subcutaneous tissue: Secondary | ICD-10-CM | POA: Diagnosis not present

## 2024-02-27 DIAGNOSIS — C4442 Squamous cell carcinoma of skin of scalp and neck: Secondary | ICD-10-CM | POA: Diagnosis not present

## 2024-02-27 DIAGNOSIS — C44712 Basal cell carcinoma of skin of right lower limb, including hip: Secondary | ICD-10-CM | POA: Diagnosis not present

## 2024-02-27 DIAGNOSIS — L570 Actinic keratosis: Secondary | ICD-10-CM | POA: Diagnosis not present

## 2024-02-27 DIAGNOSIS — Z85828 Personal history of other malignant neoplasm of skin: Secondary | ICD-10-CM | POA: Diagnosis not present

## 2024-02-27 DIAGNOSIS — L821 Other seborrheic keratosis: Secondary | ICD-10-CM | POA: Diagnosis not present

## 2024-02-27 DIAGNOSIS — D1721 Benign lipomatous neoplasm of skin and subcutaneous tissue of right arm: Secondary | ICD-10-CM | POA: Diagnosis not present

## 2024-02-27 DIAGNOSIS — D1722 Benign lipomatous neoplasm of skin and subcutaneous tissue of left arm: Secondary | ICD-10-CM | POA: Diagnosis not present

## 2024-02-27 DIAGNOSIS — C44629 Squamous cell carcinoma of skin of left upper limb, including shoulder: Secondary | ICD-10-CM | POA: Diagnosis not present

## 2024-02-29 ENCOUNTER — Ambulatory Visit: Admitting: Speech Pathology

## 2024-02-29 DIAGNOSIS — Z23 Encounter for immunization: Secondary | ICD-10-CM | POA: Diagnosis not present

## 2024-02-29 DIAGNOSIS — R82998 Other abnormal findings in urine: Secondary | ICD-10-CM | POA: Diagnosis not present

## 2024-03-07 ENCOUNTER — Encounter: Admitting: Speech Pathology

## 2024-03-13 DIAGNOSIS — H353121 Nonexudative age-related macular degeneration, left eye, early dry stage: Secondary | ICD-10-CM | POA: Diagnosis not present

## 2024-03-13 DIAGNOSIS — H353211 Exudative age-related macular degeneration, right eye, with active choroidal neovascularization: Secondary | ICD-10-CM | POA: Diagnosis not present

## 2024-03-13 DIAGNOSIS — H43823 Vitreomacular adhesion, bilateral: Secondary | ICD-10-CM | POA: Diagnosis not present

## 2024-03-13 DIAGNOSIS — H43813 Vitreous degeneration, bilateral: Secondary | ICD-10-CM | POA: Diagnosis not present

## 2024-03-13 DIAGNOSIS — H2513 Age-related nuclear cataract, bilateral: Secondary | ICD-10-CM | POA: Diagnosis not present

## 2024-03-13 NOTE — Progress Notes (Signed)
 Assessment/Plan:  Assessment and Plan Assessment & Plan Parkinson's disease -Carbidopa /levodopa  effective for rigidity and fall risk reduction. Left hand tremor persists. Discussed that 30-50% of patients may still experience tremor despite treatment. Emphasized not increasing medication solely for tremor to avoid side effects. - Continue carbidopa /levodopa  25/100 mg, one tablet three times daily. - Encouraged continuation of exercise regimen, including rock study boxing and stretching. - Instructed to monitor for symptoms of orthostatic hypotension, such as dizziness or lightheadedness. - Advised to increase water intake to at least 60 ounces daily. - Refilled carbidopa /levodopa  prescription at [facility] on [address] at the [address] of [address].  2. Essential hypertension Currently on antihypertensive medication. Discussed potential for Parkinson's disease to lower blood pressure over time, which may necessitate adjustments in antihypertensive therapy. - Continue current antihypertensive medication. - Monitor blood pressure regularly. - Advised to report any symptoms of dizziness or lightheadedness.   3.  History of basal and squamous cell carcinoma  - Follows with Dr. Joshua.  - Notes that Parkinsons increases risk for melanoma and should continue to follow with dermatology. Subjective:  Discussed the use of AI scribe software for clinical note transcription with the patient, who gave verbal consent to proceed.  History of Present Illness Ricardo Cooper is a 78 year old male with Parkinson's disease who presents for a follow-up visit.  Pt with wife who supplements hx.  He was diagnosed with Parkinson's disease at the last visit and started on carbidopa /levodopa  25/100 mg, one tablet three times a day at 8 AM, 12 PM, and 4 PM. He has not experienced any side effects and feels it is helping with his movement, noting he feels 'a little bit looser.' No falls have occurred.  He  engages in physical activity, participating in [employer] twice a week and a stretching class on Fridays. He has completed physical and occupational therapy since the last visit and plans to start speech therapy in December.  No episodes of syncope, hallucinations, or significant mood changes, although he notes his mood has been 'better' and wife agrees with that  He has a history of basal and squamous cell skin cancer and follows up with dermatological care every six months. No new medical problems have been reported, and he continues to take medication for cholesterol and blood pressure.  His blood pressure has been lower, with a recent diastolic reading of 60 mmHg.  His left hand tremor is more noticeable when sitting still and sometimes worsens with nervousness.    Current prescribed movement disorder medications: Carbidopa /levodopa  25/100, 1 tablet 3 times per day (started last visit)   PREVIOUS MEDICATIONS: Sinemet   ALLERGIES:   Allergies  Allergen Reactions   Ibuprofen Other (See Comments)    REACTION: causes colitis flare Pt was told not to take Ibuprofen d/t reaction   Ozempic  (0.25 Or 0.5 Mg-Dose) [Semaglutide (0.25 Or 0.5mg -Dos)]     nausea    CURRENT MEDICATIONS:  Current Meds  Medication Sig   albuterol  (VENTOLIN  HFA) 108 (90 Base) MCG/ACT inhaler Inhale 1-2 puffs into the lungs every 6 (six) hours as needed.   carbidopa -levodopa  (SINEMET  IR) 25-100 MG tablet Take 1 tablet by mouth 3 (three) times daily. 8am/noon/4pm   ezetimibe (ZETIA) 10 MG tablet Take 10 mg by mouth daily.   fish oil-omega-3 fatty acids 1000 MG capsule Take 2 g by mouth daily.   Fluticasone -Umeclidin-Vilant (TRELEGY ELLIPTA ) 100-62.5-25 MCG/ACT AEPB TAKE 1 PUFF BY MOUTH EVERY DAY   folic acid  (FOLVITE ) 1 MG tablet  Take 1 tablet (1 mg total) by mouth daily.   losartan -hydrochlorothiazide  (HYZAAR) 50-12.5 MG tablet TAKE 1 TABLET BY MOUTH EVERY DAY   mercaptopurine  (PURINETHOL ) 50 MG tablet  Take 2 tablets (100 mg total) by mouth daily. Give on an empty stomach 1 hour before or 2 hours after meals. Caution: Chemotherapy.   Multiple Vitamins-Minerals (PRESERVISION AREDS 2) CAPS take 1 po BID Oral   pravastatin (PRAVACHOL) 40 MG tablet Take 40 mg by mouth daily.   silodosin (RAPAFLO) 8 MG CAPS capsule Take 8 mg by mouth daily with breakfast. (Patient taking differently: Take 8 mg by mouth at bedtime.)   sulfaSALAzine  (AZULFIDINE ) 500 MG tablet Take 2 tablets (1,000 mg total) by mouth 3 (three) times daily.   tadalafil (CIALIS) 5 MG tablet Take 5 mg by mouth at bedtime.   TIADYLT  ER 180 MG 24 hr capsule TAKE 1 CAPSULE BY MOUTH EVERY DAY   tobramycin (TOBREX) 0.3 % ophthalmic solution Place 1 drop into both eyes in the morning, at noon, in the evening, and at bedtime. Once a month     Objective:   PHYSICAL EXAMINATION:    VITALS:   Vitals:   03/15/24 1037  BP: 130/60  Pulse: 68  SpO2: 94%  Weight: 234 lb 9.6 oz (106.4 kg)  Height: 5' 11 (1.803 m)    GEN:  The patient appears stated age and is in NAD. HEENT:  Normocephalic, atraumatic.  The mucous membranes are moist. The superficial temporal arteries are without ropiness or tenderness. CV:  RRR Lungs:  CTAB Neck/HEME:  There are no carotid bruits bilaterally.  Neurological examination:  Orientation: The patient is alert and oriented x3. Cranial nerves: There is good facial symmetry with mild facial hypomimia. The speech is fluent and clear. Soft palate rises symmetrically and there is no tongue deviation. Hearing is intact to conversational tone. Sensation: Sensation is intact to light touch throughout Motor: Strength is at least antigravity x4.  Movement examination: Tone: There is nl tone in the UE/LE Abnormal movements: there is LUE rest tremor, mild and intermittent, worse with ambulation Coordination:  There is min decremation with RAM's, only with finger taps on the L.  All other RAMs are normal  bilaterally Gait and Station: The patient has no difficulty arising out of a deep-seated chair without the use of the hands. The patient's stride length is good with LUE rest tremor.    I have reviewed and interpreted the following labs independently    Chemistry      Component Value Date/Time   NA 141 08/17/2023 1528   K 4.0 Hemolysis seen.SABRA 08/17/2023 1528   CL 103 08/17/2023 1528   CO2 31 08/17/2023 1528   BUN 16 08/17/2023 1528   CREATININE 0.93 08/17/2023 1528      Component Value Date/Time   CALCIUM 9.3 08/17/2023 1528   ALKPHOS 46 08/17/2023 1528   AST 27 08/17/2023 1528   ALT 43 08/17/2023 1528   BILITOT 0.7 08/17/2023 1528       Lab Results  Component Value Date   WBC 3.9 (L) 08/17/2023   HGB 15.8 08/17/2023   HCT 47.5 08/17/2023   MCV 104.2 (H) 08/17/2023   PLT 164.0 08/17/2023    No results found for: TSH    Cc:  [name]., MD

## 2024-03-14 ENCOUNTER — Encounter: Admitting: Speech Pathology

## 2024-03-14 ENCOUNTER — Other Ambulatory Visit (HOSPITAL_COMMUNITY): Payer: Self-pay | Admitting: Physician Assistant

## 2024-03-15 ENCOUNTER — Ambulatory Visit: Admitting: Neurology

## 2024-03-15 ENCOUNTER — Encounter: Payer: Self-pay | Admitting: Neurology

## 2024-03-15 VITALS — BP 130/60 | HR 68 | Ht 71.0 in | Wt 234.6 lb

## 2024-03-15 DIAGNOSIS — I10 Essential (primary) hypertension: Secondary | ICD-10-CM | POA: Diagnosis not present

## 2024-03-15 DIAGNOSIS — G20A1 Parkinson's disease without dyskinesia, without mention of fluctuations: Secondary | ICD-10-CM | POA: Diagnosis not present

## 2024-03-15 MED ORDER — CARBIDOPA-LEVODOPA 25-100 MG PO TABS: 1.0000 | ORAL_TABLET | Freq: Three times a day (TID) | ORAL | 1 refills | Status: AC

## 2024-03-15 NOTE — Patient Instructions (Signed)
  VISIT SUMMARY: You had a follow-up visit to discuss your Parkinson's disease and overall health. Your current medication for Parkinson's disease, carbidopa /levodopa , is helping with your movement, and you have not experienced any side effects. You are continuing your physical activities and plan to start speech therapy soon. Your blood pressure has been lower, and you are monitoring it regularly. No new medical problems were reported.  YOUR PLAN: -PARKINSON'S DISEASE: Parkinson's disease is a disorder of the nervous system that affects movement. Your current medication, carbidopa /levodopa , is effective for reducing rigidity and fall risk, although your left hand tremor persists. You should continue taking carbidopa /levodopa  25/100 mg, one tablet three times daily. Keep up with your exercise regimen, including [employer] and stretching. Monitor for symptoms of dizziness or lightheadedness, and increase your water intake to at least 60 ounces daily. Your prescription has been refilled at [facility] on [address] at the [address] of [address].  -ESSENTIAL HYPERTENSION: Essential hypertension is high blood pressure with no identifiable cause. You are currently on medication for this condition. Parkinson's disease can lower blood pressure over time, so you may need to adjust your antihypertensive therapy. Continue your current medication, monitor your blood pressure regularly, and report any symptoms of dizziness or lightheadedness.  INSTRUCTIONS: Please continue to monitor your blood pressure regularly and report any symptoms of dizziness or lightheadedness. Make sure to increase your water intake to at least 60 ounces daily. Your carbidopa /levodopa  prescription has been refilled at [facility] on [address] at the [address].                      Contains text generated by [employer].                                 Contains text generated by [employer].

## 2024-03-21 ENCOUNTER — Other Ambulatory Visit: Payer: Self-pay

## 2024-03-21 ENCOUNTER — Encounter: Payer: Self-pay | Admitting: Speech Pathology

## 2024-03-21 ENCOUNTER — Ambulatory Visit: Attending: Neurology | Admitting: Speech Pathology

## 2024-03-21 DIAGNOSIS — R471 Dysarthria and anarthria: Secondary | ICD-10-CM | POA: Insufficient documentation

## 2024-03-21 DIAGNOSIS — G20A1 Parkinson's disease without dyskinesia, without mention of fluctuations: Secondary | ICD-10-CM | POA: Diagnosis not present

## 2024-03-21 DIAGNOSIS — R498 Other voice and resonance disorders: Secondary | ICD-10-CM | POA: Diagnosis not present

## 2024-03-21 NOTE — Therapy (Signed)
 OUTPATIENT SPEECH LANGUAGE PATHOLOGY PARKINSON'S EVALUATION   Patient Name: Ricardo Cooper MRN: [id number] DOB:1946-03-16, 78 y.o., male Today's Date: 03/21/2024  PCP: Loreli Elsie BIRCH, MD REFERRING PROVIDER: Evonnie Asberry RAMAN, DO  END OF SESSION:  End of Session - 03/21/24 1221     Visit Number 1    Number of Visits 9    Date for Recertification  05/16/24    SLP Start Time 1015    SLP Stop Time  1100    SLP Time Calculation (min) 45 min    Activity Tolerance Patient tolerated treatment well          Past Medical History:  Diagnosis Date   Allergic rhinitis    Allergy    Cataract    beginnings of cataracts    CHF (congestive heart failure) (HCC)    COPD (chronic obstructive pulmonary disease) (HCC)    Diverticulosis    GERD (gastroesophageal reflux disease)    no issues with CPAP-1999   HTN (hypertension)    Hyperlipidemia    Obesity    Obstructive sleep apnea on CPAP    Paroxysmal atrial fibrillation (HCC) 2005   past trauma and infection   Skin cancer    basel cell on nose,arm,leg   Sleep apnea    with CPAP   Ulcerative colitis    Ulcerative colitis, left sided (HCC)    Past Surgical History:  Procedure Laterality Date   BACK SURGERY  2005   BELPHAROPTOSIS REPAIR     bilateral   COLONOSCOPY  2013   Dr. Albertus    FETAL SURGERY FOR CONGENITAL HERNIA  2001   infection in chest that had to be cleaned out     Staph Infection   POLYPECTOMY     SKIN CANCER EXCISION     squamous and basal cell   TONSILLECTOMY     tubular adenoma  2013   UMBILICAL HERNIA REPAIR     VASECTOMY     Patient Active Problem List   Diagnosis Date Noted   History of colonic polyps 04/28/2022   Obesity (BMI 30-39.9) 07/02/2021   CAP (community acquired pneumonia) 03/18/2021   Plantar fasciitis 01/18/2020   Pain due to onychomycosis of toenails of both feet 09/18/2019   Multiple lung nodules 06/07/2018   Influenza with respiratory manifestation 06/06/2015   Ulcerative  colitis with complication (HCC) 05/03/2011   PURE HYPERCHOLESTEROLEMIA 03/23/2010   PRECORDIAL PAIN 03/23/2010   CHEST TIGHTNESS-PRESSURE-OTHER 03/18/2010   DYSPNEA 11/19/2008   HYPERLIPIDEMIA 02/01/2008   HYPERTENSION 02/01/2008   ATRIAL FIBRILLATION 02/01/2008   Allergic rhinitis 02/01/2008   COPD GOLD II 02/01/2008   Obstructive sleep apnea 02/01/2008    ONSET DATE: 12/06/2023 (referral date)  REFERRING DIAG: G20.A1 (ICD-10-CM) - Parkinson's disease without dyskinesia or fluctuating manifestations (HCC) R49.8 (ICD-10-CM) - Hypophonia  THERAPY DIAG:  Dysarthria and anarthria  Rationale for Evaluation and Treatment: Rehabilitation  SUBJECTIVE:   SUBJECTIVE STATEMENT: I've always been soft spoken Pt accompanied by: self  PERTINENT HISTORY: PMH: Parkinsonism (recently diagnosed 10/2023), CHF, COPD, HTN, HLD, hx of skin cancer, hx of back injury in 2005  Per Dr. Evonnie: left hand rest tremor for 3 months. Notes from primary care indicate that patient has had bradykinesia with shuffling gait since 2023, now with more recent tremor.   PAIN:  Are you having pain? No  FALLS: Has patient fallen in last 6 months?  See PT evaluation for details  LIVING ENVIRONMENT: Lives with: lives with their spouse Lives in: House/apartment  PLOF:  Level of assistance: Independent with ADLs, Independent with IADLs Employment: Retired  PATIENT GOALS: To be understood  OBJECTIVE:  Note: Objective measures were completed at Evaluation unless otherwise noted.   COGNITION: Overall cognitive status: Within functional limits for tasks assessed Areas of impairment:  Comments: mildly slow processing  MOTOR SPEECH: Overall motor speech: impaired Level of impairment: Word Respiration: thoracic breathing Phonation: hoarse and low vocal intensity Resonance: WFL Articulation: Appears intact Intelligibility: Intelligibility reduced Motor planning: Appears intact Motor speech errors:  unaware Interfering components: COPD Effective technique: increased vocal intensity  ORAL MOTOR EXAMINATION: Overall status: WFL Comments:    OBJECTIVE VOICE ASSESSMENT: Sustained ah maximum phonation time: 8.43 seconds Sustained ah loudness average: 66 dB Oral reading (passage) loudness average: 66 dB Oral reading loudness range: 62-67 dB Conversational loudness average: 65 dB Conversational loudness range: 62-67 dB Voice quality: hoarse and low vocal intensity Stimulability trials: Given SLP modeling and usual mod cues, loudness average increased to 78dB (range of 76 to 80)at loud ah, level. And increased to 82dB average with reading short sentences Comments: hoarseness improved by 50% subjectively during sentence reading with increased vocal intensity  Completed audio recording of patients baseline voice without cueing from SLP: Yes  Pt does not report difficulty with swallowing which does not warrant further evaluation.  PATIENT REPORTED OUTCOME MEASURES (PROM): Communication Effectiveness Survey: 29 He rated a 1 or not at all effective for having a conversation at a distance; rated a 2 for conversing at home, in a noisy gathering and in the car. Rated a 3 for conversing with strangers and over the phone                                                                                                                          TREATMENT DATE:   03/21/24: Evaluation completed. Initiated training in SPEAK OUT! HEP - Workbook Lesson one - with usual mod modeling and instruction, [name] averaged 77 on loud Ah, 83dB on glides, 88 on counting, indicating improved understanding of speaking with intent as Lesson progressed. Reviewed workbook and demonstrated navigation of Parkinson Voice Project website to locate the learn about Parkinson's video they are required to watch and an on line home practice session. Requested booklet and E Library access from website.    PATIENT  EDUCATION: Education details: HEP for dysarthria, compensations for dysarthria, general swallowing precautions Person educated: Patient Education method: Explanation, Demonstration, Verbal cues, and Handouts Education comprehension: needs further education  HOME EXERCISE PROGRAM: SPEAK OUT!    GOALS: Goals reviewed with patient? Yes  SHORT TERM GOALS: Target date: 04/04/24  Pt will complete HEP for dysarthria with occasional min A Baseline: Goal status: INITIAL  2.  Pt will complete 2 on line home practice sessions or E Library Lessons as part of HEP Baseline:  Goal status: INITIAL  3.  Pt will average 72dB 18/20 sentences Baseline:  Goal status: INITIAL  4.  Pt and family will watch the Learn  About Parkinson's video on the Parkinson Voice Project website Baseline:  Goal status: INITIAL  5.  Pt will average 68dB and clear phonation over 5 minute conversation with occasional mion A Baseline:  Goal status: INITIAL  6  LONG TERM GOALS: Target date: 05/16/24  Pt will complete HEP with rare min A Baseline:  Goal status: INITIAL  2.  Pt will complete HEP 5/7 days with rare min A Baseline:  Goal status: INITIAL  3.  Pt will complete 2 E Library lessons and 2 on line home practice sessions for HEP Baseline:  Goal status: INITIAL  4.  Pt will average 70dB over 12  minute conversation with clear phonation and occasional min A Baseline:  Goal status: INITIAL  5.  Pt will verbalize specific plan for continuing HEP upon d/c Baseline:  Goal status: INITIAL  6.  Pt will improve score on PROM - Communicative Effectiveness Survey Baseline:  Goal status: INITIAL  ASSESSMENT:  CLINICAL IMPRESSION: Patient is a 78 y.o. male who was seen today for moderate hypokinetic dysarthria. Volume consistently low and hoarse. He reports hoarseness is due to COPD inhaler, so we will keep this in mind. He reports he is unable to be heard across a room and that his family frequently  ask tell him Say it again due to reduced intelligibility. I recommend skilled ST to maximize intelligibility for safety, independence, and to reduce caregiver burden and improve social participation. 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 Degrees from [employer].   OBJECTIVE IMPAIRMENTS: Objective impairments include dysarthria. These impairments are limiting patient from effectively communicating at home and in community.Factors affecting potential to achieve goals and functional outcome are medical prognosis.. Patient will benefit from skilled SLP services to address above impairments and improve overall function.  REHAB POTENTIAL: Good  PLAN:  SLP FREQUENCY: 1-2x/week  SLP DURATION: 8 weeks  PLANNED INTERVENTIONS: Aspiration precaution training, Diet toleration management , Environmental controls, Cueing hierachy, Cognitive reorganization, Internal/external aids, Functional tasks, Multimodal communication approach, SLP instruction and feedback, Compensatory strategies, Patient/family education, [phone] Treatment of speech (30 or 45 min) , 92522- Speech Eval Sound Prod, Articulate, Phonological, and MBSS if indicated    Marcel Gary, Leita Caldron, CCC-SLP 03/21/2024, 12:30 PM

## 2024-03-21 NOTE — Patient Instructions (Signed)
   Parkinson's Voice Project website   Under Learn About Parkinson - watch What is Parkinson's Video - you and your family are to watch this video  Under Our Program - watch a pre-recorded Home Practice session  Check email - make an account for PVP to access the E Library  Throat clearing is abusive to your vocal folds and can worsen hoarseness - instead do a hard swallow or take a sip  You can bring water to speech therapy  Family is welcome (and should) attend speech therapy as they are able

## 2024-03-26 ENCOUNTER — Encounter: Payer: Self-pay | Admitting: Speech Pathology

## 2024-03-26 ENCOUNTER — Ambulatory Visit: Attending: Neurology | Admitting: Speech Pathology

## 2024-03-26 DIAGNOSIS — R471 Dysarthria and anarthria: Secondary | ICD-10-CM | POA: Diagnosis present

## 2024-03-26 NOTE — Patient Instructions (Addendum)
   Ricardo Cooper - [id number]  [birth year]  [address] [place], [address]  [phone]  I go by [name] have a [name] with chips  Sweet tea with lemon please  I'll have a tuna on wheat with chips please  [facility] please  I'll have the shrimp and grits please  I'll take Yueingling on draft please  I'll have the number one with fries and an Eveline Pan  I would like mayonnaise and hot sauce

## 2024-03-26 NOTE — Therapy (Signed)
 OUTPATIENT SPEECH LANGUAGE PATHOLOGY PARKINSON'S TREATMENT   Patient Name: Ricardo Cooper MRN: [id number] DOB:[birth year], 78 y.o., male Today's Date: 03/26/2024  PCP: Loreli Elsie BIRCH, MD REFERRING PROVIDER: Evonnie Asberry RAMAN, DO  END OF SESSION:  End of Session - 03/26/24 1453     Visit Number 2    Number of Visits 9    Date for Recertification  05/16/24    SLP Start Time 1400    SLP Stop Time  1445    SLP Time Calculation (min) 45 min    Activity Tolerance Patient tolerated treatment well           Past Medical History:  Diagnosis Date   Allergic rhinitis    Allergy    Cataract    beginnings of cataracts    CHF (congestive heart failure) (HCC)    COPD (chronic obstructive pulmonary disease) (HCC)    Diverticulosis    GERD (gastroesophageal reflux disease)    no issues with CPAP-1999   HTN (hypertension)    Hyperlipidemia    Obesity    Obstructive sleep apnea on CPAP    Paroxysmal atrial fibrillation (HCC) 2005   past trauma and infection   Skin cancer    basel cell on nose,arm,leg   Sleep apnea    with CPAP   Ulcerative colitis    Ulcerative colitis, left sided (HCC)    Past Surgical History:  Procedure Laterality Date   BACK SURGERY  2005   BELPHAROPTOSIS REPAIR     bilateral   COLONOSCOPY  2013   Dr. Albertus    FETAL SURGERY FOR CONGENITAL HERNIA  2001   infection in chest that had to be cleaned out     Staph Infection   POLYPECTOMY     SKIN CANCER EXCISION     squamous and basal cell   TONSILLECTOMY     tubular adenoma  2013   UMBILICAL HERNIA REPAIR     VASECTOMY     Patient Active Problem List   Diagnosis Date Noted   History of colonic polyps 04/28/2022   Obesity (BMI 30-39.9) 07/02/2021   CAP (community acquired pneumonia) 03/18/2021   Plantar fasciitis 01/18/2020   Pain due to onychomycosis of toenails of both feet 09/18/2019   Multiple lung nodules 06/07/2018   Influenza with respiratory manifestation 06/06/2015   Ulcerative  colitis with complication (HCC) 05/03/2011   PURE HYPERCHOLESTEROLEMIA 03/23/2010   PRECORDIAL PAIN 03/23/2010   CHEST TIGHTNESS-PRESSURE-OTHER 03/18/2010   DYSPNEA 11/19/2008   HYPERLIPIDEMIA 02/01/2008   HYPERTENSION 02/01/2008   ATRIAL FIBRILLATION 02/01/2008   Allergic rhinitis 02/01/2008   COPD GOLD II 02/01/2008   Obstructive sleep apnea 02/01/2008    ONSET DATE: 12/06/2023 (referral date)  REFERRING DIAG: G20.A1 (ICD-10-CM) - Parkinson's disease without dyskinesia or fluctuating manifestations (HCC) R49.8 (ICD-10-CM) - Hypophonia  THERAPY DIAG:  Dysarthria and anarthria  Rationale for Evaluation and Treatment: Rehabilitation  SUBJECTIVE:   SUBJECTIVE STATEMENT: We had to reset our password to log in re: Parkinson Voice Project website Pt accompanied by: self and significant other Ricardo Cooper  PERTINENT HISTORY: PMH: Parkinsonism (recently diagnosed 10/2023), CHF, COPD, HTN, HLD, hx of skin cancer, hx of back injury in 2005  Per Dr. Evonnie: left hand rest tremor for 3 months. Notes from primary care indicate that patient has had bradykinesia with shuffling gait since 2023, now with more recent tremor.   PAIN:  Are you having pain? No   PATIENT REPORTED OUTCOME MEASURES (PROM): Communication Effectiveness Survey: 29 He rated  a 1 or not at all effective for having a conversation at a distance; rated a 2 for conversing at home, in a noisy gathering and in the car. Rated a 3 for conversing with strangers and over the phone                                                                                                                          TREATMENT DATE:   03/26/24: Wyn and Sharman have watched the Learn about Parkinson's video and accessed home practice reading material. They have not accessed E Library or completed an online home practice session. I assigned this for [facility] today.  Targeted volume and intelligibility using Speak Out! Lesson December Week 1 in the Year of Intent  workbook (E [employer]). Pt required usual min verbal cues, modeling initially, for intelligibility. After initial modeling and chorale practice, cues were faded to occasional min A Pt averages the following volume levels:  Sustained AH: 83dB  Counting:80dB  Reading (phrases): 78dB  Cognitive Exercise: 74dB Required usual mod verbal cues, modeling for carryover of intent /volume answering simple questions following structured practice.    03/21/24: Evaluation completed. Initiated training in SPEAK OUT! HEP - Workbook Lesson one - with usual mod modeling and instruction, [name] averaged 77 on loud Ah, 83dB on glides, 88 on counting, indicating improved understanding of speaking with intent as Lesson progressed. Reviewed workbook and demonstrated navigation of Parkinson Voice Project website to locate the learn about Parkinson's video they are required to watch and an on line home practice session. Requested booklet and E Library access from website.    PATIENT EDUCATION: Education details: HEP for dysarthria, compensations for dysarthria, general swallowing precautions Person educated: Patient Education method: Explanation, Demonstration, Verbal cues, and Handouts Education comprehension: needs further education  HOME EXERCISE PROGRAM: SPEAK OUT!    GOALS: Goals reviewed with patient? Yes  SHORT TERM GOALS: Target date: 04/04/24  Pt will complete HEP for dysarthria with occasional min A Baseline: Goal status: ONGOING  2.  Pt will complete 2 on line home practice sessions or E Library Lessons as part of HEP Baseline:  Goal status: ONGOING  3.  Pt will average 72dB 18/20 sentences Baseline:  Goal status: ONGOING  4.  Pt and family will watch the Learn About Parkinson's video on the Parkinson Voice Project website Baseline:  Goal status: ONGOING  5.  Pt will average 68dB and clear phonation over 5 minute conversation with occasional mion A Baseline:  Goal status:  ONGOING  6  LONG TERM GOALS: Target date: 05/16/24  Pt will complete HEP with rare min A Baseline:  Goal status: ONGOING  2.  Pt will complete HEP 5/7 days with rare min A Baseline:  Goal status: ONGOING  3.  Pt will complete 2 E Library lessons and 2 on line home practice sessions for HEP Baseline:  Goal status: ONGOING  4.  Pt will average 70dB over 12  minute conversation with clear phonation  and occasional min A Baseline:  Goal status: ONGOING  5.  Pt will verbalize specific plan for continuing HEP upon d/c Baseline:  Goal status: ONGOING  6.  Pt will improve score on PROM - Communicative Effectiveness Survey Baseline:  Goal status: ONGOING  ASSESSMENT:  CLINICAL IMPRESSION: Patient is a 78 y.o. male who was seen today for moderate hypokinetic dysarthria. Volume consistently low and hoarse. He reports hoarseness is due to COPD inhaler, so we will keep this in mind. He reports he is unable to be heard across a room and that his family frequently ask tell him Say it again due to reduced intelligibility. I recommend skilled ST to maximize intelligibility for safety, independence, and to reduce caregiver burden and improve social participation. 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 Degrees from [employer].   OBJECTIVE IMPAIRMENTS: Objective impairments include dysarthria. These impairments are limiting patient from effectively communicating at home and in community.Factors affecting potential to achieve goals and functional outcome are medical prognosis.. Patient will benefit from skilled SLP services to address above impairments and improve overall function.  REHAB POTENTIAL: Good  PLAN:  SLP FREQUENCY: 1-2x/week  SLP DURATION: 8 weeks  PLANNED INTERVENTIONS: Aspiration precaution training, Diet toleration management , Environmental controls, Cueing  hierachy, Cognitive reorganization, Internal/external aids, Functional tasks, Multimodal communication approach, SLP instruction and feedback, Compensatory strategies, Patient/family education, [phone] Treatment of speech (30 or 45 min) , 92522- Speech Eval Sound Prod, Articulate, Phonological, and MBSS if indicated    Zaidyn Claire, Leita Caldron, CCC-SLP 03/26/2024, 3:02 PM

## 2024-03-28 ENCOUNTER — Ambulatory Visit

## 2024-03-28 DIAGNOSIS — R471 Dysarthria and anarthria: Secondary | ICD-10-CM

## 2024-03-28 NOTE — Therapy (Signed)
 OUTPATIENT SPEECH LANGUAGE PATHOLOGY PARKINSON'S TREATMENT   Patient Name: Ricardo Cooper MRN: [id number] DOB:1945/05/06, 78 y.o., male Today's Date: 03/28/2024  PCP: Loreli Elsie BIRCH, MD REFERRING PROVIDER: Evonnie Asberry RAMAN, DO  END OF SESSION:  End of Session - 03/28/24 1448     Visit Number 3    Number of Visits 9    Date for Recertification  05/16/24    SLP Start Time 1402    SLP Stop Time  1448    SLP Time Calculation (min) 46 min    Activity Tolerance Patient tolerated treatment well            Past Medical History:  Diagnosis Date   Allergic rhinitis    Allergy    Cataract    beginnings of cataracts    CHF (congestive heart failure) (HCC)    COPD (chronic obstructive pulmonary disease) (HCC)    Diverticulosis    GERD (gastroesophageal reflux disease)    no issues with CPAP-1999   HTN (hypertension)    Hyperlipidemia    Obesity    Obstructive sleep apnea on CPAP    Paroxysmal atrial fibrillation (HCC) 2005   past trauma and infection   Skin cancer    basel cell on nose,arm,leg   Sleep apnea    with CPAP   Ulcerative colitis    Ulcerative colitis, left sided (HCC)    Past Surgical History:  Procedure Laterality Date   BACK SURGERY  2005   BELPHAROPTOSIS REPAIR     bilateral   COLONOSCOPY  2013   Dr. Albertus    FETAL SURGERY FOR CONGENITAL HERNIA  2001   infection in chest that had to be cleaned out     Staph Infection   POLYPECTOMY     SKIN CANCER EXCISION     squamous and basal cell   TONSILLECTOMY     tubular adenoma  2013   UMBILICAL HERNIA REPAIR     VASECTOMY     Patient Active Problem List   Diagnosis Date Noted   History of colonic polyps 04/28/2022   Obesity (BMI 30-39.9) 07/02/2021   CAP (community acquired pneumonia) 03/18/2021   Plantar fasciitis 01/18/2020   Pain due to onychomycosis of toenails of both feet 09/18/2019   Multiple lung nodules 06/07/2018   Influenza with respiratory manifestation 06/06/2015   Ulcerative  colitis with complication (HCC) 05/03/2011   PURE HYPERCHOLESTEROLEMIA 03/23/2010   PRECORDIAL PAIN 03/23/2010   CHEST TIGHTNESS-PRESSURE-OTHER 03/18/2010   DYSPNEA 11/19/2008   HYPERLIPIDEMIA 02/01/2008   HYPERTENSION 02/01/2008   ATRIAL FIBRILLATION 02/01/2008   Allergic rhinitis 02/01/2008   COPD GOLD II 02/01/2008   Obstructive sleep apnea 02/01/2008    ONSET DATE: 12/06/2023 (referral date)  REFERRING DIAG: G20.A1 (ICD-10-CM) - Parkinson's disease without dyskinesia or fluctuating manifestations (HCC) R49.8 (ICD-10-CM) - Hypophonia  THERAPY DIAG:  Dysarthria and anarthria  Rationale for Evaluation and Treatment: Rehabilitation  SUBJECTIVE:   SUBJECTIVE STATEMENT: I get a lot of phlegm. Pt accompanied by: self and significant other [name]  PERTINENT HISTORY: PMH: Parkinsonism (recently diagnosed 10/2023), CHF, COPD, HTN, HLD, hx of skin cancer, hx of back injury in 2005  Per Dr. Evonnie: left hand rest tremor for 3 months. Notes from primary care indicate that patient has had bradykinesia with shuffling gait since 2023, now with more recent tremor.   PAIN:  Are you having pain? No   PATIENT REPORTED OUTCOME MEASURES (PROM): Communication Effectiveness Survey: 29 He rated a 1 or not at all effective  for having a conversation at a distance; rated a 2 for conversing at home, in a noisy gathering and in the car. Rated a 3 for conversing with strangers and over the phone                                                                                                                          TREATMENT DATE:   03/28/24: Pt notes that he completed his online HEP yesterday with no issues. Pt states that speaking with intent still feels unusual; however, he feels that his voice is slightly stronger, especially after practicing at home.  Targeted volume and intelligibility using Speak Out! Lesson 2 and 3. Pt required frequent min verbal cues, modeling, for volume and breath support.    Pt averages the following volume levels:  Sustained AH: 84 dB   Counting: 87.5 dB  Reading (phrases): 86.5 dB  Cognitive Exercise: 80 dB Required occasional min verbal cues, modeling for carryover of intent /volume answering simple questions following structured practice. SLP provided rare min A for slightly decreasing pt's volume during conversation/cognitive task. Pt was advised to utilize intent regardless of volume level during speech production. Pt is making progress with speech goals and is compliant with HEP. Plan is to continue speech exercises and asking pt about HEP compliance to optimize outcomes.    03/26/24: Wyn and Sharman have watched the Learn about Parkinson's video and accessed home practice reading material. They have not accessed E Library or completed an online home practice session. I assigned this for [facility] today.  Targeted volume and intelligibility using Speak Out! Lesson December Week 1 in the Year of Intent workbook (E [employer]). Pt required usual min verbal cues, modeling initially, for intelligibility. After initial modeling and chorale practice, cues were faded to occasional min A Pt averages the following volume levels:  Sustained AH: 83dB  Counting:80dB  Reading (phrases): 78dB  Cognitive Exercise: 74dB Required usual mod verbal cues, modeling for carryover of intent /volume answering simple questions following structured practice.    03/21/24: Evaluation completed. Initiated training in SPEAK OUT! HEP - Workbook Lesson one - with usual mod modeling and instruction, [name] averaged 77 on loud Ah, 83dB on glides, 88 on counting, indicating improved understanding of speaking with intent as Lesson progressed. Reviewed workbook and demonstrated navigation of Parkinson Voice Project website to locate the learn about Parkinson's video they are required to watch and an on line home practice session. Requested booklet and E Library access from website.    PATIENT  EDUCATION: Education details: HEP for dysarthria, compensations for dysarthria, general swallowing precautions Person educated: Patient Education method: Explanation, Demonstration, Verbal cues, and Handouts Education comprehension: needs further education  HOME EXERCISE PROGRAM: SPEAK OUT!    GOALS: Goals reviewed with patient? Yes  SHORT TERM GOALS: Target date: 04/04/24  Pt will complete HEP for dysarthria with occasional min A Baseline: Goal status: ONGOING  2.  Pt will complete 2 on line home practice sessions  or E [employer] as part of HEP Baseline:  Goal status: ONGOING  3.  Pt will average 72dB 18/20 sentences Baseline:  Goal status: ONGOING  4.  Pt and family will watch the Learn About Parkinson's video on the Parkinson Voice Project website Baseline:  Goal status: ONGOING  5.  Pt will average 68dB and clear phonation over 5 minute conversation with occasional mion A Baseline:  Goal status: ONGOING  6  LONG TERM GOALS: Target date: 05/16/24  Pt will complete HEP with rare min A Baseline:  Goal status: ONGOING  2.  Pt will complete HEP 5/7 days with rare min A Baseline:  Goal status: ONGOING  3.  Pt will complete 2 E Library lessons and 2 on line home practice sessions for HEP Baseline:  Goal status: ONGOING  4.  Pt will average 70dB over 12  minute conversation with clear phonation and occasional min A Baseline:  Goal status: ONGOING  5.  Pt will verbalize specific plan for continuing HEP upon d/c Baseline:  Goal status: ONGOING  6.  Pt will improve score on PROM - Communicative Effectiveness Survey Baseline:  Goal status: ONGOING  ASSESSMENT:  CLINICAL IMPRESSION: Patient is a 78 y.o. male who was seen today for moderate hypokinetic dysarthria. Volume consistently low and hoarse. He reports hoarseness is due to COPD inhaler, so we will keep this in mind. He reports he is unable to be heard across a room and that his family frequently  ask tell him Say it again due to reduced intelligibility. I recommend skilled ST to maximize intelligibility for safety, independence, and to reduce caregiver burden and improve social participation. 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 Degrees from [employer].   OBJECTIVE IMPAIRMENTS: Objective impairments include dysarthria. These impairments are limiting patient from effectively communicating at home and in community.Factors affecting potential to achieve goals and functional outcome are medical prognosis.. Patient will benefit from skilled SLP services to address above impairments and improve overall function.  REHAB POTENTIAL: Good  PLAN:  SLP FREQUENCY: 1-2x/week  SLP DURATION: 8 weeks  PLANNED INTERVENTIONS: Aspiration precaution training, Diet toleration management , Environmental controls, Cueing hierachy, Cognitive reorganization, Internal/external aids, Functional tasks, Multimodal communication approach, SLP instruction and feedback, Compensatory strategies, Patient/family education, [phone] Treatment of speech (30 or 45 min) , 92522- Speech Eval Sound Prod, Articulate, Phonological, and MBSS if indicated    Waddell Music, CF-SLP 03/28/2024, 4:42 PM

## 2024-04-02 ENCOUNTER — Ambulatory Visit: Admitting: Speech Pathology

## 2024-04-02 ENCOUNTER — Encounter: Payer: Self-pay | Admitting: Speech Pathology

## 2024-04-02 DIAGNOSIS — R471 Dysarthria and anarthria: Secondary | ICD-10-CM | POA: Diagnosis not present

## 2024-04-02 NOTE — Therapy (Signed)
 OUTPATIENT SPEECH LANGUAGE PATHOLOGY PARKINSON'S TREATMENT   Patient Name: Ricardo Cooper MRN: [id number] DOB:10/25/1945, 78 y.o., male Today's Date: 04/02/2024  PCP: Loreli Elsie BIRCH, MD REFERRING PROVIDER: Evonnie Asberry RAMAN, DO  END OF SESSION:  End of Session - 04/02/24 1405     Visit Number 4    Number of Visits 9    Date for Recertification  05/16/24    SLP Start Time 1403    SLP Stop Time  1445    SLP Time Calculation (min) 42 min    Activity Tolerance Patient tolerated treatment well            Past Medical History:  Diagnosis Date   Allergic rhinitis    Allergy    Cataract    beginnings of cataracts    CHF (congestive heart failure) (HCC)    COPD (chronic obstructive pulmonary disease) (HCC)    Diverticulosis    GERD (gastroesophageal reflux disease)    no issues with CPAP-1999   HTN (hypertension)    Hyperlipidemia    Obesity    Obstructive sleep apnea on CPAP    Paroxysmal atrial fibrillation (HCC) 2005   past trauma and infection   Skin cancer    basel cell on nose,arm,leg   Sleep apnea    with CPAP   Ulcerative colitis    Ulcerative colitis, left sided (HCC)    Past Surgical History:  Procedure Laterality Date   BACK SURGERY  2005   BELPHAROPTOSIS REPAIR     bilateral   COLONOSCOPY  2013   Dr. Albertus    FETAL SURGERY FOR CONGENITAL HERNIA  2001   infection in chest that had to be cleaned out     Staph Infection   POLYPECTOMY     SKIN CANCER EXCISION     squamous and basal cell   TONSILLECTOMY     tubular adenoma  2013   UMBILICAL HERNIA REPAIR     VASECTOMY     Patient Active Problem List   Diagnosis Date Noted   History of colonic polyps 04/28/2022   Obesity (BMI 30-39.9) 07/02/2021   CAP (community acquired pneumonia) 03/18/2021   Plantar fasciitis 01/18/2020   Pain due to onychomycosis of toenails of both feet 09/18/2019   Multiple lung nodules 06/07/2018   Influenza with respiratory manifestation 06/06/2015   Ulcerative  colitis with complication (HCC) 05/03/2011   PURE HYPERCHOLESTEROLEMIA 03/23/2010   PRECORDIAL PAIN 03/23/2010   CHEST TIGHTNESS-PRESSURE-OTHER 03/18/2010   DYSPNEA 11/19/2008   HYPERLIPIDEMIA 02/01/2008   HYPERTENSION 02/01/2008   ATRIAL FIBRILLATION 02/01/2008   Allergic rhinitis 02/01/2008   COPD GOLD II 02/01/2008   Obstructive sleep apnea 02/01/2008    ONSET DATE: 12/06/2023 (referral date)  REFERRING DIAG: G20.A1 (ICD-10-CM) - Parkinson's disease without dyskinesia or fluctuating manifestations (HCC) R49.8 (ICD-10-CM) - Hypophonia  THERAPY DIAG:  Dysarthria and anarthria  Rationale for Evaluation and Treatment: Rehabilitation  SUBJECTIVE:   SUBJECTIVE STATEMENT: I didn't do as much as I should - we had 2 funerals Pt accompanied by: self   PERTINENT HISTORY: PMH: Parkinsonism (recently diagnosed 10/2023), CHF, COPD, HTN, HLD, hx of skin cancer, hx of back injury in 2005  Per Dr. Evonnie: left hand rest tremor for 3 months. Notes from primary care indicate that patient has had bradykinesia with shuffling gait since 2023, now with more recent tremor.   PAIN:  Are you having pain? No   PATIENT REPORTED OUTCOME MEASURES (PROM): Communication Effectiveness Survey: 29 He rated a 1 or  not at all effective for having a conversation at a distance; rated a 2 for conversing at home, in a noisy gathering and in the car. Rated a 3 for conversing with strangers and over the phone                                                                                                                          TREATMENT DATE:   04/02/24: Wyn enters with low, hoarse voice. He continues to complete online home practice sessions. He has not accessed the E Library - demonstrated the difference between the 2 and demonstrated how to navigate to the [employer]. Today, targeted intelligibility with SPEAK OUT! E Library, A Year of Intent December week 2 - With initial modeling, [name] completed exercises  accurately - he averaged 87-90dB on sustained Ah; 84dB on counting and 83dB on reading. Feedback required to ensure Wyn is not exceeding recommended dB with verbal cues and modeling. In conversation he averaged 69-70dB with usual mod verbal, gesture and modeling cues to maintain intentional speech.   03/28/24: Pt notes that he completed his online HEP yesterday with no issues. Pt states that speaking with intent still feels unusual; however, he feels that his voice is slightly stronger, especially after practicing at home.  Targeted volume and intelligibility using Speak Out! Lesson 2 and 3. Pt required frequent min verbal cues, modeling, for volume and breath support.   Pt averages the following volume levels:  Sustained AH: 84 dB   Counting: 87.5 dB  Reading (phrases): 86.5 dB  Cognitive Exercise: 80 dB Required occasional min verbal cues, modeling for carryover of intent /volume answering simple questions following structured practice. SLP provided rare min A for slightly decreasing pt's volume during conversation/cognitive task. Pt was advised to utilize intent regardless of volume level during speech production. Pt is making progress with speech goals and is compliant with HEP. Plan is to continue speech exercises and asking pt about HEP compliance to optimize outcomes.    03/26/24: Wyn and Sharman have watched the Learn about Parkinson's video and accessed home practice reading material. They have not accessed E Library or completed an online home practice session. I assigned this for [facility] today.  Targeted volume and intelligibility using Speak Out! Lesson December Week 1 in the Year of Intent workbook (E [employer]). Pt required usual min verbal cues, modeling initially, for intelligibility. After initial modeling and chorale practice, cues were faded to occasional min A Pt averages the following volume levels:  Sustained AH: 83dB  Counting:80dB  Reading (phrases): 78dB  Cognitive Exercise:  74dB Required usual mod verbal cues, modeling for carryover of intent /volume answering simple questions following structured practice.    03/21/24: Evaluation completed. Initiated training in SPEAK OUT! HEP - Workbook Lesson one - with usual mod modeling and instruction, [name] averaged 77 on loud Ah, 83dB on glides, 88 on counting, indicating improved understanding of speaking with intent as Lesson progressed. Reviewed workbook and demonstrated navigation  of Parkinson Voice Project website to locate the learn about Parkinson's video they are required to watch and an on line home practice session. Requested booklet and E Library access from website.    PATIENT EDUCATION: Education details: HEP for dysarthria, compensations for dysarthria, general swallowing precautions Person educated: Patient Education method: Explanation, Demonstration, Verbal cues, and Handouts Education comprehension: needs further education  HOME EXERCISE PROGRAM: SPEAK OUT!    GOALS: Goals reviewed with patient? Yes  SHORT TERM GOALS: Target date: 04/04/24  Pt will complete HEP for dysarthria with occasional min A Baseline: Goal status: ONGOING  2.  Pt will complete 2 on line home practice sessions or E Library Lessons as part of HEP Baseline:  Goal status: ONGOING  3.  Pt will average 72dB 18/20 sentences Baseline:  Goal status: ONGOING  4.  Pt and family will watch the Learn About Parkinson's video on the Parkinson Voice Project website Baseline:  Goal status: ONGOING  5.  Pt will average 68dB and clear phonation over 5 minute conversation with occasional mion A Baseline:  Goal status: ONGOING  6  LONG TERM GOALS: Target date: 05/16/24  Pt will complete HEP with rare min A Baseline:  Goal status: ONGOING  2.  Pt will complete HEP 5/7 days with rare min A Baseline:  Goal status: ONGOING  3.  Pt will complete 2 E Library lessons and 2 on line home practice sessions for HEP Baseline:   Goal status: ONGOING  4.  Pt will average 70dB over 12  minute conversation with clear phonation and occasional min A Baseline:  Goal status: ONGOING  5.  Pt will verbalize specific plan for continuing HEP upon d/c Baseline:  Goal status: ONGOING  6.  Pt will improve score on PROM - Communicative Effectiveness Survey Baseline:  Goal status: ONGOING  ASSESSMENT:  CLINICAL IMPRESSION: Patient is a 78 y.o. male who was seen today for moderate hypokinetic dysarthria. Volume consistently low and hoarse. He reports hoarseness is due to COPD inhaler, so we will keep this in mind. He reports he is unable to be heard across a room and that his family frequently ask tell him Say it again due to reduced intelligibility. I recommend skilled ST to maximize intelligibility for safety, independence, and to reduce caregiver burden and improve social participation. 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 Degrees from [employer].   OBJECTIVE IMPAIRMENTS: Objective impairments include dysarthria. These impairments are limiting patient from effectively communicating at home and in community.Factors affecting potential to achieve goals and functional outcome are medical prognosis.. Patient will benefit from skilled SLP services to address above impairments and improve overall function.  REHAB POTENTIAL: Good  PLAN:  SLP FREQUENCY: 1-2x/week  SLP DURATION: 8 weeks  PLANNED INTERVENTIONS: Aspiration precaution training, Diet toleration management , Environmental controls, Cueing hierachy, Cognitive reorganization, Internal/external aids, Functional tasks, Multimodal communication approach, SLP instruction and feedback, Compensatory strategies, Patient/family education, [phone] Treatment of speech (30 or 45 min) , 92522- Speech Eval Sound Prod, Articulate, Phonological, and MBSS if  indicated    Waddell Music, CF-SLP 04/02/2024, 2:56 PM

## 2024-04-04 ENCOUNTER — Ambulatory Visit

## 2024-04-04 DIAGNOSIS — R471 Dysarthria and anarthria: Secondary | ICD-10-CM | POA: Diagnosis not present

## 2024-04-04 NOTE — Therapy (Signed)
 OUTPATIENT SPEECH LANGUAGE PATHOLOGY PARKINSON'S TREATMENT   Patient Name: Ricardo Cooper MRN: [id number] DOB:Feb 09, 1946, 78 y.o., male Today's Date: 04/04/2024  PCP: Ricardo Elsie BIRCH, MD REFERRING PROVIDER: Evonnie Asberry RAMAN, DO  END OF SESSION:  End of Session - 04/04/24 1400     Visit Number 5    Number of Visits 9    Date for Recertification  05/16/24    SLP Start Time 1315    SLP Stop Time  1355    SLP Time Calculation (min) 40 min    Activity Tolerance Patient tolerated treatment well             Past Medical History:  Diagnosis Date   Allergic rhinitis    Allergy    Cataract    beginnings of cataracts    CHF (congestive heart failure) (HCC)    COPD (chronic obstructive pulmonary disease) (HCC)    Diverticulosis    GERD (gastroesophageal reflux disease)    no issues with CPAP-1999   HTN (hypertension)    Hyperlipidemia    Obesity    Obstructive sleep apnea on CPAP    Paroxysmal atrial fibrillation (HCC) 2005   past trauma and infection   Skin cancer    basel cell on nose,arm,leg   Sleep apnea    with CPAP   Ulcerative colitis    Ulcerative colitis, left sided (HCC)    Past Surgical History:  Procedure Laterality Date   BACK SURGERY  2005   BELPHAROPTOSIS REPAIR     bilateral   COLONOSCOPY  2013   Dr. Albertus    FETAL SURGERY FOR CONGENITAL HERNIA  2001   infection in chest that had to be cleaned out     Staph Infection   POLYPECTOMY     SKIN CANCER EXCISION     squamous and basal cell   TONSILLECTOMY     tubular adenoma  2013   UMBILICAL HERNIA REPAIR     VASECTOMY     Patient Active Problem List   Diagnosis Date Noted   History of colonic polyps 04/28/2022   Obesity (BMI 30-39.9) 07/02/2021   CAP (community acquired pneumonia) 03/18/2021   Plantar fasciitis 01/18/2020   Pain due to onychomycosis of toenails of both feet 09/18/2019   Multiple lung nodules 06/07/2018   Influenza with respiratory manifestation 06/06/2015    Ulcerative colitis with complication (HCC) 05/03/2011   PURE HYPERCHOLESTEROLEMIA 03/23/2010   PRECORDIAL PAIN 03/23/2010   CHEST TIGHTNESS-PRESSURE-OTHER 03/18/2010   DYSPNEA 11/19/2008   HYPERLIPIDEMIA 02/01/2008   HYPERTENSION 02/01/2008   ATRIAL FIBRILLATION 02/01/2008   Allergic rhinitis 02/01/2008   COPD GOLD II 02/01/2008   Obstructive sleep apnea 02/01/2008    ONSET DATE: 12/06/2023 (referral date)  REFERRING DIAG: G20.A1 (ICD-10-CM) - Parkinson's disease without dyskinesia or fluctuating manifestations (HCC) R49.8 (ICD-10-CM) - Hypophonia  THERAPY DIAG:  Dysarthria and anarthria  Rationale for Evaluation and Treatment: Rehabilitation  SUBJECTIVE:   SUBJECTIVE STATEMENT: I didn't do as much as I should - we had 2 funerals Pt accompanied by: self   PERTINENT HISTORY: PMH: Parkinsonism (recently diagnosed 10/2023), CHF, COPD, HTN, HLD, hx of skin cancer, hx of back injury in 2005  Per Dr. Evonnie: left hand rest tremor for 3 months. Notes from primary care indicate that patient has had bradykinesia with shuffling gait since 2023, now with more recent tremor.   PAIN:  Are you having pain? No   PATIENT REPORTED OUTCOME MEASURES (PROM): Communication Effectiveness Survey: 29 He rated a 1  or not at all effective for having a conversation at a distance; rated a 2 for conversing at home, in a noisy gathering and in the car. Rated a 3 for conversing with strangers and over the phone                                                                                                                          TREATMENT DATE:   04/04/24: Pt enters with low and hoarse voice. Pt is practicing every other day due to busy schedule. Pt tries to practice during other tasks, especially the warm-up, sustained ah's, and glide exercises. SLP educated pt on importance of incorporating intent into daily conversation for carry-over and generalization. Targeted volume and intelligibility using  Speak Out! Lesson 4. Pt required occasional min verbal cues, modeling, for volume and breath support.   Pt averages the following volume levels:  Sustained AH: 88 dB  Counting:85 dB  Reading (phrases): 81 dB  Cognitive Exercise: 78 dB Required occasional min verbal cues, modeling for carryover of intent /volume answering simple questions following structured practice. Pt requiree rare min A for not exceeding dB values during structured exercises. Plan is to continue SPEAK OUT! Program and incorporating idea of intent into conversation-level speech.   12/8/25BETHA Cooper enters with low, hoarse voice. He continues to complete online home practice sessions. He has not accessed the E Library - demonstrated the difference between the 2 and demonstrated how to navigate to the [employer]. Today, targeted intelligibility with SPEAK OUT! E Library, A Year of Intent December week 2 - With initial modeling, Ricardo Cooper completed exercises accurately - he averaged 87-90dB on sustained Ah; 84dB on counting and 83dB on reading. Feedback required to ensure Cooper is not exceeding recommended dB with verbal cues and modeling. In conversation he averaged 69-70dB with usual mod verbal, gesture and modeling cues to maintain intentional speech.   03/28/24: Pt notes that he completed his online HEP yesterday with no issues. Pt states that speaking with intent still feels unusual; however, he feels that his voice is slightly stronger, especially after practicing at home.  Targeted volume and intelligibility using Speak Out! Lesson 2 and 3. Pt required frequent min verbal cues, modeling, for volume and breath support.   Pt averages the following volume levels:  Sustained AH: 84 dB   Counting: 87.5 dB  Reading (phrases): 86.5 dB  Cognitive Exercise: 80 dB Required occasional min verbal cues, modeling for carryover of intent /volume answering simple questions following structured practice. SLP provided rare min A for slightly  decreasing pt's volume during conversation/cognitive task. Pt was advised to utilize intent regardless of volume level during speech production. Pt is making progress with speech goals and is compliant with HEP. Plan is to continue speech exercises and asking pt about HEP compliance to optimize outcomes.    03/26/24: Cooper and Sharman have watched the Learn about Parkinson's video and accessed home practice reading material. They have not accessed E Library  or completed an online home practice session. I assigned this for [facility] today.  Targeted volume and intelligibility using Speak Out! Lesson December Week 1 in the Year of Intent workbook ([employer]). Pt required usual min verbal cues, modeling initially, for intelligibility. After initial modeling and chorale practice, cues were faded to occasional min A Pt averages the following volume levels:  Sustained AH: 83dB  Counting:80dB  Reading (phrases): 78dB  Cognitive Exercise: 74dB Required usual mod verbal cues, modeling for carryover of intent /volume answering simple questions following structured practice.    03/21/24: Evaluation completed. Initiated training in SPEAK OUT! HEP - Workbook Lesson one - with usual mod modeling and instruction, Ricardo Cooper averaged 77 on loud Ah, 83dB on glides, 88 on counting, indicating improved understanding of speaking with intent as Lesson progressed. Reviewed workbook and demonstrated navigation of Parkinson Voice Project website to locate the learn about Parkinson's video they are required to watch and an on line home practice session. Requested booklet and E Library access from website.    PATIENT EDUCATION: Education details: HEP for dysarthria, compensations for dysarthria, general swallowing precautions Person educated: Patient Education method: Explanation, Demonstration, Verbal cues, and Handouts Education comprehension: needs further education  HOME EXERCISE PROGRAM: SPEAK OUT!    GOALS: Goals reviewed  with patient? Yes  SHORT TERM GOALS: Target date: 04/04/24  Pt will complete HEP for dysarthria with occasional min A Baseline: Goal status: ONGOING  2.  Pt will complete 2 on line home practice sessions or E Library Lessons as part of HEP Baseline:  Goal status: ONGOING  3.  Pt will average 72dB 18/20 sentences Baseline:  Goal status: ONGOING  4.  Pt and family will watch the Learn About Parkinson's video on the Parkinson Voice [employer] website Baseline:  Goal status: ONGOING  5.  Pt will average 68dB and clear phonation over 5 minute conversation with occasional mion A Baseline:  Goal status: ONGOING  6  LONG TERM GOALS: Target date: 05/16/24  Pt will complete HEP with rare min A Baseline:  Goal status: ONGOING  2.  Pt will complete HEP 5/7 days with rare min A Baseline:  Goal status: ONGOING  3.  Pt will complete 2 E Library lessons and 2 on line home practice sessions for HEP Baseline:  Goal status: ONGOING  4.  Pt will average 70dB over 12  minute conversation with clear phonation and occasional min A Baseline:  Goal status: ONGOING  5.  Pt will verbalize specific plan for continuing HEP upon d/c Baseline:  Goal status: ONGOING  6.  Pt will improve score on PROM - Communicative Effectiveness Survey Baseline:  Goal status: ONGOING  ASSESSMENT:  CLINICAL IMPRESSION: Patient is a 78 y.o. male who was seen today for moderate hypokinetic dysarthria. Volume consistently low and hoarse. He reports hoarseness is due to COPD inhaler, so we will keep this in mind. He reports he is unable to be heard across a room and that his family frequently ask tell him Say it again due to reduced intelligibility. I recommend skilled ST to maximize intelligibility for safety, independence, and to reduce caregiver burden and improve social participation. 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 Degrees from [employer]   OBJECTIVE IMPAIRMENTS: Objective impairments include dysarthria. These impairments are limiting patient from effectively communicating at home and in community.Factors affecting potential to achieve goals and  functional outcome are medical prognosis.. Patient will benefit from skilled SLP services to address above impairments and improve overall function.  REHAB POTENTIAL: Good  PLAN:  SLP FREQUENCY: 1-2x/week  SLP DURATION: 8 weeks  PLANNED INTERVENTIONS: Aspiration precaution training, Diet toleration management , Environmental controls, Cueing hierachy, Cognitive reorganization, Internal/external aids, Functional tasks, Multimodal communication approach, SLP instruction and feedback, Compensatory strategies, Patient/family education, [phone] Treatment of speech (30 or 45 min) , 92522- Speech Eval Sound Prod, Articulate, Phonological, and MBSS if indicated    [employer]ell [employer], CF-SLP 04/04/2024, 2:00 PM

## 2024-04-09 ENCOUNTER — Encounter: Payer: Self-pay | Admitting: Speech Pathology

## 2024-04-09 ENCOUNTER — Encounter (HOSPITAL_BASED_OUTPATIENT_CLINIC_OR_DEPARTMENT_OTHER): Payer: Self-pay

## 2024-04-09 ENCOUNTER — Ambulatory Visit: Admitting: Speech Pathology

## 2024-04-09 DIAGNOSIS — R471 Dysarthria and anarthria: Secondary | ICD-10-CM

## 2024-04-09 NOTE — Therapy (Signed)
 OUTPATIENT SPEECH LANGUAGE PATHOLOGY [facility]REAT[facility]   Patient Name: Ricardo Cooper MRN: [id number] DOB:October 19, 1945, 78 y.o., male Today's Date: 04/09/2024  PCP: Ricardo Elsie BIRCH, MD REFERRING PROVIDER: Evonnie Asberry RAMAN, DO  END OF SESSION:  End of Session - 04/09/24 1316     Visit Number 6    Number of Visits 9    Date for Recertification  05/16/24    SLP Start Time 1315    SLP Stop Time  1400    SLP Time Calculation (min) 45 min    Activity Tolerance Patient tolerated treatment well             Past Medical History:  Diagnosis Date   Allergic rhinitis    Allergy    Cataract    beginnings of cataracts    CHF (congestive heart failure) (HCC)    COPD (chronic obstructive pulmonary disease) (HCC)    Diverticulosis    GERD (gastroesophageal reflux disease)    no issues with CPAP-1999   HTN (hypertension)    Hyperlipidemia    Obesity    Obstructive sleep apnea on CPAP    Paroxysmal atrial fibrillation (HCC) 2005   past trauma and infection   Skin cancer    basel cell on nose,arm,leg   Sleep apnea    with CPAP   Ulcerative colitis    Ulcerative colitis, left sided (HCC)    Past Surgical History:  Procedure Laterality Date   BACK SURGERY  2005   BELPHAROPTOSIS REPAIR     bilateral   COLONOSCOPY  2013   Dr. Albertus    FETAL SURGERY FOR CONGENITAL HERNIA  2001   infection in chest that had to be cleaned out     Staph Infection   POLYPECTOMY     SKIN CANCER EXCISION     squamous and basal cell   TONSILLECTOMY     tubular adenoma  2013   UMBILICAL HERNIA REPAIR     VASECTOMY     Patient Active Problem List   Diagnosis Date Noted   History of colonic polyps 04/28/2022   Obesity (BMI 30-39.9) 07/02/2021   CAP (community acquired pneumonia) 03/18/2021   Plantar fasciitis 01/18/2020   Pain due to onychomycosis of toenails of both feet 09/18/2019   Multiple lung nodules 06/07/2018   Influenza with respiratory manifestation 06/06/2015    Ulcerative colitis with complication (HCC) 05/03/2011   PURE HYPERCHOLESTEROLEMIA 03/23/2010   PRECORDIAL PAIN 03/23/2010   CHEST TIGHTNESS-PRESSURE-OTHER 03/18/2010   DYSPNEA 11/19/2008   HYPERLIPIDEMIA 02/01/2008   HYPERTENSION 02/01/2008   ATRIAL FIBRILLATION 02/01/2008   Allergic rhinitis 02/01/2008   COPD GOLD II 02/01/2008   Obstructive sleep apnea 02/01/2008    ONSET DATE: 12/06/2023 (referral date)  REFERRING DIAG: G20.A1 (ICD-10-CM) - Parkinson's disease without dyskinesia or fluctuating manifestations (HCC) R49.8 (ICD-10-CM) - Hypophonia  THERAPY DIAG:  Dysarthria and anarthria  Rationale for Evaluation and Treatment: Rehabilitation  SUBJECTIVE:   SUBJECTIVE STATEMENT: I didn't do as much as I should - we had 2 funerals Pt accompanied by: self   PERTINENT HISTORY: PMH: Parkinsonism (recently diagnosed 10/2023), CHF, COPD, HTN, HLD, hx of skin cancer, hx of back injury in 2005  Per Dr. Evonnie: left hand rest tremor for 3 months. Notes from primary care indicate that patient has had bradykinesia with shuffling gait since 2023, now with more recent tremor.   PAIN:  Are you having pain? No   PATIENT REPORTED OUTCOME MEASURES (PROM): Communication Effectiveness Survey: 29 He rated a 1  or not at all effective for having a conversation at a distance; rated a 2 for conversing at home, in a noisy gathering and in the car. Rated a 3 for conversing with strangers and over the phone                                                                                                                          TREATMENT DATE:   04/09/24: Pt seen today with low and hoarse voice. Pt reported he knows what he needs to do in terms of communicating with intent. Expressed I just need to do it more intentionally. SLP provided encouragement and reviewed order of Speak Out exercises for HEP. Pt verbalized understanding through teach back. Targeted volume and intelligibility using Speak Out!  Lesson 5. Pt required min verbal cues, modeling, for volume and breath support.   Pt averages the following volume levels:  Sustained AH: 87 dB   Counting: 85 dB  Reading (phrases): 77 dB  Cognitive Exercise: 65 dB Required min verbal cues, modeling for carryover of intent /volume answering simple questions following structured practice.  Patient continues to benefit from increased reminders to increase vocal loudness during conversational speech and communicating with intent. Demonstrated reduced self monitoring as session progressed likely secondary to fatigue.   04/04/24: Pt enters with low and hoarse voice. Pt is practicing every other day due to busy schedule. Pt tries to practice during other tasks, especially the warm-up, sustained ah's, and glide exercises. SLP educated pt on importance of incorporating intent into daily conversation for carry-over and generalization. Targeted volume and intelligibility using Speak Out! Lesson 4. Pt required occasional min verbal cues, modeling, for volume and breath support.   Pt averages the following volume levels:  Sustained AH: 88 dB  Counting:85 dB  Reading (phrases): 81 dB  Cognitive Exercise: 78 dB Required occasional min verbal cues, modeling for carryover of intent /volume answering simple questions following structured practice. Pt requiree rare min A for not exceeding dB values during structured exercises. Plan is to continue SPEAK OUT! Program and incorporating idea of intent into conversation-level speech.   12/8/25BETHA Ricardo Cooper enters with low, hoarse voice. He continues to complete online home practice sessions. He has not accessed the E Library - demonstrated the difference between the 2 and demonstrated how to navigate to the [employer]. Today, targeted intelligibility with SPEAK OUT! E Library, A Year of Intent December week 2 - With initial modeling, Ricardo Cooper completed exercises accurately - he averaged 87-90dB on sustained Ah; 84dB on  counting and 83dB on reading. Feedback required to ensure Ricardo Cooper is not exceeding recommended dB with verbal cues and modeling. In conversation he averaged 69-70dB with usual mod verbal, gesture and modeling cues to maintain intentional speech.   03/28/24: Pt notes that he completed his online HEP yesterday with no issues. Pt states that speaking with intent still feels unusual; however, he feels that his voice is slightly stronger, especially after practicing at home.  Targeted volume and intelligibility using Speak Out! Lesson 2 and 3. Pt required frequent min verbal cues, modeling, for volume and breath support.   Pt averages the following volume levels:  Sustained AH: 84 dB   Counting: 87.5 dB  Reading (phrases): 86.5 dB  Cognitive Exercise: 80 dB Required occasional min verbal cues, modeling for carryover of intent /volume answering simple questions following structured practice. SLP provided rare min A for slightly decreasing pt's volume during conversation/cognitive task. Pt was advised to utilize intent regardless of volume level during speech production. Pt is making progress with speech goals and is compliant with HEP. Plan is to continue speech exercises and asking pt about HEP compliance to optimize outcomes.    03/26/24: Wyn and Sharman have watched the Learn about Parkinson's video and accessed home practice reading material. They have not accessed E Library or completed an online home practice session. I assigned this for [facility] today.  Targeted volume and intelligibility using Speak Out! Lesson December Week 1 in the Year of Intent workbook ([employer]). Pt required usual min verbal cues, modeling initially, for intelligibility. After initial modeling and chorale practice, cues were faded to occasional min A Pt averages the following volume levels:  Sustained AH: 83dB  Counting:80dB  Reading (phrases): 78dB  Cognitive Exercise: 74dB Required usual mod verbal cues, modeling for  carryover of intent /volume answering simple questions following structured practice.    03/21/24: Evaluation completed. Initiated training in SPEAK OUT! HEP - Workbook Lesson one - with usual mod modeling and instruction, Ricardo Cooper averaged 77 on loud Ah, 83dB on glides, 88 on counting, indicating improved understanding of speaking with intent as Lesson progressed. Reviewed workbook and demonstrated navigation of Parkinson Voice Project website to locate the learn about Parkinson's video they are required to watch and an on line home practice session. Requested booklet and E Library access from website.    PATIENT EDUCATION: Education details: HEP for dysarthria, compensations for dysarthria, general swallowing precautions Person educated: Patient Education method: Explanation, Demonstration, Verbal cues, and Handouts Education comprehension: needs further education  HOME EXERCISE PROGRAM: SPEAK OUT!    GOALS: Goals reviewed with patient? Yes  SHORT TERM GOALS: Target date: 04/04/24  Pt will complete HEP for dysarthria with occasional min A Baseline: Goal status: ONGOING  2.  Pt will complete 2 on line home practice sessions or E Library Lessons as part of HEP Baseline:  Goal status: ONGOING  3.  Pt will average 72dB 18/20 sentences Baseline:  Goal status: ONGOING  4.  Pt and family will watch the Learn About Parkinson's video on the Parkinson Voice Project website Baseline:  Goal status: ONGOING  5.  Pt will average 68dB and clear phonation over 5 minute conversation with occasional mion A Baseline:  Goal status: ONGOING  6  LONG TERM GOALS: Target date: 05/16/24  Pt will complete HEP with rare min A Baseline:  Goal status: ONGOING  2.  Pt will complete HEP 5/7 days with rare min A Baseline:  Goal status: ONGOING  3.  Pt will complete 2 E Library lessons and 2 on line home practice sessions for HEP Baseline:  Goal status: ONGOING  4.  Pt will average 70dB over  12  minute conversation with clear phonation and occasional min A Baseline:  Goal status: ONGOING  5.  Pt will verbalize specific plan for continuing HEP upon d/c Baseline:  Goal status: ONGOING  6.  Pt will improve score on PROM - Communicative Effectiveness Survey Baseline:  Goal status: ONGOING  ASSESSMENT:  CLINICAL IMPRESSION: Patient is a 78 y.o. male who was seen today for moderate hypokinetic dysarthria. Volume consistently low and hoarse. He reports hoarseness is due to COPD inhaler, so we will keep this in mind. He reports he is unable to be heard across a room and that his family frequently ask tell him Say it again due to reduced intelligibility. I recommend skilled ST to maximize intelligibility for safety, independence, and to reduce caregiver burden and improve social participation. 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 Degrees from [employer].   OBJECTIVE IMPAIRMENTS: Objective impairments include dysarthria. These impairments are limiting patient from effectively communicating at home and in community.Factors affecting potential to achieve goals and functional outcome are medical prognosis.. Patient will benefit from skilled SLP services to address above impairments and improve overall function.  REHAB POTENTIAL: Good  PLAN:  SLP FREQUENCY: 1-2x/week  SLP DURATION: 8 weeks  PLANNED INTERVENTIONS: Aspiration precaution training, Diet toleration management , Environmental controls, Cueing hierachy, Cognitive reorganization, Internal/external aids, Functional tasks, Multimodal communication approach, SLP instruction and feedback, Compensatory strategies, Patient/family education, [phone] Treatment of speech (30 or 45 min) , 92522- Speech Eval Sound Prod, Articulate, Phonological, and MBSS if indicated  Mathis Doffing MS, CCC-SLP 04/09/2024, 1:17  PM

## 2024-04-11 ENCOUNTER — Ambulatory Visit

## 2024-04-11 DIAGNOSIS — R471 Dysarthria and anarthria: Secondary | ICD-10-CM | POA: Diagnosis not present

## 2024-04-11 NOTE — Therapy (Signed)
 OUTPATIENT SPEECH LANGUAGE PATHOLOGY PARKINSON'S TREATMENT   Patient Name: Ricardo Cooper MRN: [id number] DOB:03/06/1946, 78 y.o., male Today's Date: 04/11/2024  PCP: Loreli Elsie BIRCH, MD REFERRING PROVIDER: Evonnie Asberry RAMAN, DO  END OF SESSION:  End of Session - 04/11/24 1359     Visit Number 7    Number of Visits 9    Date for Recertification  05/16/24    SLP Start Time 1321    SLP Stop Time  1400    SLP Time Calculation (min) 39 min    Activity Tolerance Patient tolerated treatment well              Past Medical History:  Diagnosis Date   Allergic rhinitis    Allergy    Cataract    beginnings of cataracts    CHF (congestive heart failure) (HCC)    COPD (chronic obstructive pulmonary disease) (HCC)    Diverticulosis    GERD (gastroesophageal reflux disease)    no issues with CPAP-1999   HTN (hypertension)    Hyperlipidemia    Obesity    Obstructive sleep apnea on CPAP    Paroxysmal atrial fibrillation (HCC) 2005   past trauma and infection   Skin cancer    basel cell on nose,arm,leg   Sleep apnea    with CPAP   Ulcerative colitis    Ulcerative colitis, left sided (HCC)    Past Surgical History:  Procedure Laterality Date   BACK SURGERY  2005   BELPHAROPTOSIS REPAIR     bilateral   COLONOSCOPY  2013   Dr. Albertus    FETAL SURGERY FOR CONGENITAL HERNIA  2001   infection in chest that had to be cleaned out     Staph Infection   POLYPECTOMY     SKIN CANCER EXCISION     squamous and basal cell   TONSILLECTOMY     tubular adenoma  2013   UMBILICAL HERNIA REPAIR     VASECTOMY     Patient Active Problem List   Diagnosis Date Noted   History of colonic polyps 04/28/2022   Obesity (BMI 30-39.9) 07/02/2021   CAP (community acquired pneumonia) 03/18/2021   Plantar fasciitis 01/18/2020   Pain due to onychomycosis of toenails of both feet 09/18/2019   Multiple lung nodules 06/07/2018   Influenza with respiratory manifestation 06/06/2015    Ulcerative colitis with complication (HCC) 05/03/2011   PURE HYPERCHOLESTEROLEMIA 03/23/2010   PRECORDIAL PAIN 03/23/2010   CHEST TIGHTNESS-PRESSURE-OTHER 03/18/2010   DYSPNEA 11/19/2008   HYPERLIPIDEMIA 02/01/2008   HYPERTENSION 02/01/2008   ATRIAL FIBRILLATION 02/01/2008   Allergic rhinitis 02/01/2008   COPD GOLD II 02/01/2008   Obstructive sleep apnea 02/01/2008    ONSET DATE: 12/06/2023 (referral date)  REFERRING DIAG: G20.A1 (ICD-10-CM) - Parkinson's disease without dyskinesia or fluctuating manifestations (HCC) R49.8 (ICD-10-CM) - Hypophonia  THERAPY DIAG:  Dysarthria and anarthria  Rationale for Evaluation and Treatment: Rehabilitation  SUBJECTIVE:   SUBJECTIVE STATEMENT: I didn't do as much as I should - we had 2 funerals Pt accompanied by: self   PERTINENT HISTORY: PMH: Parkinsonism (recently diagnosed 10/2023), CHF, COPD, HTN, HLD, hx of skin cancer, hx of back injury in 2005  Per Dr. Evonnie: left hand rest tremor for 3 months. Notes from primary care indicate that patient has had bradykinesia with shuffling gait since 2023, now with more recent tremor.   PAIN:  Are you having pain? No   PATIENT REPORTED OUTCOME MEASURES (PROM): Communication Effectiveness Survey: 29 He rated a  1 or not at all effective for having a conversation at a distance; rated a 2 for conversing at home, in a noisy gathering and in the car. Rated a 3 for conversing with strangers and over the phone                                                                                                                          TREATMENT DATE:   04/11/24: Pt continues to practice HEP daily; although, pt noted that he took one day off. Pt only cleared his throat once during session. SLP provided rare min A for taking a sip of water instead of throat clear.  Targeted volume and intelligibility using Speak Out! Lesson 6. Pt required rare min verbal cues, modeling, for volume and breath support.   Pt  averages the following volume levels:  Sustained AH: 86 dB.  Counting: 85 dB  Reading (phrases): 80 dB  Cognitive Exercise: 76 dB Required rare min verbal cues, modeling for carryover of intent /volume answering simple questions following structured practice. SLP challenged pt to utilize intent during conversation. Pt was successful with utilizing a vocal volume of >68 dB at least 70% of the time. Pt required frequent min A for utilizing intent during conversation. SLP educated pt about using sticky notes as visual cues for using intent during daily tasks, when speaking. Pt is agreeable and willing to try using sticky notes as visual cues. Plan is to continue SPEAKOUT! Program and integrating speech intent into conversation for generalization.    04/09/24: Pt seen today with low and hoarse voice. Pt reported he knows what he needs to do in terms of communicating with intent. Expressed I just need to do it more intentionally. SLP provided encouragement and reviewed order of Speak Out exercises for HEP. Pt verbalized understanding through teach back. Targeted volume and intelligibility using Speak Out! Lesson 5. Pt required min verbal cues, modeling, for volume and breath support.   Pt averages the following volume levels:  Sustained AH: 87 dB   Counting: 85 dB  Reading (phrases): 77 dB  Cognitive Exercise: 65 dB Required min verbal cues, modeling for carryover of intent /volume answering simple questions following structured practice.  Patient continues to benefit from increased reminders to increase vocal loudness during conversational speech and communicating with intent. Demonstrated reduced self monitoring as session progressed likely secondary to fatigue.   04/04/24: Pt enters with low and hoarse voice. Pt is practicing every other day due to busy schedule. Pt tries to practice during other tasks, especially the warm-up, sustained ah's, and glide exercises. SLP educated pt on importance  of incorporating intent into daily conversation for carry-over and generalization. Targeted volume and intelligibility using Speak Out! Lesson 4. Pt required occasional min verbal cues, modeling, for volume and breath support.   Pt averages the following volume levels:  Sustained AH: 88 dB  Counting:85 dB  Reading (phrases): 81 dB  Cognitive Exercise: 78 dB Required occasional min verbal  cues, modeling for carryover of intent /volume answering simple questions following structured practice. Pt requiree rare min A for not exceeding dB values during structured exercises. Plan is to continue SPEAK OUT! Program and incorporating idea of intent into conversation-level speech.   12/8/25[name] enters with low, hoarse voice. He continues to complete online home practice sessions. He has not accessed the E Library - demonstrated the difference between the 2 and demonstrated how to navigate to the [employer]. Today, targeted intelligibility with SPEAK OUT! E Library, A Year of Intent December week 2 - With initial modeling, [name] completed exercises accurately - he averaged 87-90dB on sustained Ah; 84dB on counting and 83dB on reading. Feedback required to ensure [name] is not exceeding recommended dB with verbal cues and modeling. In conversation he averaged 69-70dB with usual mod verbal, gesture and modeling cues to maintain intentional speech.   03/28/24: Pt notes that he completed his online HEP yesterday with no issues. Pt states that speaking with intent still feels unusual; however, he feels that his voice is slightly stronger, especially after practicing at home.  Targeted volume and intelligibility using Speak Out! Lesson 2 and 3. Pt required frequent min verbal cues, modeling, for volume and breath support.   Pt averages the following volume levels:  Sustained AH: 84 dB   Counting: 87.5 dB  Reading (phrases): 86.5 dB  Cognitive Exercise: 80 dB Required occasional min verbal cues, modeling for  carryover of intent /volume answering simple questions following structured practice. SLP provided rare min A for slightly decreasing pt's volume during conversation/cognitive task. Pt was advised to utilize intent regardless of volume level during speech production. Pt is making progress with speech goals and is compliant with HEP. Plan is to continue speech exercises and asking pt about HEP compliance to optimize outcomes.    03/26/24: [name] and Sharman have watched the Learn about Parkinson's video and accessed home practice reading material. They have not accessed E Library or completed an online home practice session. I assigned this for [facility] today.  Targeted volume and intelligibility using Speak Out! Lesson December Week 1 in the Year of Intent workbook ([employer]). Pt required usual min verbal cues, modeling initially, for intelligibility. After initial modeling and chorale practice, cues were faded to occasional min A Pt averages the following volume levels:  Sustained AH: 83dB  Counting:80dB  Reading (phrases): 78dB  Cognitive Exercise: 74dB Required usual mod verbal cues, modeling for carryover of intent /volume answering simple questions following structured practice.    03/21/24: Evaluation completed. Initiated training in SPEAK OUT! HEP - Workbook Lesson one - with usual mod modeling and instruction, [name] averaged 77 on loud Ah, 83dB on glides, 88 on counting, indicating improved understanding of speaking with intent as Lesson progressed. Reviewed workbook and demonstrated navigation of [employer] website to locate the learn about Parkinson's video they are required to watch and an on line home practice session. Requested booklet and E Library access from website.    PATIENT EDUCATION: Education details: HEP for dysarthria, compensations for dysarthria, general swallowing precautions Person educated: Patient Education method: Explanation, Demonstration, Verbal cues, and  Handouts Education comprehension: needs further education  HOME EXERCISE PROGRAM: SPEAK OUT!    GOALS: Goals reviewed with patient? Yes  SHORT TERM GOALS: Target date: 04/04/24  Pt will complete HEP for dysarthria with occasional min A Baseline: Goal status: ONGOING  2.  Pt will complete 2 on line home practice sessions or E Library Lessons as part  of HEP Baseline:  Goal status: ONGOING  3.  Pt will average 72dB 18/20 sentences Baseline:  Goal status: ONGOING  4.  Pt and family will watch the Learn About Parkinson's video on the [employer] website Baseline:  Goal status: ONGOING  5.  Pt will average 68dB and clear phonation over 5 minute conversation with occasional mion A Baseline:  Goal status: ONGOING  6  LONG TERM GOALS: Target date: 05/16/24  Pt will complete HEP with rare min A Baseline:  Goal status: ONGOING  2.  Pt will complete HEP 5/7 days with rare min A Baseline:  Goal status: ONGOING  3.  Pt will complete 2 E Library lessons and 2 on line home practice sessions for HEP Baseline:  Goal status: ONGOING  4.  Pt will average 70dB over 12  minute conversation with clear phonation and occasional min A Baseline:  Goal status: ONGOING  5.  Pt will verbalize specific plan for continuing HEP upon d/c Baseline:  Goal status: ONGOING  6.  Pt will improve score on PROM - Communicative Effectiveness Survey Baseline:  Goal status: ONGOING  ASSESSMENT:  CLINICAL IMPRESSION: Patient is a 78 y.o. male who was seen today for moderate hypokinetic dysarthria. Volume consistently low and hoarse. He reports hoarseness is due to COPD inhaler, so we will keep this in mind. He reports he is unable to be heard across a room and that his family frequently ask tell him Say it again due to reduced intelligibility. I recommend skilled ST to maximize intelligibility for safety, independence, and to reduce caregiver burden and improve social participation. 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 Degrees from [employer].   OBJECTIVE IMPAIRMENTS: Objective impairments include dysarthria. These impairments are limiting patient from effectively communicating at home and in community.Factors affecting potential to achieve goals and functional outcome are medical prognosis.. Patient will benefit from skilled SLP services to address above impairments and improve overall function.  REHAB POTENTIAL: Good  PLAN:  SLP FREQUENCY: 1-2x/week  SLP DURATION: 8 weeks  PLANNED INTERVENTIONS: Aspiration precaution training, Diet toleration management , Environmental controls, Cueing hierachy, Cognitive reorganization, Internal/external aids, Functional tasks, Multimodal communication approach, SLP instruction and feedback, Compensatory strategies, Patient/family education, [phone] Treatment of speech (30 or 45 min) , [id number]- Speech Eval Sound Prod, Articulate, Phonological, and MBSS if indicated  Koleen Birmingham, MA, CF-SLP 04/11/2024, 2:04 PM

## 2024-04-14 ENCOUNTER — Encounter (HOSPITAL_COMMUNITY): Payer: Self-pay | Admitting: Emergency Medicine

## 2024-04-14 ENCOUNTER — Ambulatory Visit (HOSPITAL_COMMUNITY)
Admission: EM | Admit: 2024-04-14 | Discharge: 2024-04-14 | Disposition: A | Discharge status: Home / Self Care | Attending: Emergency Medicine | Admitting: Emergency Medicine

## 2024-04-14 ENCOUNTER — Ambulatory Visit (HOSPITAL_COMMUNITY)

## 2024-04-14 DIAGNOSIS — R051 Acute cough: Secondary | ICD-10-CM | POA: Diagnosis not present

## 2024-04-14 DIAGNOSIS — B9789 Other viral agents as the cause of diseases classified elsewhere: Secondary | ICD-10-CM | POA: Diagnosis not present

## 2024-04-14 DIAGNOSIS — J988 Other specified respiratory disorders: Secondary | ICD-10-CM | POA: Diagnosis not present

## 2024-04-14 LAB — POC COVID19/FLU A&B COMBO
Covid Antigen, POC: NEGATIVE
Influenza A Antigen, POC: NEGATIVE
Influenza B Antigen, POC: NEGATIVE

## 2024-04-14 MED ORDER — BENZONATATE 100 MG PO CAPS: 100.0000 mg | ORAL_CAPSULE | Freq: Three times a day (TID) | ORAL | 0 refills | Status: AC

## 2024-04-14 MED ORDER — PREDNISONE 20 MG PO TABS: 40.0000 mg | ORAL_TABLET | Freq: Every day | ORAL | 0 refills | Status: AC

## 2024-04-14 MED ORDER — AMOXICILLIN 500 MG PO CAPS: 500.0000 mg | ORAL_CAPSULE | Freq: Two times a day (BID) | ORAL | 0 refills | Status: AC

## 2024-04-14 MED ORDER — AZITHROMYCIN 250 MG PO TABS: ORAL_TABLET | ORAL | 0 refills | Status: DC

## 2024-04-14 NOTE — ED Triage Notes (Signed)
 Pt reports yesterday started having cough, congestion, body aches. Took tylenol  this morning but nothing since.

## 2024-04-14 NOTE — Discharge Instructions (Signed)
 Your x-ray is negative for any active pneumonia at this time. As discussed I believe your symptoms are likely related to a viral bronchitis. Start taking 2 tablets of prednisone  once daily for 5 days for treatment of this. Due to your various lung comorbidities I have prescribed antibiotics in attempt to prevent any development of pneumonia. Start taking azithromycin  by taking 2 tablets today and 1 tablet on the for many days. Also start taking amoxicillin  twice daily for 7 days. I prescribed Tessalon  that you can take every 8 hours as needed for cough. You can also take plain Mucinex  (guaifenesin ) to help with cough and congestion as well. Use your albuterol  inhaler every 4-6 hours as needed for shortness of breath or wheezing. Follow-up with your primary care provider or return here as needed. If you develop severe difficulty breathing, severe chest pain, persistent fevers, or severe weakness please seek immediate medical treatment in the emergency department.

## 2024-04-14 NOTE — ED Provider Notes (Signed)
 " [facility]    CSN: [id number] Arrival date & time: 04/14/24  1600      History   Chief Complaint Chief Complaint  Patient presents with   Generalized Body Aches   Cough    HPI Ricardo Cooper is a 78 y.o. male.   Patient presents with cough, chest congestion, body aches, chills, and fever that began yesterday evening.  Patient states that he has had some chest discomfort with coughing and some mild shortness of breath.  Patient reports that he did take Tylenol  this morning with some relief of bodyaches.  Patient reports that wife was sick with cold-like symptoms a few days ago.  Past medical history includes COPD, CHF, hypertension, hyperlipidemia, A-fib, and GERD.  Patient reports that he has had pneumonia in the past and this usually progresses very quickly with only a few days of symptoms so therefore he and his wife are concerned that he may have developed this again.  The history is provided by the patient and medical records.  Cough   Past Medical History:  Diagnosis Date   Allergic rhinitis    Allergy    Cataract    beginnings of cataracts    CHF (congestive heart failure) (HCC)    COPD (chronic obstructive pulmonary disease) (HCC)    Diverticulosis    GERD (gastroesophageal reflux disease)    no issues with CPAP-1999   HTN (hypertension)    Hyperlipidemia    Obesity    Obstructive sleep apnea on CPAP    Paroxysmal atrial fibrillation (HCC) 2005   past trauma and infection   Skin cancer    basel cell on nose,arm,leg   Sleep apnea    with CPAP   Ulcerative colitis    Ulcerative colitis, left sided [facility])     Patient Active Problem List   Diagnosis Date Noted   History of colonic polyps 04/28/2022   Obesity (BMI 30-39.9) 07/02/2021   CAP (community acquired pneumonia) 03/18/2021   Plantar fasciitis 01/18/2020   Pain due to onychomycosis of toenails of both feet 09/18/2019   Multiple lung nodules 06/07/2018   Influenza with respiratory  manifestation 06/06/2015   Ulcerative colitis with complication (HCC) 05/03/2011   PURE HYPERCHOLESTEROLEMIA 03/23/2010   PRECORDIAL PAIN 03/23/2010   CHEST TIGHTNESS-PRESSURE-OTHER 03/18/2010   DYSPNEA 11/19/2008   HYPERLIPIDEMIA 02/01/2008   HYPERTENSION 02/01/2008   ATRIAL FIBRILLATION 02/01/2008   Allergic rhinitis 02/01/2008   COPD GOLD II 02/01/2008   Obstructive sleep apnea 02/01/2008    Past Surgical History:  Procedure Laterality Date   BACK SURGERY  2005   BELPHAROPTOSIS REPAIR     bilateral   COLONOSCOPY  2013   Dr. Albertus    FETAL SURGERY FOR CONGENITAL HERNIA  2001   infection in chest that had to be cleaned out     Staph Infection   POLYPECTOMY     SKIN CANCER EXCISION     squamous and basal cell   TONSILLECTOMY     tubular adenoma  2013   UMBILICAL HERNIA REPAIR     VASECTOMY         Home Medications    Prior to Admission medications  Medication Sig Start Date End Date Taking? Authorizing Provider  amoxicillin  (AMOXIL ) 500 MG capsule Take 1 capsule (500 mg total) by mouth 2 (two) times daily for 7 days. 04/14/24 04/21/24 Yes Hollynn Garno A, NP  azithromycin  (ZITHROMAX  Z-PAK) 250 MG tablet Take 2 pills (500mg ) first day and one pill (250mg )  the remaining 4 days. 04/14/24  Yes Johnie, Rodrigues Urbanek A, NP  benzonatate  (TESSALON ) 100 MG capsule Take 1 capsule (100 mg total) by mouth every 8 (eight) hours. 04/14/24  Yes Johnie, Scarlettrose Costilow A, NP  predniSONE  (DELTASONE ) 20 MG tablet Take 2 tablets (40 mg total) by mouth daily for 5 days. 04/14/24 04/19/24 Yes Johnie, Danyal Whitenack A, NP  albuterol  (VENTOLIN  HFA) 108 (90 Base) MCG/ACT inhaler Inhale 1-2 puffs into the lungs every 6 (six) hours as needed. 01/07/23   Parrett, Madelin RAMAN, NP  carbidopa -levodopa  (SINEMET  IR) 25-100 MG tablet Take 1 tablet by mouth 3 (three) times daily. 8am/noon/4pm 03/15/24   Tat, Asberry RAMAN, DO  ezetimibe (ZETIA) 10 MG tablet Take 10 mg by mouth daily.    [provider]  fish  oil-omega-3 fatty acids 1000 MG capsule Take 2 g by mouth daily.    [provider]  Fluticasone -Umeclidin-Vilant (TRELEGY ELLIPTA ) 100-62.5-25 MCG/ACT AEPB TAKE 1 PUFF BY MOUTH EVERY DAY 01/23/24   Parrett, Madelin RAMAN, NP  folic acid  (FOLVITE ) 1 MG tablet Take 1 tablet (1 mg total) by mouth daily. 08/17/23 08/11/24  [place], PA-C  losartan -hydrochlorothiazide  (HYZAAR) 50-12.5 MG tablet TAKE 1 TABLET BY MOUTH EVERY DAY 03/14/24   Colletta Manuelita Garre, PA-C  mercaptopurine  (PURINETHOL ) 50 MG tablet Take 2 tablets (100 mg total) by mouth daily. Give on an empty stomach 1 hour before or 2 hours after meals. Caution: Chemotherapy. 08/17/23 08/11/24  [place], PA-C  Multiple Vitamins-Minerals (PRESERVISION AREDS 2) CAPS take 1 po BID Oral 12/28/17   [provider]  pravastatin (PRAVACHOL) 40 MG tablet Take 40 mg by mouth daily.    [provider]  silodosin (RAPAFLO) 8 MG CAPS capsule Take 8 mg by mouth daily with breakfast. Patient taking differently: Take 8 mg by mouth at bedtime.    [provider]  sulfaSALAzine  (AZULFIDINE ) 500 MG tablet Take 2 tablets (1,000 mg total) by mouth 3 (three) times daily. 08/17/23 08/11/24  [place], PA-C  tadalafil (CIALIS) 5 MG tablet Take 5 mg by mouth at bedtime. 12/24/20   [provider]  TIADYLT  ER 180 MG 24 hr capsule TAKE 1 CAPSULE BY MOUTH EVERY DAY 11/30/23   Bensimhon, Toribio SAUNDERS, MD  vitamin B-12 (CYANOCOBALAMIN ) 250 MCG tablet Take 1,000 mcg by mouth daily. Patient not taking: Reported on 12/05/2023    [provider]  Vitamin D, Cholecalciferol, 25 MCG (1000 UT) CAPS Take 1 capsule by mouth daily. 05/05/10   [provider]  CORTIFOAM 90 MG rectal foam  06/20/11 06/07/17  [provider]  diltiazem  (DILACOR XR ) 180 MG 24 hr capsule Take 1 capsule (180 mg total) by mouth daily. 07/13/12 06/07/17  Bensimhon, Toribio SAUNDERS, MD    Family History Family History  Problem Relation Age of Onset    Dementia Mother    Allergies Mother    Emphysema Father    Heart disease Father    Asthma Sister    Emphysema Maternal Grandfather    Heart disease Maternal Grandfather    Stroke Maternal Grandfather    Healthy Child    Colon cancer Neg Hx    Esophageal cancer Neg Hx    Stomach cancer Neg Hx    Rectal cancer Neg Hx     Social History Social History[1]   Allergies   Ibuprofen and Ozempic  (0.25 or 0.5 mg-dose) [semaglutide (0.25 or 0.5mg -dos)]   Review of Systems Review of Systems  Respiratory:  Positive for cough.    Per HPI  Physical Exam Triage Vital Signs ED Triage Vitals  Encounter Vitals Group     BP 04/14/24 1719 (!) 155/76     Girls Systolic BP Percentile --      Girls Diastolic BP Percentile --      Boys Systolic BP Percentile --      Boys Diastolic BP Percentile --      Pulse Rate 04/14/24 1719 93     Resp 04/14/24 1719 17     Temp 04/14/24 1719 99.2 F (37.3 C)     Temp Source 04/14/24 1719 Oral     SpO2 04/14/24 1719 93 %     Weight --      Height --      Head Circumference --      Peak Flow --      Pain Score 04/14/24 1717 8     Pain Loc --      Pain Education --      Exclude from Growth Chart --    No data found.  Updated Vital Signs BP (!) 155/76 (BP Location: Right Arm)   Pulse 93   Temp 99.2 F (37.3 C) (Oral)   Resp 17   SpO2 93%   Visual Acuity Right Eye Distance:   Left Eye Distance:   Bilateral Distance:    Right Eye Near:   Left Eye Near:    Bilateral Near:     Physical Exam Vitals and nursing note reviewed.  Constitutional:      General: He is awake. He is not in acute distress.    Appearance: Normal appearance. He is well-developed and well-groomed. He is not ill-appearing.  HENT:     Right Ear: Tympanic membrane, ear canal and external ear normal.     Left Ear: Tympanic membrane, ear canal and external ear normal.     Nose: Congestion and rhinorrhea present.     Mouth/Throat:     Mouth: Mucous membranes are moist.      Pharynx: Posterior oropharyngeal erythema and postnasal drip present. No oropharyngeal exudate.  Cardiovascular:     Rate and Rhythm: Normal rate and regular rhythm.  Pulmonary:     Effort: Pulmonary effort is normal.     Breath sounds: Normal breath sounds.     Comments: Strong, nonproductive, congested cough noted on exam Skin:    General: Skin is warm and dry.  Neurological:     Mental Status: He is alert.  Psychiatric:        Behavior: Behavior is cooperative.      UC Treatments / Results  Labs (all labs ordered are listed, but only abnormal results are displayed) Labs Reviewed  POC COVID19/FLU A&B COMBO - Normal    EKG   Radiology DG Chest 2 View Result Date: 04/14/2024 CLINICAL DATA:  Cough. EXAM: CHEST - 2 VIEW COMPARISON:  Chest radiograph dated 01/07/2023. FINDINGS: No focal consolidation, effusion or pneumothorax. An 8 mm nodule in the left mid lung field similar to CT dating back to 12/27/2019. Stable cardiac silhouette. Old right rib fractures. No acute osseous pathology. IMPRESSION: No active cardiopulmonary disease. Electronically Signed   By: Vanetta Chou M.D.   On: 04/14/2024 18:35    Procedures Procedures (including critical care time)  Medications Ordered in UC Medications - No data to display  Initial Impression / Assessment and Plan / UC Course  I have reviewed the triage vital signs and the nursing notes.  Pertinent labs & imaging results that were available during my care  of the patient were reviewed by me and considered in my medical decision making (see chart for details).     Patient is overall well-appearing.  Vitals are overall stable.  Heart and lung sounds normal however patient does have a strong, nonproductive congested cough on exam.  COVID and flu testing negative.  Ordered chest x-ray to rule out underlying pneumonia.  Independently interpreted these images and there is no active cardiopulmonary disease and lung nodules appear  to be similar to previous imaging.  Radiology report confirms this.  Suspect symptoms are likely related to a viral bronchitis.  Due to various lung comorbidities and previous history of pneumonia making patient high risk for developing pneumonia again decided to start patient on azithromycin  and amoxicillin  for prevention of pneumonia.    Prescribed prednisone  burst as well to assist with shortness of breath prescribed Tessalon  as needed for cough.  Discussed over-the-counter medications for cough.  Discussed when to use albuterol  inhaler.  Discussed follow-up, return, and strict ER precautions. Final Clinical Impressions(s) / UC Diagnoses   Final diagnoses:  Acute cough  Viral respiratory illness     Discharge Instructions      Your x-ray is negative for any active pneumonia at this time. As discussed I believe your symptoms are likely related to a viral bronchitis. Start taking 2 tablets of prednisone  once daily for 5 days for treatment of this. Due to your various lung comorbidities I have prescribed antibiotics in attempt to prevent any development of pneumonia. Start taking azithromycin  by taking 2 tablets today and 1 tablet on the for many days. Also start taking amoxicillin  twice daily for 7 days. I prescribed Tessalon  that you can take every 8 hours as needed for cough. You can also take plain Mucinex  (guaifenesin ) to help with cough and congestion as well. Use your albuterol  inhaler every 4-6 hours as needed for shortness of breath or wheezing. Follow-up with your primary care provider or return here as needed. If you develop severe difficulty breathing, severe chest pain, persistent fevers, or severe weakness please seek immediate medical treatment in the emergency department.     ED Prescriptions     Medication Sig Dispense Auth. Provider   benzonatate  (TESSALON ) 100 MG capsule Take 1 capsule (100 mg total) by mouth every 8 (eight) hours. 21 capsule Johnie, Dany Harten A,  NP   predniSONE  (DELTASONE ) 20 MG tablet Take 2 tablets (40 mg total) by mouth daily for 5 days. 10 tablet Johnie Flaming A, NP   azithromycin  (ZITHROMAX  Z-PAK) 250 MG tablet Take 2 pills (500mg ) first day and one pill (250mg ) the remaining 4 days. 6 tablet Johnie Flaming A, NP   amoxicillin  (AMOXIL ) 500 MG capsule Take 1 capsule (500 mg total) by mouth 2 (two) times daily for 7 days. 14 capsule Johnie Flaming A, NP      PDMP not reviewed this encounter.    [1]  Social History Tobacco Use   Smoking status: Former    Current packs/day: 0.00    Average packs/day: 1 pack/day for 35.0 years (35.0 ttl pk-yrs)    Types: Cigarettes    Start date: 04/26/1966    Quit date: 04/26/2001    Years since quitting: 22.9   Smokeless tobacco: Former    Types: Chew    Quit date: 06/25/1991   Tobacco comments:    quit in 2005  Vaping Use   Vaping status: Never Used  Substance Use Topics   Alcohol  use: Not Currently    Comment: 1  beer/day   Drug use: No     Johnie Rumaldo LABOR, NP 04/14/24 8147  "

## 2024-04-16 ENCOUNTER — Ambulatory Visit: Admitting: Speech Pathology

## 2024-04-18 ENCOUNTER — Ambulatory Visit

## 2024-05-02 ENCOUNTER — Ambulatory Visit

## 2024-05-02 VITALS — BP 120/62 | HR 79 | Temp 97.6°F | Ht 71.5 in | Wt 228.6 lb

## 2024-05-02 DIAGNOSIS — J449 Chronic obstructive pulmonary disease, unspecified: Secondary | ICD-10-CM | POA: Diagnosis not present

## 2024-05-02 DIAGNOSIS — Z87891 Personal history of nicotine dependence: Secondary | ICD-10-CM

## 2024-05-02 DIAGNOSIS — G4733 Obstructive sleep apnea (adult) (pediatric): Secondary | ICD-10-CM

## 2024-05-02 DIAGNOSIS — R918 Other nonspecific abnormal finding of lung field: Secondary | ICD-10-CM | POA: Diagnosis not present

## 2024-05-02 NOTE — Patient Instructions (Signed)
" °  VISIT SUMMARY: You had a follow-up visit to check on your obstructive sleep apnea and chronic obstructive pulmonary disease (COPD). Your sleep apnea is well-managed with your CPAP therapy, and your COPD is under control with your current medication. You have been doing well overall, with no major issues reported.  YOUR PLAN: -OBSTRUCTIVE SLEEP APNEA: Obstructive sleep apnea is a condition where your breathing stops and starts repeatedly during sleep. Your condition is well-managed with CPAP therapy, and you are compliant with its use. Continue using your CPAP with the nasal mask, clean the tubing with mild soap and water every couple of weeks, and clean the mask with wipes regularly.  -CHRONIC OBSTRUCTIVE PULMONARY DISEASE (COPD): COPD is a chronic inflammatory lung disease that obstructs airflow from the lungs, often due to smoking. Your COPD is well-controlled with Trelegy, and you have not needed to use albuterol  recently. Continue taking Trelegy as prescribed, and maintain regular exercise and physical activity. We will schedule a follow-up in one year.  INSTRUCTIONS: Please continue with your current treatments and follow the cleaning recommendations for your CPAP equipment. We will see you again in one year for a follow-up on your COPD. If you have any new symptoms or concerns, please contact our office.                      Contains text generated by [employer]bridge.                                 Contains text generated by [employer]bridge.   "

## 2024-05-02 NOTE — Progress Notes (Signed)
 "  Pulmonology Office Visit   Subjective:  Patient ID: Ricardo Cooper, male    DOB: [birth year]  MRN: [id number]  Referred by: [name]., MD  CC:  Chief Complaint  Patient presents with   Sleep Apnea    Sleep is good.  Using CPAP nightly.    HPI Ricardo Cooper is a 79 y.o. male with COPD, OSA, lung nodules, UC and A-fib, parkinsonism.  Last seen by NP Madelin Birk.  Here for follow-up. Recently went to ED [April 14, 2024] for generalized body ache and cough.  Flu and COVID-negative.  Chest x-ray negative. Rx w steroids and abx.   Respective notes from provider reviewed as appropriate to gather relevant information for patient care.   Discussed the use of AI scribe software for clinical note transcription with the patient, who gave verbal consent to proceed.  History of Present Illness   Ricardo Cooper is a 79 year old male with obstructive sleep apnea and COPD who presents for a follow-up visit.  He manages his obstructive sleep apnea with CPAP therapy, using a nose filler mask effectively. He maintains good compliance, cleans the humidifier weekly, but does not clean the tubing regularly. He has not experienced any issues with the CPAP and is here for routine follow-up to ensure continued supply of prescriptions and supplies.  He can walk on flat surfaces without shortness of breath and manage two to three flights of stairs before needing to rest. He quit smoking in the early 2000s after smoking half a pack a day for nearly forty years. He has not used albuterol  in the past six months, even during a recent illness.  He had a recent emergency department visit on April 22, 2024, for flu-like symptoms, which were diagnosed as viral bronchitis. COVID and flu tests were negative. He was treated with a Z-Pak, amoxicillin , prednisone , and cough pearls, which improved his symptoms. He completed the course of prednisone , taking 40 mg daily for five days.  His  social history includes working as a pharmacist, community and in park maintenance, with significant exposure to chemicals and fumes. [place], which contributed to his smoking history. He has no pets at home.      OSA history: Dx in 09 now on CPAP.   Lung Health: Functional status: ET unlimited. More SOB with walking stairs 2-3 flights.  Smoking: quit 2003, 1-1.5pk x 40 yrs.  Occupational exposure/pets: raising tobacco and working in park maintenance.  Lot of chemical exposure on lawn maintenance [place]. No pets.   PAP download compliance data: [employer] December 2025-June 2026 [employer] ResMed 11 Pressure: 8-15, median 10.2, max 13. Hours of usage: 7 hours 48-minute Days used >4hr: 30/30 Leak: Minimal. AHI: 0.9.  PRIOR TESTS and IMAGING: PSG/HSAT: NPSG 11/22/07- AHI 36/ hr, desaturation to 85%, body weight 215 lbs   CT Chest: CT chest November 2021 no evidence of acute process, no new or suspicious nodules. Left upper and right upper lung nodules have resolved. Unchanged 7.5 mm left upper lobe lung nodule unchanged since 2015 consistent with a benign etiology  Chest x-ray December 2025 some interstitial changes but no acute abnormality.  PFT: PFT May 2025 mild obstruction, air trapping seen, normal DLCO.      No data to display          Allergies: Ibuprofen and Ozempic  (0.25 or 0.5 mg-dose) [semaglutide (0.25 or 0.5mg -dos)] Current Medications[1] Past Medical History:  Diagnosis Date  Allergic rhinitis    Allergy    Cataract    beginnings of cataracts    CHF (congestive heart failure) (HCC)    COPD (chronic obstructive pulmonary disease) (HCC)    Diverticulosis    GERD (gastroesophageal reflux disease)    no issues with CPAP-1999   HTN (hypertension)    Hyperlipidemia    Obesity    Obstructive sleep apnea on CPAP    Paroxysmal atrial fibrillation (HCC) 2005   past trauma and infection   Skin cancer    basel cell on  nose,arm,leg   Sleep apnea    with CPAP   Ulcerative colitis    Ulcerative colitis, left sided (HCC)    Past Surgical History:  Procedure Laterality Date   BACK SURGERY  2005   BELPHAROPTOSIS REPAIR     bilateral   COLONOSCOPY  2013   Dr. Albertus    FETAL SURGERY FOR CONGENITAL HERNIA  2001   infection in chest that had to be cleaned out     Staph Infection   POLYPECTOMY     SKIN CANCER EXCISION     squamous and basal cell   TONSILLECTOMY     tubular adenoma  2013   UMBILICAL HERNIA REPAIR     VASECTOMY     Family History  Problem Relation Age of Onset   Dementia Mother    Allergies Mother    Emphysema Father    Heart disease Father    Asthma Sister    Emphysema Maternal Grandfather    Heart disease Maternal Grandfather    Stroke Maternal Grandfather    Healthy Child    Colon cancer Neg Hx    Esophageal cancer Neg Hx    Stomach cancer Neg Hx    Rectal cancer Neg Hx    Social History   Socioeconomic History   Marital status: Married    Spouse name: Not on file   Number of children: 2   Years of education: Not on file   Highest education level: Not on file  Occupational History   Occupation: retired    Comment: pharmacist, community  Tobacco Use   Smoking status: Former    Current packs/day: 0.00    Average packs/day: 1 pack/day for 35.0 years (35.0 ttl pk-yrs)    Types: Cigarettes    Start date: 04/26/1966    Quit date: 04/26/2001    Years since quitting: 23.0   Smokeless tobacco: Former    Types: Chew    Quit date: 06/25/1991   Tobacco comments:    quit in 2005  Vaping Use   Vaping status: Never Used  Substance and Sexual Activity   Alcohol  use: Not Currently    Comment: 1 beer/day   Drug use: No   Sexual activity: Not on file  Other Topics Concern   Not on file  Social History Narrative   Right handed    Social Drivers of Health   Tobacco Use: Medium Risk (05/02/2024)   Patient History    Smoking Tobacco Use: Former    Smokeless Tobacco  Use: Former    Passive Exposure: Not on Stage Manager: Not on [employer] Insecurity: Not on file  Transportation Needs: Not on file  Physical Activity: Not on file  Stress: Not on file  Social Connections: Not on file  Intimate Partner Violence: Not on file  Depression (PHQ2-9): Not on file  Alcohol  Screen: Not on file  Housing: Not on file  Utilities:  Not on file  Health Literacy: Not on file       Objective:  BP 120/62   Pulse 79   Temp 97.6 F (36.4 C) (Temporal)   Ht 5' 11.5 (1.816 m)   Wt 228 lb 9.6 oz (103.7 kg)   SpO2 96% Comment: room air  BMI 31.44 kg/m  BMI Readings from Last 3 Encounters:  05/02/24 31.44 kg/m  03/15/24 32.72 kg/m  11/15/23 32.55 kg/m    Physical Exam: Physical Exam   MEASUREMENTS: BMI- 31.44. ENT: Normal mucosa. No hypertrophy of inferior turbinates. Tonsils are normal sized. Modified Mallampati score is normal. PULMONARY: Lungs clear to auscultation bilaterally, no adventitious breath sounds. CARDIOVASCULAR: Regular rate and rhythm, S1 S2 normal, no murmurs. ABDOMEN: Abdomen soft, nontender. Bowel sounds are normal. EXTREMITIES: No peripheral edema noted.       Diagnostic Review:  Last metabolic panel Lab Results  Component Value Date   GLUCOSE 127 (H) 08/17/2023   NA 141 08/17/2023   K 4.0 Hemolysis seen.SABRA 08/17/2023   CL 103 08/17/2023   CO2 31 08/17/2023   BUN 16 08/17/2023   CREATININE 0.93 08/17/2023   GFR 79.82 08/17/2023   CALCIUM 9.3 08/17/2023   PROT 7.1 08/17/2023   ALBUMIN 4.4 08/17/2023   BILITOT 0.7 08/17/2023   ALKPHOS 46 08/17/2023   AST 27 08/17/2023   ALT 43 08/17/2023         Assessment & Plan:   Assessment & Plan OSA (obstructive sleep apnea)     COPD GOLD II     Multiple lung nodules Stable.  No follow-up needed.      Assessment and Plan    Obstructive sleep apnea Well-managed with CPAP therapy. Compliance is good with a residual AHI of 0.9. No current concerns  with CPAP use. - Continue CPAP therapy with nasal mask. - Advised cleaning CPAP tubing with mild soap and water every couple of weeks. - Recommended cleaning the mask with wipes regularly.  Chronic obstructive pulmonary disease Well-controlled with Trelegy. Lung function tests indicate mild COPD, likely due to smoking history. No recent use of albuterol , indicating good control of symptoms. - Continue Trelegy for COPD management. - Encouraged regular exercise and physical activity. - Scheduled follow-up in one year.      Notes from Madelin Birk November 2023 and ED visit from December 2025 reviewed as to gather relevant information for patient care and formulating plan.  He was counselled about not driving while drowsy which is common side effect of sleep related disorders.   Return in about 1 year (around 05/02/2025).   I personally spent a total of 30 minutes in the care of the patient today including preparing to see the patient, getting/reviewing separately obtained history, performing a medically appropriate exam/evaluation, counseling and educating, placing orders, documenting clinical information in the EHR, independently interpreting results, and communicating results.   Shanieka Blea, MD      [1]  Current Outpatient Medications:    albuterol  (VENTOLIN  HFA) 108 (90 Base) MCG/ACT inhaler, Inhale 1-2 puffs into the lungs every 6 (six) hours as needed., Disp: 8 g, Rfl: 3   carbidopa -levodopa  (SINEMET  IR) 25-100 MG tablet, Take 1 tablet by mouth 3 (three) times daily. 8am/noon/4pm, Disp: 270 tablet, Rfl: 1   ezetimibe (ZETIA) 10 MG tablet, Take 10 mg by mouth daily., Disp: , Rfl:    fish oil-omega-3 fatty acids 1000 MG capsule, Take 2 g by mouth daily., Disp: , Rfl:    fluorouracil (EFUDEX) 5 % cream, Apply  topically daily., Disp: , Rfl:    Fluticasone -Umeclidin-Vilant (TRELEGY ELLIPTA ) 100-62.5-25 MCG/ACT AEPB, TAKE 1 PUFF BY MOUTH EVERY DAY, Disp: 60 each, Rfl: 6   folic acid   (FOLVITE ) 1 MG tablet, Take 1 tablet (1 mg total) by mouth daily., Disp: 90 tablet, Rfl: 3   losartan -hydrochlorothiazide  (HYZAAR) 50-12.5 MG tablet, TAKE 1 TABLET BY MOUTH EVERY DAY, Disp: 90 tablet, Rfl: 0   meloxicam (MOBIC) 7.5 MG tablet, Take 7.5 mg by mouth daily as needed for pain., Disp: , Rfl:    mercaptopurine  (PURINETHOL ) 50 MG tablet, Take 2 tablets (100 mg total) by mouth daily. Give on an empty stomach 1 hour before or 2 hours after meals. Caution: Chemotherapy., Disp: 180 tablet, Rfl: 3   Multiple Vitamins-Minerals (PRESERVISION AREDS 2) CAPS, take 1 po BID Oral, Disp: , Rfl:    pravastatin (PRAVACHOL) 40 MG tablet, Take 40 mg by mouth daily., Disp: , Rfl:    silodosin (RAPAFLO) 8 MG CAPS capsule, Take 8 mg by mouth daily with breakfast., Disp: , Rfl:    sulfaSALAzine  (AZULFIDINE ) 500 MG tablet, Take 2 tablets (1,000 mg total) by mouth 3 (three) times daily., Disp: 540 tablet, Rfl: 3   tadalafil (CIALIS) 5 MG tablet, Take 5 mg by mouth at bedtime., Disp: , Rfl:    TIADYLT  ER 180 MG 24 hr capsule, TAKE 1 CAPSULE BY MOUTH EVERY DAY, Disp: 90 capsule, Rfl: 3   Vitamin D, Cholecalciferol, 25 MCG (1000 UT) CAPS, Take 1 capsule by mouth daily., Disp: , Rfl:    benzonatate  (TESSALON ) 100 MG capsule, Take 1 capsule (100 mg total) by mouth every 8 (eight) hours. (Patient not taking: Reported on 05/02/2024), Disp: 21 capsule, Rfl: 0  "

## 2024-05-02 NOTE — Assessment & Plan Note (Signed)
 Stable. No follow up needed.

## 2024-05-02 NOTE — Assessment & Plan Note (Signed)
 SABRA

## 2024-05-07 ENCOUNTER — Ambulatory Visit: Attending: Neurology

## 2024-05-07 DIAGNOSIS — R471 Dysarthria and anarthria: Secondary | ICD-10-CM | POA: Insufficient documentation

## 2024-05-07 NOTE — Therapy (Signed)
 "  OUTPATIENT SPEECH LANGUAGE PATHOLOGY PARKINSON'S TREATMENT   Patient Name: Ricardo Cooper MRN: [id number] DOB:February 01, 1946, 79 y.o., male Today's Date: 05/07/2024  PCP: Loreli Elsie BIRCH, MD REFERRING PROVIDER: Evonnie Asberry RAMAN, DO  END OF SESSION:  End of Session - 05/07/24 1530     Visit Number 8    Number of Visits 9    Date for Recertification  05/16/24    SLP Start Time 1453    SLP Stop Time  1530    SLP Time Calculation (min) 37 min    Activity Tolerance Patient tolerated treatment well               Past Medical History:  Diagnosis Date   Allergic rhinitis    Allergy    Cataract    beginnings of cataracts    CHF (congestive heart failure) (HCC)    COPD (chronic obstructive pulmonary disease) (HCC)    Diverticulosis    GERD (gastroesophageal reflux disease)    no issues with CPAP-1999   HTN (hypertension)    Hyperlipidemia    Obesity    Obstructive sleep apnea on CPAP    Paroxysmal atrial fibrillation (HCC) 2005   past trauma and infection   Skin cancer    basel cell on nose,arm,leg   Sleep apnea    with CPAP   Ulcerative colitis    Ulcerative colitis, left sided (HCC)    Past Surgical History:  Procedure Laterality Date   BACK SURGERY  2005   BELPHAROPTOSIS REPAIR     bilateral   COLONOSCOPY  2013   Dr. Albertus    FETAL SURGERY FOR CONGENITAL HERNIA  2001   infection in chest that had to be cleaned out     Staph Infection   POLYPECTOMY     SKIN CANCER EXCISION     squamous and basal cell   TONSILLECTOMY     tubular adenoma  2013   UMBILICAL HERNIA REPAIR     VASECTOMY     Patient Active Problem List   Diagnosis Date Noted   History of colonic polyps 04/28/2022   Obesity (BMI 30-39.9) 07/02/2021   CAP (community acquired pneumonia) 03/18/2021   Plantar fasciitis 01/18/2020   Pain due to onychomycosis of toenails of both feet 09/18/2019   Multiple lung nodules 06/07/2018   Influenza with respiratory manifestation 06/06/2015    Ulcerative colitis with complication (HCC) 05/03/2011   PURE HYPERCHOLESTEROLEMIA 03/23/2010   PRECORDIAL PAIN 03/23/2010   CHEST TIGHTNESS-PRESSURE-OTHER 03/18/2010   DYSPNEA 11/19/2008   HYPERLIPIDEMIA 02/01/2008   HYPERTENSION 02/01/2008   ATRIAL FIBRILLATION 02/01/2008   Allergic rhinitis 02/01/2008   COPD GOLD II 02/01/2008   Obstructive sleep apnea 02/01/2008    ONSET DATE: 12/06/2023 (referral date)  REFERRING DIAG: G20.A1 (ICD-10-CM) - Parkinson's disease without dyskinesia or fluctuating manifestations (HCC) R49.8 (ICD-10-CM) - Hypophonia  THERAPY DIAG:  Dysarthria and anarthria  Rationale for Evaluation and Treatment: Rehabilitation  SUBJECTIVE:   SUBJECTIVE STATEMENT: I didn't do as much as I should - we had 2 funerals Pt accompanied by: self   PERTINENT HISTORY: PMH: Parkinsonism (recently diagnosed 10/2023), CHF, COPD, HTN, HLD, hx of skin cancer, hx of back injury in 2005  Per Dr. Evonnie: left hand rest tremor for 3 months. Notes from primary care indicate that patient has had bradykinesia with shuffling gait since 2023, now with more recent tremor.   PAIN:  Are you having pain? No   PATIENT REPORTED OUTCOME MEASURES (PROM): Communication Effectiveness Survey: 29 He  rated a 1 or not at all effective for having a conversation at a distance; rated a 2 for conversing at home, in a noisy gathering and in the car. Rated a 3 for conversing with strangers and over the phone                                                                                                                          TREATMENT DATE:  05/07/24: Pt notes that he was sick over the holidays. Pt's practice of HEP was impacted by illness, mostly due to increase coughing. Pt cleared his throat >10x during session, with occasional coughing (most likely due to recent illness). SLP reviewed throat clearing strategies (taking sips of water, hard swallows, blowing out on shhhhhh followed by abdominal  breath) to optimize pt's vocal hygiene. Pt displayed increased use of throat clearing strategies during session after this review. Targeted volume and intelligibility using Speak Out! Lesson 7. Pt required occasional verbal cues, modeling, for volume and breath support.   Pt averages the following volume levels:  Sustained AH: 86 dB  Counting: 83 dB  Reading (phrases): 79 dB  Cognitive Exercise: 77 dB Required occasional verbal cues, modeling for carryover of intent /volume answering simple questions following structured practice. Pt also required occasional min A for reducing volume on conversation exercise due to increased volume (>85 dB). During unstructured conversation, pt averaged ~68 dB; however, pt displayed moments of >70 dB when given occasional min A for using intent when speaking. Pt is making progress with dysarthria goals. Plan is to continue SPEAK OUT! Program and unstructured conversation tx.    04/11/24: Pt continues to practice HEP daily; although, pt noted that he took one day off. Pt only cleared his throat once during session. SLP provided rare min A for taking a sip of water instead of throat clear.  Targeted volume and intelligibility using Speak Out! Lesson 6. Pt required rare min verbal cues, modeling, for volume and breath support.   Pt averages the following volume levels:  Sustained AH: 86 dB.  Counting: 85 dB  Reading (phrases): 80 dB  Cognitive Exercise: 76 dB Required rare min verbal cues, modeling for carryover of intent /volume answering simple questions following structured practice. SLP challenged pt to utilize intent during conversation. Pt was successful with utilizing a vocal volume of >68 dB at least 70% of the time. Pt required frequent min A for utilizing intent during conversation. SLP educated pt about using sticky notes as visual cues for using intent during daily tasks, when speaking. Pt is agreeable and willing to try using sticky notes as visual  cues. Plan is to continue SPEAKOUT! Program and integrating speech intent into conversation for generalization.    04/09/24: Pt seen today with low and hoarse voice. Pt reported he knows what he needs to do in terms of communicating with intent. Expressed I just need to do it more intentionally. SLP provided encouragement and reviewed order of Speak Out exercises  for HEP. Pt verbalized understanding through teach back. Targeted volume and intelligibility using Speak Out! Lesson 5. Pt required min verbal cues, modeling, for volume and breath support.   Pt averages the following volume levels:  Sustained AH: 87 dB   Counting: 85 dB  Reading (phrases): 77 dB  Cognitive Exercise: 65 dB Required min verbal cues, modeling for carryover of intent /volume answering simple questions following structured practice.  Patient continues to benefit from increased reminders to increase vocal loudness during conversational speech and communicating with intent. Demonstrated reduced self monitoring as session progressed likely secondary to fatigue.   04/04/24: Pt enters with low and hoarse voice. Pt is practicing every other day due to busy schedule. Pt tries to practice during other tasks, especially the warm-up, sustained ah's, and glide exercises. SLP educated pt on importance of incorporating intent into daily conversation for carry-over and generalization. Targeted volume and intelligibility using Speak Out! Lesson 4. Pt required occasional min verbal cues, modeling, for volume and breath support.   Pt averages the following volume levels:  Sustained AH: 88 dB  Counting:85 dB  Reading (phrases): 81 dB  Cognitive Exercise: 78 dB Required occasional min verbal cues, modeling for carryover of intent /volume answering simple questions following structured practice. Pt requiree rare min A for not exceeding dB values during structured exercises. Plan is to continue SPEAK OUT! Program and incorporating idea  of intent into conversation-level speech.   12/8/25[name] enters with low, hoarse voice. He continues to complete online home practice sessions. He has not accessed the E Library - demonstrated the difference between the 2 and demonstrated how to navigate to the [employer]. Today, targeted intelligibility with SPEAK OUT! E Library, A Year of Intent December week 2 - With initial modeling, [name] completed exercises accurately - he averaged 87-90dB on sustained Ah; 84dB on counting and 83dB on reading. Feedback required to ensure [name] is not exceeding recommended dB with verbal cues and modeling. In conversation he averaged 69-70dB with usual mod verbal, gesture and modeling cues to maintain intentional speech.   03/28/24: Pt notes that he completed his online HEP yesterday with no issues. Pt states that speaking with intent still feels unusual; however, he feels that his voice is slightly stronger, especially after practicing at home.  Targeted volume and intelligibility using Speak Out! Lesson 2 and 3. Pt required frequent min verbal cues, modeling, for volume and breath support.   Pt averages the following volume levels:  Sustained AH: 84 dB   Counting: 87.5 dB  Reading (phrases): 86.5 dB  Cognitive Exercise: 80 dB Required occasional min verbal cues, modeling for carryover of intent /volume answering simple questions following structured practice. SLP provided rare min A for slightly decreasing pt's volume during conversation/cognitive task. Pt was advised to utilize intent regardless of volume level during speech production. Pt is making progress with speech goals and is compliant with HEP. Plan is to continue speech exercises and asking pt about HEP compliance to optimize outcomes.    03/26/24: [name] and Sharman have watched the Learn about Parkinson's video and accessed home practice reading material. They have not accessed E Library or completed an online home practice session. I assigned this  for [facility] today.  Targeted volume and intelligibility using Speak Out! Lesson December Week 1 in the Year of Intent workbook ([employer]). Pt required usual min verbal cues, modeling initially, for intelligibility. After initial modeling and chorale practice, cues were faded to occasional min A Pt averages the  following volume levels:  Sustained AH: 83dB  Counting:80dB  Reading (phrases): 78dB  Cognitive Exercise: 74dB Required usual mod verbal cues, modeling for carryover of intent /volume answering simple questions following structured practice.    03/21/24: Evaluation completed. Initiated training in SPEAK OUT! HEP - Workbook Lesson one - with usual mod modeling and instruction, [name] averaged 77 on loud Ah, 83dB on glides, 88 on counting, indicating improved understanding of speaking with intent as Lesson progressed. Reviewed workbook and demonstrated navigation of [employer] website to locate the learn about Parkinson's video they are required to watch and an on line home practice session. Requested booklet and E Library access from website.    PATIENT EDUCATION: Education details: HEP for dysarthria, compensations for dysarthria, general swallowing precautions Person educated: Patient Education method: Explanation, Demonstration, Verbal cues, and Handouts Education comprehension: needs further education  HOME EXERCISE PROGRAM: SPEAK OUT!    GOALS: Goals reviewed with patient? Yes  SHORT TERM GOALS: Target date: 04/04/24  Pt will complete HEP for dysarthria with occasional min A Baseline: Goal status: ONGOING  2.  Pt will complete 2 on line home practice sessions or E Library Lessons as part of HEP Baseline:  Goal status: ONGOING  3.  Pt will average 72dB 18/20 sentences Baseline:  Goal status: ONGOING  4.  Pt and family will watch the Learn About Parkinson's video on the [employer] website Baseline:  Goal status: ONGOING  5.  Pt will average  68dB and clear phonation over 5 minute conversation with occasional mion A Baseline:  Goal status: ONGOING  6  LONG TERM GOALS: Target date: 05/16/24  Pt will complete HEP with rare min A Baseline:  Goal status: ONGOING  2.  Pt will complete HEP 5/7 days with rare min A Baseline:  Goal status: ONGOING  3.  Pt will complete 2 E Library lessons and 2 on line home practice sessions for HEP Baseline:  Goal status: ONGOING  4.  Pt will average 70dB over 12  minute conversation with clear phonation and occasional min A Baseline:  Goal status: ONGOING  5.  Pt will verbalize specific plan for continuing HEP upon d/c Baseline:  Goal status: ONGOING  6.  Pt will improve score on PROM - Communicative Effectiveness Survey Baseline:  Goal status: ONGOING  ASSESSMENT:  CLINICAL IMPRESSION: Patient is a 79 y.o. male who was seen today for moderate hypokinetic dysarthria. Volume consistently low and hoarse. He reports hoarseness is due to COPD inhaler, so we will keep this in mind. He reports he is unable to be heard across a room and that his family frequently ask tell him Say it again due to reduced intelligibility. I recommend skilled ST to maximize intelligibility for safety, independence, and to reduce caregiver burden and improve social participation. 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 Degrees from [employer].   OBJECTIVE IMPAIRMENTS: Objective impairments include dysarthria. These impairments are limiting patient from effectively communicating at home and in community.Factors affecting potential to achieve goals and functional outcome are medical prognosis.. Patient will benefit from skilled SLP services to address above impairments and improve overall function.  REHAB POTENTIAL: Good  PLAN:  SLP FREQUENCY: 1-2x/week  SLP DURATION: 8 weeks  PLANNED  INTERVENTIONS: Aspiration precaution training, Diet toleration management , Environmental controls, Cueing hierachy, Cognitive reorganization, Internal/external aids, Functional tasks, Multimodal communication approach, SLP  instruction and feedback, Compensatory strategies, Patient/family education, [phone] Treatment of speech (30 or 45 min) , [id number]- Speech Eval Sound Prod, Articulate, Phonological, and MBSS if indicated  Koleen [place], [place], CF-SLP 05/07/2024, 4:36 PM      "

## 2024-05-07 NOTE — Patient Instructions (Signed)
 Throat Clearing Strategies:   Take a sip of water. Swallow hard. Blow out on a shhhhhhh, followed by a deep breath. Say ah like in the ah exercise.

## 2024-05-14 ENCOUNTER — Ambulatory Visit: Admitting: Speech Pathology

## 2024-05-14 ENCOUNTER — Encounter: Payer: Self-pay | Admitting: Speech Pathology

## 2024-05-14 DIAGNOSIS — R471 Dysarthria and anarthria: Secondary | ICD-10-CM

## 2024-05-14 NOTE — Therapy (Signed)
 "  OUTPATIENT SPEECH LANGUAGE PATHOLOGY PARKINSON'S TREATMENT & DISCHARGE SUMMARY   Patient Name: Ricardo Cooper MRN: [id number] DOB:1946-02-25, 79 y.o., male Today's Date: 05/14/2024  PCP: Loreli Elsie BIRCH, MD REFERRING PROVIDER: Evonnie Asberry RAMAN, DO  END OF SESSION:  End of Session - 05/14/24 1321     Visit Number 9    Number of Visits 9    Date for Recertification  05/16/24    SLP Start Time 1316    SLP Stop Time  1400    SLP Time Calculation (min) 44 min    Activity Tolerance Patient tolerated treatment well               Past Medical History:  Diagnosis Date   Allergic rhinitis    Allergy    Cataract    beginnings of cataracts    CHF (congestive heart failure) (HCC)    COPD (chronic obstructive pulmonary disease) (HCC)    Diverticulosis    GERD (gastroesophageal reflux disease)    no issues with CPAP-1999   HTN (hypertension)    Hyperlipidemia    Obesity    Obstructive sleep apnea on CPAP    Paroxysmal atrial fibrillation (HCC) 2005   past trauma and infection   Skin cancer    basel cell on nose,arm,leg   Sleep apnea    with CPAP   Ulcerative colitis    Ulcerative colitis, left sided (HCC)    Past Surgical History:  Procedure Laterality Date   BACK SURGERY  2005   BELPHAROPTOSIS REPAIR     bilateral   COLONOSCOPY  2013   Dr. Albertus    FETAL SURGERY FOR CONGENITAL HERNIA  2001   infection in chest that had to be cleaned out     Staph Infection   POLYPECTOMY     SKIN CANCER EXCISION     squamous and basal cell   TONSILLECTOMY     tubular adenoma  2013   UMBILICAL HERNIA REPAIR     VASECTOMY     Patient Active Problem List   Diagnosis Date Noted   History of colonic polyps 04/28/2022   Obesity (BMI 30-39.9) 07/02/2021   CAP (community acquired pneumonia) 03/18/2021   Plantar fasciitis 01/18/2020   Pain due to onychomycosis of toenails of both feet 09/18/2019   Multiple lung nodules 06/07/2018   Influenza with respiratory manifestation  06/06/2015   Ulcerative colitis with complication (HCC) 05/03/2011   PURE HYPERCHOLESTEROLEMIA 03/23/2010   PRECORDIAL PAIN 03/23/2010   CHEST TIGHTNESS-PRESSURE-OTHER 03/18/2010   DYSPNEA 11/19/2008   HYPERLIPIDEMIA 02/01/2008   HYPERTENSION 02/01/2008   ATRIAL FIBRILLATION 02/01/2008   Allergic rhinitis 02/01/2008   COPD GOLD II 02/01/2008   Obstructive sleep apnea 02/01/2008    ONSET DATE: 12/06/2023 (referral date)  REFERRING DIAG: G20.A1 (ICD-10-CM) - Parkinson's disease without dyskinesia or fluctuating manifestations (HCC) R49.8 (ICD-10-CM) - Hypophonia  THERAPY DIAG:  Dysarthria and anarthria  Rationale for Evaluation and Treatment: Rehabilitation  SUBJECTIVE:   SUBJECTIVE STATEMENT: I didn't do as much as I should - we had 2 funerals Pt accompanied by: self   PERTINENT HISTORY: PMH: Parkinsonism (recently diagnosed 10/2023), CHF, COPD, HTN, HLD, hx of skin cancer, hx of back injury in 2005  Per Dr. Evonnie: left hand rest tremor for 3 months. Notes from primary care indicate that patient has had bradykinesia with shuffling gait since 2023, now with more recent tremor.   PAIN:  Are you having pain? No   PATIENT REPORTED OUTCOME MEASURES (PROM): Communication Effectiveness  Survey: 29                                                                                                                          TREATMENT DATE:   05/14/24: Pt reports completing HEP 3-5 x a week - He has not accessed E Library but he is completing on line home practice sessions. Targeted intelligibility with E Library A Year of Intent Jan week 4. [name] completed warm up through coughing by memory with rare min A to ensure he can complete HEP even if he can't access his booklet. Reading at moderately complex poetry averaged 73dB with rare min A. Conversation maintained 70dB with rare min A over 15 minutes. Score on PROM improved by 3 points. Provided copy of PROM to allow them to monitor  communication/speech.   05/07/24: Pt notes that he was sick over the holidays. Pt's practice of HEP was impacted by illness, mostly due to increase coughing. Pt cleared his throat >10x during session, with occasional coughing (most likely due to recent illness). SLP reviewed throat clearing strategies (taking sips of water, hard swallows, blowing out on shhhhhh followed by abdominal breath) to optimize pt's vocal hygiene. Pt displayed increased use of throat clearing strategies during session after this review. Targeted volume and intelligibility using Speak Out! Lesson 7. Pt required occasional verbal cues, modeling, for volume and breath support.   Pt averages the following volume levels:  Sustained AH: 86 dB  Counting: 83 dB  Reading (phrases): 79 dB  Cognitive Exercise: 77 dB Required occasional verbal cues, modeling for carryover of intent /volume answering simple questions following structured practice. Pt also required occasional min A for reducing volume on conversation exercise due to increased volume (>85 dB). During unstructured conversation, pt averaged ~68 dB; however, pt displayed moments of >70 dB when given occasional min A for using intent when speaking. Pt is making progress with dysarthria goals. Plan is to continue SPEAK OUT! Program and unstructured conversation tx.    04/11/24: Pt continues to practice HEP daily; although, pt noted that he took one day off. Pt only cleared his throat once during session. SLP provided rare min A for taking a sip of water instead of throat clear.  Targeted volume and intelligibility using Speak Out! Lesson 6. Pt required rare min verbal cues, modeling, for volume and breath support.   Pt averages the following volume levels:  Sustained AH: 86 dB.  Counting: 85 dB  Reading (phrases): 80 dB  Cognitive Exercise: 76 dB Required rare min verbal cues, modeling for carryover of intent /volume answering simple questions following structured  practice. SLP challenged pt to utilize intent during conversation. Pt was successful with utilizing a vocal volume of >68 dB at least 70% of the time. Pt required frequent min A for utilizing intent during conversation. SLP educated pt about using sticky notes as visual cues for using intent during daily tasks, when speaking. Pt is agreeable and willing to try using sticky  notes as visual cues. Plan is to continue SPEAKOUT! Program and integrating speech intent into conversation for generalization.    04/09/24: Pt seen today with low and hoarse voice. Pt reported he knows what he needs to do in terms of communicating with intent. Expressed I just need to do it more intentionally. SLP provided encouragement and reviewed order of Speak Out exercises for HEP. Pt verbalized understanding through teach back. Targeted volume and intelligibility using Speak Out! Lesson 5. Pt required min verbal cues, modeling, for volume and breath support.   Pt averages the following volume levels:  Sustained AH: 87 dB   Counting: 85 dB  Reading (phrases): 77 dB  Cognitive Exercise: 65 dB Required min verbal cues, modeling for carryover of intent /volume answering simple questions following structured practice.  Patient continues to benefit from increased reminders to increase vocal loudness during conversational speech and communicating with intent. Demonstrated reduced self monitoring as session progressed likely secondary to fatigue.   04/04/24: Pt enters with low and hoarse voice. Pt is practicing every other day due to busy schedule. Pt tries to practice during other tasks, especially the warm-up, sustained ah's, and glide exercises. SLP educated pt on importance of incorporating intent into daily conversation for carry-over and generalization. Targeted volume and intelligibility using Speak Out! Lesson 4. Pt required occasional min verbal cues, modeling, for volume and breath support.   Pt averages the  following volume levels:  Sustained AH: 88 dB  Counting:85 dB  Reading (phrases): 81 dB  Cognitive Exercise: 78 dB Required occasional min verbal cues, modeling for carryover of intent /volume answering simple questions following structured practice. Pt requiree rare min A for not exceeding dB values during structured exercises. Plan is to continue SPEAK OUT! Program and incorporating idea of intent into conversation-level speech.   12/8/25[name] enters with low, hoarse voice. He continues to complete online home practice sessions. He has not accessed the E Library - demonstrated the difference between the 2 and demonstrated how to navigate to the [employer]. Today, targeted intelligibility with SPEAK OUT! E Library, A Year of Intent December week 2 - With initial modeling, [name] completed exercises accurately - he averaged 87-90dB on sustained Ah; 84dB on counting and 83dB on reading. Feedback required to ensure [name] is not exceeding recommended dB with verbal cues and modeling. In conversation he averaged 69-70dB with usual mod verbal, gesture and modeling cues to maintain intentional speech.   03/28/24: Pt notes that he completed his online HEP yesterday with no issues. Pt states that speaking with intent still feels unusual; however, he feels that his voice is slightly stronger, especially after practicing at home.  Targeted volume and intelligibility using Speak Out! Lesson 2 and 3. Pt required frequent min verbal cues, modeling, for volume and breath support.   Pt averages the following volume levels:  Sustained AH: 84 dB   Counting: 87.5 dB  Reading (phrases): 86.5 dB  Cognitive Exercise: 80 dB Required occasional min verbal cues, modeling for carryover of intent /volume answering simple questions following structured practice. SLP provided rare min A for slightly decreasing pt's volume during conversation/cognitive task. Pt was advised to utilize intent regardless of volume level during  speech production. Pt is making progress with speech goals and is compliant with HEP. Plan is to continue speech exercises and asking pt about HEP compliance to optimize outcomes.    03/26/24: [name] and Sharman have watched the Learn about Parkinson's video and accessed home practice reading material. They  have not accessed E Library or completed an online home practice session. I assigned this for [facility] today.  Targeted volume and intelligibility using Speak Out! Lesson December Week 1 in the Year of Intent workbook ([employer]). Pt required usual min verbal cues, modeling initially, for intelligibility. After initial modeling and chorale practice, cues were faded to occasional min A Pt averages the following volume levels:  Sustained AH: 83dB  Counting:80dB  Reading (phrases): 78dB  Cognitive Exercise: 74dB Required usual mod verbal cues, modeling for carryover of intent /volume answering simple questions following structured practice.    03/21/24: Evaluation completed. Initiated training in SPEAK OUT! HEP - Workbook Lesson one - with usual mod modeling and instruction, [name] averaged 77 on loud Ah, 83dB on glides, 88 on counting, indicating improved understanding of speaking with intent as Lesson progressed. Reviewed workbook and demonstrated navigation of [employer] website to locate the learn about Parkinson's video they are required to watch and an on line home practice session. Requested booklet and E Library access from website.    PATIENT EDUCATION: Education details: HEP for dysarthria, compensations for dysarthria, general swallowing precautions Person educated: Patient Education method: Explanation, Demonstration, Verbal cues, and Handouts Education comprehension: needs further education  HOME EXERCISE PROGRAM: SPEAK OUT!    GOALS: Goals reviewed with patient? Yes  SHORT TERM GOALS: Target date: 04/04/24  Pt will complete HEP for dysarthria with occasional min  A Baseline: Goal status: MET  2.  Pt will complete 2 on line home practice sessions or E Library Lessons as part of HEP Baseline:  Goal status: MET  3.  Pt will average 72dB 18/20 sentences Baseline:  Goal status: MET  4.  Pt and family will watch the Learn About Parkinson's video on the [employer] website Baseline:  Goal status: MET  5.  Pt will average 68dB and clear phonation over 5 minute conversation with occasional mion A Baseline:  Goal status: MET  6  LONG TERM GOALS: Target date: 05/16/24  Pt will complete HEP with rare min A Baseline:  Goal status: MET  2.  Pt will complete HEP 5/7 days with rare min A Baseline:  Goal status: PARTIALLY MET  3.  Pt will complete 2 E Library lessons and 2 on line home practice sessions for HEP Baseline:  Goal status: PARTIALLY MET (home practice met, E library not met)  4.  Pt will average 70dB over 12  minute conversation with clear phonation and occasional min A Baseline:  Goal status: MET  5.  Pt will verbalize specific plan for continuing HEP upon d/c Baseline:  Goal status: MET  6.  Pt will improve score on PROM - Communicative Effectiveness Survey Baseline:  Goal status: MET  ASSESSMENT:  CLINICAL IMPRESSION: Patient is a 79 y.o. male who was seen today for mild hypokinetic dysarthria. Volume and voice quality has improved to average 70dB in conversation in ST. Both he and Sharman report improved intelligibility in the car, across a room and in social gatherings. He is completing HEP with rare min A to mod I and verbalized need to continue HEP upon  d/c 5/7 days. Goals met, d/c ST - they are in agreement.   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 Degrees from [employer].   OBJECTIVE IMPAIRMENTS: Objective impairments include dysarthria. These impairments are limiting patient from effectively communicating at home and in  community.Factors affecting  potential to achieve goals and functional outcome are medical prognosis.. Patient will benefit from skilled SLP services to address above impairments and improve overall function.  REHAB POTENTIAL: Good  PLAN:  SLP FREQUENCY: 1-2x/week  SLP DURATION: 8 weeks  PLANNED INTERVENTIONS: Aspiration precaution training, Diet toleration management , Environmental controls, Cueing hierachy, Cognitive reorganization, Internal/external aids, Functional tasks, Multimodal communication approach, SLP instruction and feedback, Compensatory strategies, Patient/family education, [phone] Treatment of speech (30 or 45 min) , [id number]- Speech Eval Sound Prod, Articulate, Phonological, and MBSS if indicated   SPEECH THERAPY DISCHARGE SUMMARY  Visits from Start of Care: 9  Current functional level related to goals / functional outcomes: See goals above   Remaining deficits: Hypokinetic dysarthria   Education / Equipment: HEP for dysarthria, compensations for dysarthria   Patient agrees to discharge. Patient goals were met. Patient is being discharged due to meeting the stated rehab goals..    Shannon Balthazar MS, CCC-SLP 05/14/2024, 2:10 PM      "

## 2024-06-13 ENCOUNTER — Ambulatory Visit: Admitting: Emergency Medicine

## 2024-08-07 ENCOUNTER — Ambulatory Visit: Admitting: Occupational Therapy

## 2024-08-07 ENCOUNTER — Ambulatory Visit: Admitting: Physical Therapy

## 2024-09-14 ENCOUNTER — Ambulatory Visit: Admitting: Neurology
